# Patient Record
Sex: Male | Born: 1935 | Race: White | Hispanic: No | Marital: Married | Smoking: Never smoker
Health system: Southern US, Community
[De-identification: ages and names within clinical notes are randomized; demographics above are authoritative.]

## PROBLEM LIST (undated history)

## (undated) DIAGNOSIS — I4891 Unspecified atrial fibrillation: Secondary | ICD-10-CM

## (undated) DIAGNOSIS — I499 Cardiac arrhythmia, unspecified: Secondary | ICD-10-CM

## (undated) DIAGNOSIS — Z973 Presence of spectacles and contact lenses: Secondary | ICD-10-CM

## (undated) DIAGNOSIS — Z950 Presence of cardiac pacemaker: Secondary | ICD-10-CM

## (undated) DIAGNOSIS — M1990 Unspecified osteoarthritis, unspecified site: Secondary | ICD-10-CM

## (undated) DIAGNOSIS — R001 Bradycardia, unspecified: Secondary | ICD-10-CM

## (undated) DIAGNOSIS — I10 Essential (primary) hypertension: Secondary | ICD-10-CM

## (undated) DIAGNOSIS — R413 Other amnesia: Secondary | ICD-10-CM

## (undated) DIAGNOSIS — Z974 Presence of external hearing-aid: Secondary | ICD-10-CM

## (undated) DIAGNOSIS — G4730 Sleep apnea, unspecified: Secondary | ICD-10-CM

## (undated) DIAGNOSIS — E785 Hyperlipidemia, unspecified: Secondary | ICD-10-CM

## (undated) HISTORY — PX: TONSILLECTOMY: SUR1361

## (undated) HISTORY — PX: CARDIOVERSION: SHX1299

## (undated) HISTORY — DX: Other amnesia: R41.3

## (undated) HISTORY — DX: Bradycardia, unspecified: R00.1

## (undated) HISTORY — PX: COLONOSCOPY: SHX174

---

## 1999-03-30 ENCOUNTER — Other Ambulatory Visit: Admission: RE | Admit: 1999-03-30 | Discharge: 1999-03-30 | Payer: Self-pay | Admitting: Orthopedic Surgery

## 1999-10-13 ENCOUNTER — Encounter: Payer: Self-pay | Admitting: *Deleted

## 1999-10-13 ENCOUNTER — Encounter: Admission: RE | Admit: 1999-10-13 | Discharge: 1999-10-13 | Payer: Self-pay | Admitting: *Deleted

## 2000-02-05 ENCOUNTER — Inpatient Hospital Stay (HOSPITAL_COMMUNITY): Admission: EM | Admit: 2000-02-05 | Discharge: 2000-02-07 | Payer: Self-pay | Admitting: Emergency Medicine

## 2000-02-05 ENCOUNTER — Encounter: Payer: Self-pay | Admitting: Emergency Medicine

## 2000-02-05 ENCOUNTER — Encounter: Payer: Self-pay | Admitting: Pediatrics

## 2000-02-06 ENCOUNTER — Encounter: Payer: Self-pay | Admitting: Pediatrics

## 2000-09-26 ENCOUNTER — Ambulatory Visit (HOSPITAL_BASED_OUTPATIENT_CLINIC_OR_DEPARTMENT_OTHER): Admission: RE | Admit: 2000-09-26 | Discharge: 2000-09-26 | Payer: Self-pay | Admitting: Plastic Surgery

## 2002-01-08 HISTORY — PX: SHOULDER ARTHROSCOPY: SHX128

## 2002-08-21 ENCOUNTER — Encounter: Admission: RE | Admit: 2002-08-21 | Discharge: 2002-08-21 | Payer: Self-pay | Admitting: Internal Medicine

## 2002-08-21 ENCOUNTER — Encounter: Payer: Self-pay | Admitting: Internal Medicine

## 2002-08-28 ENCOUNTER — Ambulatory Visit (HOSPITAL_COMMUNITY): Admission: RE | Admit: 2002-08-28 | Discharge: 2002-08-28 | Payer: Self-pay | Admitting: Cardiology

## 2002-11-27 ENCOUNTER — Ambulatory Visit (HOSPITAL_COMMUNITY): Admission: RE | Admit: 2002-11-27 | Discharge: 2002-11-27 | Payer: Self-pay | Admitting: Cardiology

## 2003-05-04 ENCOUNTER — Ambulatory Visit (HOSPITAL_BASED_OUTPATIENT_CLINIC_OR_DEPARTMENT_OTHER): Admission: RE | Admit: 2003-05-04 | Discharge: 2003-05-04 | Payer: Self-pay | Admitting: Internal Medicine

## 2003-05-04 ENCOUNTER — Encounter: Payer: Self-pay | Admitting: Internal Medicine

## 2004-04-17 ENCOUNTER — Ambulatory Visit: Payer: Self-pay | Admitting: Internal Medicine

## 2004-05-08 ENCOUNTER — Ambulatory Visit (HOSPITAL_COMMUNITY): Admission: RE | Admit: 2004-05-08 | Discharge: 2004-05-08 | Payer: Self-pay | Admitting: Gastroenterology

## 2005-01-08 HISTORY — PX: THUMB ARTHROSCOPY: SHX2509

## 2005-12-05 ENCOUNTER — Encounter: Admission: RE | Admit: 2005-12-05 | Discharge: 2005-12-05 | Payer: Self-pay | Admitting: Orthopedic Surgery

## 2005-12-06 ENCOUNTER — Ambulatory Visit (HOSPITAL_BASED_OUTPATIENT_CLINIC_OR_DEPARTMENT_OTHER): Admission: RE | Admit: 2005-12-06 | Discharge: 2005-12-06 | Payer: Self-pay | Admitting: Orthopedic Surgery

## 2005-12-07 ENCOUNTER — Encounter (INDEPENDENT_AMBULATORY_CARE_PROVIDER_SITE_OTHER): Payer: Self-pay | Admitting: Specialist

## 2007-01-09 HISTORY — PX: PACEMAKER INSERTION: SHX728

## 2007-04-04 ENCOUNTER — Encounter: Admission: RE | Admit: 2007-04-04 | Discharge: 2007-04-04 | Payer: Self-pay | Admitting: Cardiology

## 2007-04-10 ENCOUNTER — Inpatient Hospital Stay (HOSPITAL_COMMUNITY): Admission: AD | Admit: 2007-04-10 | Discharge: 2007-04-11 | Payer: Self-pay | Admitting: Cardiology

## 2008-01-15 ENCOUNTER — Encounter: Admission: RE | Admit: 2008-01-15 | Discharge: 2008-01-15 | Payer: Self-pay | Admitting: Orthopaedic Surgery

## 2008-01-20 ENCOUNTER — Encounter: Admission: RE | Admit: 2008-01-20 | Discharge: 2008-01-20 | Payer: Self-pay | Admitting: Orthopaedic Surgery

## 2008-02-09 HISTORY — PX: KNEE ARTHROSCOPY: SHX127

## 2008-02-10 ENCOUNTER — Ambulatory Visit (HOSPITAL_BASED_OUTPATIENT_CLINIC_OR_DEPARTMENT_OTHER): Admission: RE | Admit: 2008-02-10 | Discharge: 2008-02-10 | Payer: Self-pay | Admitting: Orthopaedic Surgery

## 2008-07-21 ENCOUNTER — Telehealth (INDEPENDENT_AMBULATORY_CARE_PROVIDER_SITE_OTHER): Payer: Self-pay | Admitting: *Deleted

## 2008-07-23 ENCOUNTER — Ambulatory Visit: Payer: Self-pay | Admitting: Internal Medicine

## 2008-07-23 DIAGNOSIS — G4733 Obstructive sleep apnea (adult) (pediatric): Secondary | ICD-10-CM

## 2008-07-23 DIAGNOSIS — I4891 Unspecified atrial fibrillation: Secondary | ICD-10-CM

## 2008-07-23 DIAGNOSIS — E785 Hyperlipidemia, unspecified: Secondary | ICD-10-CM

## 2008-07-26 ENCOUNTER — Encounter: Payer: Self-pay | Admitting: Internal Medicine

## 2008-09-15 ENCOUNTER — Encounter: Payer: Self-pay | Admitting: Internal Medicine

## 2009-01-11 ENCOUNTER — Encounter: Payer: Self-pay | Admitting: Internal Medicine

## 2010-02-07 NOTE — Letter (Signed)
Summary: CMN for CPAP supplies/[facility]  CMN for CPAP supplies/[facility]   Imported By: Sherian Rein 01/14/2009 11:19:17  _____________________________________________________________________  External Attachment:    Type:   Image     Comment:   External Document

## 2010-03-29 ENCOUNTER — Other Ambulatory Visit: Payer: Self-pay | Admitting: Dermatology

## 2010-04-25 LAB — BASIC METABOLIC PANEL
Chloride: 111 mEq/L (ref 96–112)
GFR calc non Af Amer: >60 mL/min (ref >60)
Potassium: 4.4 mEq/L (ref 3.5–5.1)
Sodium: 142 mEq/L (ref 135–145)

## 2010-04-25 LAB — PROTIME-INR
INR: 1.2 (ref 0.00–1.49)
Prothrombin Time: 15.1 seconds (ref 11.6–15.2)

## 2010-04-25 LAB — APTT: aPTT: 28 seconds (ref 24–37)

## 2010-04-25 LAB — POCT HEMOGLOBIN-HEMACUE: Hemoglobin: 15.1 g/dL (ref 13.0–17.0)

## 2010-05-23 NOTE — Op Note (Signed)
NAME:  Dean Morgan, Dean Morgan NO.:  [id number]   MEDICAL RECORD NO.:  [id number]          PATIENT TYPE:  AMB   LOCATION:  DSC                          FACILITY:  [facility]   PHYSICIAN:  Lubertha Basque. Dalldorf, M.D.DATE OF BIRTH:  1931-01-15   DATE OF PROCEDURE:  02/10/2008  DATE OF DISCHARGE:                               OPERATIVE REPORT   PREOPERATIVE DIAGNOSES:  1. Left knee torn medial meniscus.  2. Left knee chondromalacia.   POSTOPERATIVE DIAGNOSES:  1. Left knee torn medial and torn lateral menisci.  2. Left knee degenerative joint disease.   PROCEDURE:  1. Left knee arthroscopic partial medial and partial lateral      meniscectomy.  2. Left knee arthroscopic chondroplasty and abrasion.   ANESTHESIA:  Knee block MAC.   ATTENDING SURGEON:  Lubertha Basque. Jerl Santos, MD   ASSISTANT:  Lindwood Qua, PA   INDICATIONS FOR PROCEDURE:  The patient is a 77 year old man with a long  history of left knee pain.  This has persisted despite rest.  He cannot  really take oral anti-inflammatories due to concomitant Coumadin  administration.  By CT arthrogram, he has a posterior horn medial  meniscus tear and suspicion of some degenerative changes.  He has pain  which limits his ability to rest and walk and he is offered a knee  arthroscopy.  Informed operative consent was obtained after discussion  of possible complications including reaction to anesthesia and  infection.   SUMMARY FINDINGS AND PROCEDURE:  Under knee block and MAC, an  arthroscopy of the left knee was performed.  Suprapatellar pouch was  benign while the patellofemoral joint exhibited grade 3 and grade 4  change addressed with a thorough chondroplasty along with abrasion to  bleeding bone at some tiny areas of the intertrochlear groove.  Medial  compartment did exhibit a degenerative tear of the posterior horn of the  medial meniscus consistent with his scan.  About 10-15% partial medial  meniscectomy was done  with basket and shavers.  He also had some grade 3  change across broad areas and small areas of grade 4 change and a  thorough chondroplasty was done here.  Lateral compartment exhibited a  small free edge tear addressed with a tiny partial lateral meniscectomy.  The ACL was intact.   DESCRIPTION OF PROCEDURE:  The patient was brought to the operating  suite where knee block and MAC were applied.  He was positioned supine  and prepped and draped in normal sterile fashion.  After the  administration of IV vancomycin, an arthroscopy of the left knee was  performed through a total of 2 portals.  Findings were as noted above  and procedure consisted of chondroplasty of patellofemoral in medial  with an abrasion to bleeding bone in the intertrochlear groove.  We then  performed a partial medial meniscectomy with basket and shaver and in  the partial lateral with the same instruments.  The knee was thoroughly  irrigated, followed by placement of Marcaine with epinephrine and  morphine plus some Depo-Medrol.  Adaptic was applied followed by dry  gauze and  loose Ace wrap.  Estimated blood loss and intraoperative  fluids obtained from anesthesia records.   DISPOSITION:  The patient was extubated in the operating room and taken  to recovery in stable addition.  He is to the discharged home same-day  follow up in less than a week.  I will contact him by phone tonight.      Lubertha Basque Jerl Santos, M.D.  Electronically Signed     PGD/MEDQ  D:  02/10/2008  T:  02/11/2008  Job:  [id number]

## 2010-05-23 NOTE — Discharge Summary (Signed)
NAME:  Dean Morgan, Dean Morgan NO.:  [id number]   MEDICAL RECORD NO.:  [id number]          PATIENT TYPE:  INP   LOCATION:  4710                         FACILITY:  [facility]   PHYSICIAN:  Abelino Derrick, P.A.   DATE OF BIRTH:  1931/07/27   DATE OF ADMISSION:  04/10/2007  DATE OF DISCHARGE:                               DISCHARGE SUMMARY   DISCHARGE DIAGNOSES:  1. Paroxysmal atrial fibrillation and sick sinus syndrome, status post      elective pacemaker implant at this admission by Dr. Lynnea Ferrier.  2. Treated hypertension.  3. Treated dyslipidemia.  4. Angiotensin-converting enzyme intolerance secondary to cough.   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  He had had previous  cardioversion in the past, but was unable to maintain sinus rhythm.  He  has normal LV function.  He has had no ischemia on a previous nuclear  study in September 2004.  He was seen in the office on March 20, 2007,  and had complained of some intermittent slow rates, and just not  feeling right.  A CardioNet monitor was obtained, and he frequently had  rates in the 30s.  He was seen back by Dr. Clarene Duke on April 02, 2007, and  was set up for an elective pacemaker implant.  This was done by Dr.  Lynnea Ferrier on April 10, 2007.  A Medtronic device was implanted.  Dr.  Lynnea Ferrier was unable to place the device on the left and it was placed on  the right chest.  The patient tolerated the procedure well.  He V-paced.  We feel he can be discharged later on April 11, 2007, pending his chest x-  ray results.  He will need to resume his Coumadin, and the timing of  this will be discussed.   DISCHARGE MEDICATIONS:  1. Benicar 20 mg a day.  2. Crestor 20 mg a day.  3. Fish oil.  4. Potassium 20 mEq every other day.  5. HCTZ 25 mg a day.  6. Niaspan 1 g h.s.  7. Aspirin 81 mg a day.  8. Coumadin 4 mg a day or as directed.  9. Vitamin D.   LABORATORY DATA:  Chest x-ray is pending at discharge.  INR at admission  was 1.2.  Chest x-ray on April 04, 2007, showed no active disease.   DISPOSITION:  The patient will be discharged in stable condition.  The  timing of Coumadin will be discussed with the MD.  He will follow up  next week in the office for a site check.     Abelino Derrick, P.ALenard Lance  D:  04/11/2007  T:  04/12/2007  Job:  [id number]

## 2010-05-23 NOTE — Op Note (Signed)
NAME:  Dean Morgan, Dean Morgan NO.:  [id number]   MEDICAL RECORD NO.:  [id number]          PATIENT TYPE:  OIB   LOCATION:  2807                         FACILITY:  [facility]   PHYSICIAN:  Ritta Slot, MD     DATE OF BIRTH:  07/31/1931   DATE OF PROCEDURE:  DATE OF DISCHARGE:                               OPERATIVE REPORT   PROCEDURE:  Insertion of a Medtronic Adapta ADSR 01, serial number  [id number], with active ventricular lead, Medtronic lead 507, 52-cm,  serial number [id number].   INDICATIONS:  Dean Morgan is a 77 year old male patient of Dr. Gaspar Garbe B.  Little, with a history of chronic atrial fibrillation back to 2004, with  multiple cardioversions, unable to sustain sinus rhythm.  He comes in  now with generalized weakness with a heart rate of 35, and atrial  fibrillation with longest pause about 3 seconds.  He therefore was  brought to [facility] cath lab for an insertion of a single-chamber  permanent pacemaker.   DESCRIPTION OF PROCEDURE:  After informed consent, the patient's left  anterior wall was shaved and prepped and draped in sterile fashion.  EC  monitor was established.  1% lidocaine was then used to anesthetize the  left mid subclavicular area.  Next, the proximal number 3-cm mid sub  clavicular incision was then carried out, hemostasis obtained with  cautery.  Blunt dissection used to carry this down to the left  pectoralis fascia.  Again, hemostasis obtained with electrocautery.  Next, approximately 3 x 4-cm pocket was created left pectoral fascia.  The left subclavian vein was not unable to be entered with a retained  guidewire.  Venogram was performed, which demonstrated occluded left  subclavian vein with small tortuous collateral.  It was therefore  decided to close the pocket with 2-0 Vicryl sutures to the bottom and 4-  0 Vicryl sutures superficially and attempt to enter the subclavian  venous system through the right side.   The right anterior  chest was then prepped and draped in sterile fashion.  EC monitor remained established.  1% lidocaine was then used to  anesthetize the right mid subclavicular area.  Next, the right  subclavian vein was accessed using a retained guidewire under  fluoroscopy.  Next, once this was established, approximately a 3-cm mid  subclavicular incision was then carried out and hemostasis obtained with  electrocautery.  Blunt dissection was used to carry this down to the  left pectoralis fascia, the right pectoral fascia, again hemostasis  obtained with electrocautery.  Next, approximately 3 x 4-cm pocket was  created, right pectoral fascia.  The right subclavian vein was then over  the guidewire a 7-French sheath was introduced into the right subclavian  vein right.  Through the dilated sheath the guidewire and dilator were  then removed, then through the sheath a 52-cm Medtronic 5076 model,  serial number [id number], active ventricular lead was then placed into  the right atrium.  The peel-away sheath was then removed.  The right  ventricular lead was then placed into the right ventricle with  fluoroscopic guidance.  We  were able to capture 2 volts.  The screw was  then extended, the thresholds determined.  R waves measured were 12.1  mv.  Impedance was 727 ohms.  Threshold in the ventricle was 0.5 v at  0.5 ms.  Current was 0.8 mA.  10 volts negative for diaphragmatic  stimulation.  The two silk sutures were then used to anchor these to the  pectoral fascia.  Again, hemostasis was obtained with electrocautery.  Continued oozing was noted.  Surgi-Strips were placed into the pocket  and electrocautery continued up until the hemostasis was obtained.  The  lead was then connected in serial fashion to a Medtronic Adapta ADSR 01,  serial number [id number].  Head screws were tightened and the patient  was confirmed taut.  Single suture placed at the apex pocket and the  generator leads were then delivered  to the pocket.  Head was secured  with silk suture, subcutaneously was then closed using 2-0 Vicryl.  The  skin was then closed with 4-0 Vicryl.  Steri-Strips were applied and a  pressure dressing applied.  The patient then returned to the recovery  room in stable condition.   CONCLUSION:  Successful implant of Medtronic Adapta ADSR [id number], serial  number [id number], with active ventricular lead, model number Medtronic  [id number], serial number [id number].   He has an occluded left subclavian vein and attempts to enter his venous  system through this should be avoided.  Many thanks.      Ritta Slot, MD  Electronically Signed     HS/MEDQ  D:  04/10/2007  T:  04/10/2007  Job:  [id number]

## 2010-05-26 NOTE — Op Note (Signed)
NAME:  DAARON, DIMARCO NO.:  [id number]   MEDICAL RECORD NO.:  [id number]          PATIENT TYPE:  AMB   LOCATION:  DSC                          FACILITY:  [facility]   PHYSICIAN:  Katy Fitch. Sypher, M.D. DATE OF BIRTH:  [birth year]   DATE OF PROCEDURE:  12/06/2005  DATE OF DISCHARGE:                               OPERATIVE REPORT   PREOPERATIVE DIAGNOSIS:  1. Painful mass right thumb ulnar pulp adjacent to IP flexion crease.  2. 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   POSTOP DIAGNOSIS:  1. Painful mass right thumb ulnar pulp adjacent to IP flexion crease.  2. 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   OPERATION:  1. Debridement of epidermal inclusion cyst right thumb with      identification of pacinian corpuscle, hypertrophy, and probable      neuroma in continuity of ulnar proper digital nerve deep to      epidermal cyst right thumb.  2. Mucoid cyst right long finger with marginal osteophytes dorsal      ulnar aspect right long finger DIP joint.   OPERATION:  1. Resection of epidermal cyst right thumb ulnar pulp.  2. Debridement of mucoid cyst and DIP joint debridement with      synovectomy osteophyte excision right long finger.   SURGEON:  Josephine Igo, M.D.  No assistant.   ANESTHESIA:  Is 2% lidocaine metacarpal head level block of right thumb  and right long finger supplemented by IV sedation, supervising  anesthesiologist Dr. Jairo Ben.   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  He has a history of  hypertrophic osteoarthritis.  He is on chronic Coumadin anticoagulation  for atrial fibrillation.  With the consent of his cardiologist he has  been withdrawn from his Coumadin and is brought to the operating room at  this time to remedy his thumb and long  finger predicaments   After informed consent he is brought to the operating room at this time.   Understands that both predicaments can recur despite our best efforts to  manage them.  He is aware of the potential risks, benefits surgery  including anesthetic risk, bleeding risk, possible infection and failure  to relieve all discomfort.   PROCEDURE:  [name] is brought to operating room and placed in  supine position on the table.   Following light sedation the right arm was prepped with Betadine soap  solution, sterilely draped.  After alcohol prep, 2% lidocaine was  infiltrated at the base of the right long finger to obtain digital block  and likewise at the metacarpal head region of the thumb to obtain a  distal thumb block.   After 10 minutes, anesthesia satisfactory in both digits.   The right long finger was exsanguinated with gauze wrap and a 1/2-inch  Penrose drain used at the base of the finger as a digital tourniquet.   The procedure commenced with a  curvilinear incision beginning at the  cyst and extending towards the ulnar joint line.  The subcutaneous  tissues were carefully divided revealing a multilobular cyst extending  from the dorsal ulnar aspect of the long finger DIP joint.  The cyst was  excised from beneath the skin and the cyst heading into the joint  capsule was likewise excised with a fine rongeur.  The joint was entered  after capsulotomy between the extensor tendon and the ulnar collateral  ligament.  Marginal osteophytes on the head of the middle phalanx and  the base of the distal phalanx were resected with a micro curette and a  micro rongeur.  The wound was then thoroughly lavaged with sterile  saline.   The wound was closed with trauma sutures of 5-0 nylon.  The wound was  dressed with Xeroflo, sterile gauze and a Coban dressing.   Attention directed to the thumb.  After exsanguination of the thumb with  a gauze wrap the half inch Penrose  drain was placed on the thumb as  digital tourniquet.  The mass was exposed through a ulnar mid lateral  incision centered above the IP flexion crease.  The subcutaneous tissues  were carefully divided taking care to identify the ulnar proper digital  nerve and it's terminal branches.  Deep to the mass there was noted be  multiple pacinian corpuscles that were very hypertrophic.  These were  gently dissected away from the mass with a Therapist, nutritional.  It is  possible that the mass was causing irritation of the nerve elements.   There was thickening of the ulnar proper digital nerve central branch  which may also been due to local pressure due to the epidermal cyst.  The mass of the cyst was about 5-6 mm diameter.  This was resected with  a rongeur and the center of the mass was a firm skin pearl typical of  epidermal inclusion cyst.   After all of the pathologic material was debrided, the wound was  irrigated.  Hemostasis achieved with bipolar cautery followed by repair  of the skin with trauma suture of 5-0 nylon.   There were no apparent complications.   Dr. Lawerance Bach' thumb was dressed with Lyda Perone, sterile gauze and Coban.  Tourniquet released.  There were no apparent complications.   He will be discharged with prescription for Vicodin 5 mg one to two  tablets p.o. q.4-6 hours p.r.n. pain 20 tablets without refill.   He will return to see me in the office for follow-up in 1 week or sooner  if problems.Marland Kitchen  He will resume his Coumadin today and resume all his  routine medications.      Katy Fitch Sypher, M.D.  Electronically Signed     RVS/MEDQ  D:  12/06/2005  T:  12/06/2005  Job:  [id number]

## 2010-05-26 NOTE — Cardiovascular Report (Signed)
NAME:  KEO, SCHIRMER [name]                     ACCOUNT NO.:  [id number]   MEDICAL RECORD NO.:  [id number]                   PATIENT TYPE:  OIB   LOCATION:  2859                                 FACILITY:  [facility]   PHYSICIAN:  Thereasa Solo. Little, M.D.              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  He underwent elective cardioversion in August that  was successful for only two weeks.  He converted back into atrial  fibrillation.  He has had a slow ventricular response with no medications,  but even when he first went into atrial fibrillation.  Since his  unsuccessful long term cardioversion in August, he was placed on Rhythmol SR  225 mg b.i.d.  With this, he has had continued bradycardia with rates 48-56,  but no symptoms.  He works out regularly and is not lightheaded or having  any syncope or presyncopal events.   He is brought in today after six weeks of Rhythmol therapy in an attempt to  try to convert him electrically into a sinus rhythm with the intention being  that the Cardizem will hopefully prevent recurrence of the atrial  fibrillation.   After obtaining an appropriate level of sedation with 100 mg of IV Diprivan  by anesthesiology, the patient underwent elective cardioversion.   300 watt seconds using anterior posterior pads resulted in conversion into  sinus bradycardia rate 48-52.  His blood pressure following the  cardioversion is 161/94.  He will be discharged to home later today.  Medications remain the same.  Will re-evaluate in the office next week.   CONCLUSION:  Successful short term cardioversion.                                               Thereasa Solo. Little, M.D.    ABL/MEDQ  D:  11/27/2002  T:  11/27/2002  Job:  [id number]   cc:   Wilson Singer, M.D.  [address]  [place]  [place] [address]  Fax: [phone]

## 2010-10-03 LAB — PROTIME-INR: Prothrombin Time: 15.9 — ABNORMAL HIGH

## 2011-01-10 ENCOUNTER — Other Ambulatory Visit: Payer: Self-pay | Admitting: Dermatology

## 2011-01-10 DIAGNOSIS — D2370 Other benign neoplasm of skin of unspecified lower limb, including hip: Secondary | ICD-10-CM | POA: Diagnosis not present

## 2011-01-10 DIAGNOSIS — L719 Rosacea, unspecified: Secondary | ICD-10-CM | POA: Diagnosis not present

## 2011-01-10 DIAGNOSIS — D485 Neoplasm of uncertain behavior of skin: Secondary | ICD-10-CM | POA: Diagnosis not present

## 2011-01-10 DIAGNOSIS — Z85828 Personal history of other malignant neoplasm of skin: Secondary | ICD-10-CM | POA: Diagnosis not present

## 2011-01-10 DIAGNOSIS — L570 Actinic keratosis: Secondary | ICD-10-CM | POA: Diagnosis not present

## 2011-01-18 DIAGNOSIS — M79609 Pain in unspecified limb: Secondary | ICD-10-CM | POA: Diagnosis not present

## 2011-01-18 DIAGNOSIS — I8310 Varicose veins of unspecified lower extremity with inflammation: Secondary | ICD-10-CM | POA: Diagnosis not present

## 2011-01-25 DIAGNOSIS — E785 Hyperlipidemia, unspecified: Secondary | ICD-10-CM | POA: Diagnosis not present

## 2011-01-25 DIAGNOSIS — E291 Testicular hypofunction: Secondary | ICD-10-CM | POA: Diagnosis not present

## 2011-01-25 DIAGNOSIS — I4891 Unspecified atrial fibrillation: Secondary | ICD-10-CM | POA: Diagnosis not present

## 2011-01-25 DIAGNOSIS — Z125 Encounter for screening for malignant neoplasm of prostate: Secondary | ICD-10-CM | POA: Diagnosis not present

## 2011-01-25 DIAGNOSIS — I10 Essential (primary) hypertension: Secondary | ICD-10-CM | POA: Diagnosis not present

## 2011-02-01 DIAGNOSIS — E785 Hyperlipidemia, unspecified: Secondary | ICD-10-CM | POA: Diagnosis not present

## 2011-02-01 DIAGNOSIS — Z7901 Long term (current) use of anticoagulants: Secondary | ICD-10-CM | POA: Diagnosis not present

## 2011-02-01 DIAGNOSIS — I4891 Unspecified atrial fibrillation: Secondary | ICD-10-CM | POA: Diagnosis not present

## 2011-02-01 DIAGNOSIS — Z0000 Encounter for general adult medical examination without abnormal findings: Secondary | ICD-10-CM | POA: Diagnosis not present

## 2011-02-01 DIAGNOSIS — Z125 Encounter for screening for malignant neoplasm of prostate: Secondary | ICD-10-CM | POA: Diagnosis not present

## 2011-02-01 DIAGNOSIS — I10 Essential (primary) hypertension: Secondary | ICD-10-CM | POA: Diagnosis not present

## 2011-02-02 DIAGNOSIS — I8310 Varicose veins of unspecified lower extremity with inflammation: Secondary | ICD-10-CM | POA: Diagnosis not present

## 2011-02-02 DIAGNOSIS — M79609 Pain in unspecified limb: Secondary | ICD-10-CM | POA: Diagnosis not present

## 2011-02-02 DIAGNOSIS — Z1212 Encounter for screening for malignant neoplasm of rectum: Secondary | ICD-10-CM | POA: Diagnosis not present

## 2011-02-20 ENCOUNTER — Other Ambulatory Visit: Payer: Self-pay | Admitting: Gastroenterology

## 2011-02-20 DIAGNOSIS — K648 Other hemorrhoids: Secondary | ICD-10-CM | POA: Diagnosis not present

## 2011-02-20 DIAGNOSIS — Z1211 Encounter for screening for malignant neoplasm of colon: Secondary | ICD-10-CM | POA: Diagnosis not present

## 2011-02-20 DIAGNOSIS — Z800 Family history of malignant neoplasm of digestive organs: Secondary | ICD-10-CM | POA: Diagnosis not present

## 2011-02-20 DIAGNOSIS — K5730 Diverticulosis of large intestine without perforation or abscess without bleeding: Secondary | ICD-10-CM | POA: Diagnosis not present

## 2011-02-20 DIAGNOSIS — D126 Benign neoplasm of colon, unspecified: Secondary | ICD-10-CM | POA: Diagnosis not present

## 2011-03-08 DIAGNOSIS — Z7901 Long term (current) use of anticoagulants: Secondary | ICD-10-CM | POA: Diagnosis not present

## 2011-03-08 DIAGNOSIS — I4891 Unspecified atrial fibrillation: Secondary | ICD-10-CM | POA: Diagnosis not present

## 2011-03-27 DIAGNOSIS — M65839 Other synovitis and tenosynovitis, unspecified forearm: Secondary | ICD-10-CM | POA: Diagnosis not present

## 2011-03-27 DIAGNOSIS — M25549 Pain in joints of unspecified hand: Secondary | ICD-10-CM | POA: Diagnosis not present

## 2011-03-27 DIAGNOSIS — M65849 Other synovitis and tenosynovitis, unspecified hand: Secondary | ICD-10-CM | POA: Diagnosis not present

## 2011-04-03 DIAGNOSIS — Z7901 Long term (current) use of anticoagulants: Secondary | ICD-10-CM | POA: Diagnosis not present

## 2011-04-03 DIAGNOSIS — I4891 Unspecified atrial fibrillation: Secondary | ICD-10-CM | POA: Diagnosis not present

## 2011-04-09 DIAGNOSIS — I495 Sick sinus syndrome: Secondary | ICD-10-CM | POA: Diagnosis not present

## 2011-04-09 DIAGNOSIS — I4891 Unspecified atrial fibrillation: Secondary | ICD-10-CM | POA: Diagnosis not present

## 2011-04-30 DIAGNOSIS — H903 Sensorineural hearing loss, bilateral: Secondary | ICD-10-CM | POA: Diagnosis not present

## 2011-05-04 DIAGNOSIS — R05 Cough: Secondary | ICD-10-CM | POA: Diagnosis not present

## 2011-05-04 DIAGNOSIS — Z7901 Long term (current) use of anticoagulants: Secondary | ICD-10-CM | POA: Diagnosis not present

## 2011-05-04 DIAGNOSIS — J209 Acute bronchitis, unspecified: Secondary | ICD-10-CM | POA: Diagnosis not present

## 2011-05-04 DIAGNOSIS — R0982 Postnasal drip: Secondary | ICD-10-CM | POA: Diagnosis not present

## 2011-05-08 DIAGNOSIS — Z7901 Long term (current) use of anticoagulants: Secondary | ICD-10-CM | POA: Diagnosis not present

## 2011-05-08 DIAGNOSIS — I4891 Unspecified atrial fibrillation: Secondary | ICD-10-CM | POA: Diagnosis not present

## 2011-05-10 ENCOUNTER — Other Ambulatory Visit: Payer: Self-pay | Admitting: Dermatology

## 2011-05-10 DIAGNOSIS — L821 Other seborrheic keratosis: Secondary | ICD-10-CM | POA: Diagnosis not present

## 2011-05-10 DIAGNOSIS — D485 Neoplasm of uncertain behavior of skin: Secondary | ICD-10-CM | POA: Diagnosis not present

## 2011-05-10 DIAGNOSIS — L570 Actinic keratosis: Secondary | ICD-10-CM | POA: Diagnosis not present

## 2011-06-12 DIAGNOSIS — I4891 Unspecified atrial fibrillation: Secondary | ICD-10-CM | POA: Diagnosis not present

## 2011-06-12 DIAGNOSIS — N529 Male erectile dysfunction, unspecified: Secondary | ICD-10-CM | POA: Diagnosis not present

## 2011-06-12 DIAGNOSIS — R5383 Other fatigue: Secondary | ICD-10-CM | POA: Diagnosis not present

## 2011-06-12 DIAGNOSIS — Z79899 Other long term (current) drug therapy: Secondary | ICD-10-CM | POA: Diagnosis not present

## 2011-06-12 DIAGNOSIS — E782 Mixed hyperlipidemia: Secondary | ICD-10-CM | POA: Diagnosis not present

## 2011-06-12 DIAGNOSIS — Z7901 Long term (current) use of anticoagulants: Secondary | ICD-10-CM | POA: Diagnosis not present

## 2011-06-29 DIAGNOSIS — I4891 Unspecified atrial fibrillation: Secondary | ICD-10-CM | POA: Diagnosis not present

## 2011-06-29 DIAGNOSIS — I10 Essential (primary) hypertension: Secondary | ICD-10-CM | POA: Diagnosis not present

## 2011-07-04 DIAGNOSIS — K219 Gastro-esophageal reflux disease without esophagitis: Secondary | ICD-10-CM | POA: Diagnosis not present

## 2011-07-04 DIAGNOSIS — J310 Chronic rhinitis: Secondary | ICD-10-CM | POA: Diagnosis not present

## 2011-07-04 DIAGNOSIS — J330 Polyp of nasal cavity: Secondary | ICD-10-CM | POA: Diagnosis not present

## 2011-07-04 DIAGNOSIS — R439 Unspecified disturbances of smell and taste: Secondary | ICD-10-CM | POA: Diagnosis not present

## 2011-07-05 DIAGNOSIS — Z7901 Long term (current) use of anticoagulants: Secondary | ICD-10-CM | POA: Diagnosis not present

## 2011-07-05 DIAGNOSIS — I4891 Unspecified atrial fibrillation: Secondary | ICD-10-CM | POA: Diagnosis not present

## 2011-08-06 DIAGNOSIS — J209 Acute bronchitis, unspecified: Secondary | ICD-10-CM | POA: Diagnosis not present

## 2011-08-06 DIAGNOSIS — I4891 Unspecified atrial fibrillation: Secondary | ICD-10-CM | POA: Diagnosis not present

## 2011-08-06 DIAGNOSIS — R0982 Postnasal drip: Secondary | ICD-10-CM | POA: Diagnosis not present

## 2011-08-06 DIAGNOSIS — Z7901 Long term (current) use of anticoagulants: Secondary | ICD-10-CM | POA: Diagnosis not present

## 2011-08-06 DIAGNOSIS — M25549 Pain in joints of unspecified hand: Secondary | ICD-10-CM | POA: Diagnosis not present

## 2011-09-11 DIAGNOSIS — Z7901 Long term (current) use of anticoagulants: Secondary | ICD-10-CM | POA: Diagnosis not present

## 2011-09-11 DIAGNOSIS — I4891 Unspecified atrial fibrillation: Secondary | ICD-10-CM | POA: Diagnosis not present

## 2011-09-19 DIAGNOSIS — I4891 Unspecified atrial fibrillation: Secondary | ICD-10-CM | POA: Diagnosis not present

## 2011-09-19 DIAGNOSIS — I495 Sick sinus syndrome: Secondary | ICD-10-CM | POA: Diagnosis not present

## 2011-09-27 DIAGNOSIS — Z7901 Long term (current) use of anticoagulants: Secondary | ICD-10-CM | POA: Diagnosis not present

## 2011-09-27 DIAGNOSIS — I4891 Unspecified atrial fibrillation: Secondary | ICD-10-CM | POA: Diagnosis not present

## 2011-09-27 DIAGNOSIS — Z23 Encounter for immunization: Secondary | ICD-10-CM | POA: Diagnosis not present

## 2011-10-22 DIAGNOSIS — I10 Essential (primary) hypertension: Secondary | ICD-10-CM | POA: Diagnosis not present

## 2011-11-01 DIAGNOSIS — Z7901 Long term (current) use of anticoagulants: Secondary | ICD-10-CM | POA: Diagnosis not present

## 2011-11-01 DIAGNOSIS — I4891 Unspecified atrial fibrillation: Secondary | ICD-10-CM | POA: Diagnosis not present

## 2011-11-22 DIAGNOSIS — Z7901 Long term (current) use of anticoagulants: Secondary | ICD-10-CM | POA: Diagnosis not present

## 2011-11-22 DIAGNOSIS — I4891 Unspecified atrial fibrillation: Secondary | ICD-10-CM | POA: Diagnosis not present

## 2011-11-30 DIAGNOSIS — M25549 Pain in joints of unspecified hand: Secondary | ICD-10-CM | POA: Diagnosis not present

## 2011-12-12 DIAGNOSIS — Z7901 Long term (current) use of anticoagulants: Secondary | ICD-10-CM | POA: Diagnosis not present

## 2011-12-12 DIAGNOSIS — I4891 Unspecified atrial fibrillation: Secondary | ICD-10-CM | POA: Diagnosis not present

## 2011-12-31 DIAGNOSIS — H2510 Age-related nuclear cataract, unspecified eye: Secondary | ICD-10-CM | POA: Diagnosis not present

## 2011-12-31 DIAGNOSIS — M25549 Pain in joints of unspecified hand: Secondary | ICD-10-CM | POA: Diagnosis not present

## 2012-01-10 ENCOUNTER — Other Ambulatory Visit: Payer: Self-pay | Admitting: Dermatology

## 2012-01-10 DIAGNOSIS — D239 Other benign neoplasm of skin, unspecified: Secondary | ICD-10-CM | POA: Diagnosis not present

## 2012-01-10 DIAGNOSIS — D485 Neoplasm of uncertain behavior of skin: Secondary | ICD-10-CM | POA: Diagnosis not present

## 2012-01-10 DIAGNOSIS — L719 Rosacea, unspecified: Secondary | ICD-10-CM | POA: Diagnosis not present

## 2012-01-10 DIAGNOSIS — L821 Other seborrheic keratosis: Secondary | ICD-10-CM | POA: Diagnosis not present

## 2012-01-10 DIAGNOSIS — C44319 Basal cell carcinoma of skin of other parts of face: Secondary | ICD-10-CM | POA: Diagnosis not present

## 2012-01-10 DIAGNOSIS — Z85828 Personal history of other malignant neoplasm of skin: Secondary | ICD-10-CM | POA: Diagnosis not present

## 2012-01-10 DIAGNOSIS — L570 Actinic keratosis: Secondary | ICD-10-CM | POA: Diagnosis not present

## 2012-01-17 DIAGNOSIS — I872 Venous insufficiency (chronic) (peripheral): Secondary | ICD-10-CM | POA: Diagnosis not present

## 2012-01-17 DIAGNOSIS — I8310 Varicose veins of unspecified lower extremity with inflammation: Secondary | ICD-10-CM | POA: Diagnosis not present

## 2012-01-18 ENCOUNTER — Other Ambulatory Visit: Payer: Self-pay | Admitting: Orthopaedic Surgery

## 2012-01-24 ENCOUNTER — Encounter (HOSPITAL_BASED_OUTPATIENT_CLINIC_OR_DEPARTMENT_OTHER): Payer: Self-pay | Admitting: *Deleted

## 2012-01-24 NOTE — Progress Notes (Signed)
Pt has been her many times-last for knee 2010-has pacer-form faxed to dr croitoru To come in for pt,ptt bmet day before surgery Will use his cpap post op

## 2012-01-25 NOTE — H&P (Signed)
Dean Morgan is an 79 y.o. male.   Chief Complaint: index finger left hand pain HPI: Dean Morgan has a cyst on his left index finger MCP joint.  It is very painful whenever he hits it.  He is at the point that it causes him problems on a daily basis and he would like to have it removed.  He has had this joint injected earlier this year with minimal benefit  .x-ray: There is a bony prominence at the metacarpal just proximal to this joint.  Past Medical History  Diagnosis Date  . Hypertension   . Dysrhythmia     AF  . Pacemaker   . Hyperlipemia   . Arthritis   . Wears glasses   . Wears hearing aid   . Sleep apnea     does use his cpap    Past Surgical History  Procedure Date  . Tonsillectomy   . Thumb arthroscopy 2007    rt thunb mass  . Shoulder arthroscopy 2004    left  . Knee arthroscopy 2/10    left  . Pacemaker insertion 2009  . Cardioversion     multiple times-pacer put in 2009  . Colonoscopy     History reviewed. No pertinent family history. Social History:  reports that he has never smoked. He does not have any smokeless tobacco history on file. He reports that he drinks alcohol. He reports that he does not use illicit drugs.  Allergies:  Allergies  Allergen Reactions  . Lipitor (Atorvastatin)     Muscle pain  . Penicillins     REACTION: rash  . Vytorin (Ezetimibe-Simvastatin)     Muscle pain    No prescriptions prior to admission    No results found for this or any previous visit (from the past 48 hour(s)). No results found.  Review of Systems  Constitutional: Negative.   HENT: Negative.   Eyes: Negative.   Respiratory: Negative.   Cardiovascular: Positive for palpitations.  Gastrointestinal: Negative.   Genitourinary: Negative.   Musculoskeletal: Negative.   Skin: Negative.   Neurological: Negative.   Endo/Heme/Allergies: Negative.   Psychiatric/Behavioral: Negative.     Height 5\' 9"  (1.753 m), weight 83.915 kg (185 lb). Physical Exam    Constitutional: He is oriented to person, place, and time. He appears well-nourished.  HENT:  Head: Atraumatic.  Eyes: Pupils are equal, round, and reactive to light.  Neck: Normal range of motion.  Cardiovascular: Normal heart sounds.   Respiratory: Effort normal and breath sounds normal.  GI: Bowel sounds are normal.  Musculoskeletal:       Left second MCP joint has a prominent spur on the radial aspect that is very tender to palpation.  Range of motion of the joint is good.  Sensation and motor function are intact in his hands with good capillary refill to his fingertips.  Neurological: He is oriented to person, place, and time.  Skin: Skin is dry.  Psychiatric: He has a normal mood and affect.     Assessment/Plan Assessment: Left second finger MCP spur Plan: Dean Morgan would like to have this prominence from his left second MCP joint removed.  We have discussed the risks of anesthesia and infection related to removal of this mass.  He will be off his Coumadin for 5 days prior to surgery and then restart it that evening.  Dariusz Brase R 01/25/2012, 11:59 AM

## 2012-01-28 ENCOUNTER — Encounter (HOSPITAL_BASED_OUTPATIENT_CLINIC_OR_DEPARTMENT_OTHER)
Admission: RE | Admit: 2012-01-28 | Discharge: 2012-01-28 | Disposition: A | Discharge status: Home / Self Care | Payer: Medicare Other | Source: Ambulatory Visit | Attending: Orthopaedic Surgery | Admitting: Orthopaedic Surgery

## 2012-01-28 DIAGNOSIS — E785 Hyperlipidemia, unspecified: Secondary | ICD-10-CM | POA: Diagnosis not present

## 2012-01-28 DIAGNOSIS — Z950 Presence of cardiac pacemaker: Secondary | ICD-10-CM | POA: Diagnosis not present

## 2012-01-28 DIAGNOSIS — G4730 Sleep apnea, unspecified: Secondary | ICD-10-CM | POA: Diagnosis not present

## 2012-01-28 DIAGNOSIS — M898X9 Other specified disorders of bone, unspecified site: Secondary | ICD-10-CM | POA: Diagnosis not present

## 2012-01-28 DIAGNOSIS — I4891 Unspecified atrial fibrillation: Secondary | ICD-10-CM | POA: Diagnosis not present

## 2012-01-28 DIAGNOSIS — Z01812 Encounter for preprocedural laboratory examination: Secondary | ICD-10-CM | POA: Diagnosis not present

## 2012-01-28 DIAGNOSIS — I10 Essential (primary) hypertension: Secondary | ICD-10-CM | POA: Diagnosis not present

## 2012-01-28 LAB — BASIC METABOLIC PANEL
CO2: 26 mEq/L (ref 19–32)
Calcium: 9.6 mg/dL (ref 8.4–10.5)
Chloride: 107 mEq/L (ref 96–112)
Glucose, Bld: 110 mg/dL — ABNORMAL HIGH (ref 70–99)
Sodium: 143 mEq/L (ref 135–145)

## 2012-01-28 LAB — PROTIME-INR
INR: 1.38 (ref 0.00–1.49)
Prothrombin Time: 16.6 seconds — ABNORMAL HIGH (ref 11.6–15.2)

## 2012-01-28 LAB — APTT: aPTT: 33 seconds (ref 24–37)

## 2012-01-29 ENCOUNTER — Encounter (HOSPITAL_BASED_OUTPATIENT_CLINIC_OR_DEPARTMENT_OTHER): Payer: Self-pay | Admitting: *Deleted

## 2012-01-29 ENCOUNTER — Encounter (HOSPITAL_BASED_OUTPATIENT_CLINIC_OR_DEPARTMENT_OTHER)
Admission: RE | Disposition: A | Discharge status: Home / Self Care | Payer: Self-pay | Source: Ambulatory Visit | Attending: Orthopaedic Surgery

## 2012-01-29 ENCOUNTER — Ambulatory Visit (HOSPITAL_BASED_OUTPATIENT_CLINIC_OR_DEPARTMENT_OTHER): Payer: Medicare Other | Admitting: *Deleted

## 2012-01-29 ENCOUNTER — Ambulatory Visit (HOSPITAL_BASED_OUTPATIENT_CLINIC_OR_DEPARTMENT_OTHER)
Admission: RE | Admit: 2012-01-29 | Discharge: 2012-01-29 | Disposition: A | Discharge status: Home / Self Care | Payer: Medicare Other | Source: Ambulatory Visit | Attending: Orthopaedic Surgery | Admitting: Orthopaedic Surgery

## 2012-01-29 ENCOUNTER — Encounter (HOSPITAL_BASED_OUTPATIENT_CLINIC_OR_DEPARTMENT_OTHER): Payer: Self-pay

## 2012-01-29 DIAGNOSIS — G4730 Sleep apnea, unspecified: Secondary | ICD-10-CM | POA: Insufficient documentation

## 2012-01-29 DIAGNOSIS — I4891 Unspecified atrial fibrillation: Secondary | ICD-10-CM | POA: Diagnosis not present

## 2012-01-29 DIAGNOSIS — M898X9 Other specified disorders of bone, unspecified site: Secondary | ICD-10-CM | POA: Diagnosis not present

## 2012-01-29 DIAGNOSIS — I10 Essential (primary) hypertension: Secondary | ICD-10-CM | POA: Insufficient documentation

## 2012-01-29 DIAGNOSIS — E785 Hyperlipidemia, unspecified: Secondary | ICD-10-CM | POA: Diagnosis not present

## 2012-01-29 DIAGNOSIS — Z950 Presence of cardiac pacemaker: Secondary | ICD-10-CM | POA: Insufficient documentation

## 2012-01-29 DIAGNOSIS — Z01812 Encounter for preprocedural laboratory examination: Secondary | ICD-10-CM | POA: Insufficient documentation

## 2012-01-29 DIAGNOSIS — M19049 Primary osteoarthritis, unspecified hand: Secondary | ICD-10-CM

## 2012-01-29 HISTORY — PX: MASS EXCISION: SHX2000

## 2012-01-29 HISTORY — DX: Presence of spectacles and contact lenses: Z97.3

## 2012-01-29 HISTORY — DX: Presence of cardiac pacemaker: Z95.0

## 2012-01-29 HISTORY — DX: Cardiac arrhythmia, unspecified: I49.9

## 2012-01-29 HISTORY — DX: Presence of external hearing-aid: Z97.4

## 2012-01-29 HISTORY — DX: Unspecified osteoarthritis, unspecified site: M19.90

## 2012-01-29 HISTORY — DX: Sleep apnea, unspecified: G47.30

## 2012-01-29 HISTORY — DX: Hyperlipidemia, unspecified: E78.5

## 2012-01-29 HISTORY — DX: Essential (primary) hypertension: I10

## 2012-01-29 LAB — POCT HEMOGLOBIN-HEMACUE: Hemoglobin: 15.9 g/dL (ref 13.0–17.0)

## 2012-01-29 SURGERY — EXCISION MASS
Anesthesia: Regional | Site: Hand | Laterality: Left | Wound class: Clean

## 2012-01-29 MED ORDER — ONDANSETRON HCL 4 MG/2ML IJ SOLN
INTRAMUSCULAR | Status: DC | PRN
  Administered 2012-01-29: 4 mg via INTRAVENOUS

## 2012-01-29 MED ORDER — VANCOMYCIN HCL IN DEXTROSE 1-5 GM/200ML-% IV SOLN
1000.0000 mg | INTRAVENOUS | Status: AC
  Administered 2012-01-29: 1000 mg via INTRAVENOUS

## 2012-01-29 MED ORDER — OXYCODONE-ACETAMINOPHEN 5-325 MG PO TABS: 1.0000 | ORAL_TABLET | ORAL | Status: DC | PRN

## 2012-01-29 MED ORDER — PROPOFOL 10 MG/ML IV BOLUS
INTRAVENOUS | Status: DC | PRN
  Administered 2012-01-29 (×3): 20 mg via INTRAVENOUS

## 2012-01-29 MED ORDER — BUPIVACAINE HCL (PF) 0.5 % IJ SOLN
INTRAMUSCULAR | Status: DC | PRN
  Administered 2012-01-29: 4 mL

## 2012-01-29 MED ORDER — FENTANYL CITRATE 0.05 MG/ML IJ SOLN
INTRAMUSCULAR | Status: DC | PRN
  Administered 2012-01-29: 50 ug via INTRAVENOUS

## 2012-01-29 MED ORDER — LACTATED RINGERS IV SOLN
INTRAVENOUS | Status: DC
  Administered 2012-01-29: 09:00:00 via INTRAVENOUS

## 2012-01-29 MED ORDER — LIDOCAINE HCL (PF) 0.5 % IJ SOLN
INTRAMUSCULAR | Status: DC | PRN
  Administered 2012-01-29: 30 mL via INTRAVENOUS

## 2012-01-29 SURGICAL SUPPLY — 54 items
APL SKNCLS STERI-STRIP NONHPOA (GAUZE/BANDAGES/DRESSINGS)
BENZOIN TINCTURE PRP APPL 2/3 (GAUZE/BANDAGES/DRESSINGS) IMPLANT
BLADE MINI RND TIP GREEN BEAV (BLADE) IMPLANT
BLADE SURG 15 STRL LF DISP TIS (BLADE) ×1 IMPLANT
BLADE SURG 15 STRL SS (BLADE) ×2
BNDG CMPR 9X4 STRL LF SNTH (GAUZE/BANDAGES/DRESSINGS)
BNDG COHESIVE 1X5 TAN STRL LF (GAUZE/BANDAGES/DRESSINGS) ×2 IMPLANT
BNDG ESMARK 4X9 LF (GAUZE/BANDAGES/DRESSINGS) IMPLANT
COVER MAYO STAND STRL (DRAPES) ×2 IMPLANT
COVER TABLE BACK 60X90 (DRAPES) ×2 IMPLANT
CUFF TOURNIQUET SINGLE 18IN (TOURNIQUET CUFF) ×1 IMPLANT
CUFF TOURNIQUET SINGLE 24IN (TOURNIQUET CUFF) IMPLANT
DECANTER SPIKE VIAL GLASS SM (MISCELLANEOUS) IMPLANT
DRAPE EXTREMITY T 121X128X90 (DRAPE) ×2 IMPLANT
DRSG EMULSION OIL 3X3 NADH (GAUZE/BANDAGES/DRESSINGS) ×2 IMPLANT
DURAPREP 26ML APPLICATOR (WOUND CARE) ×2 IMPLANT
ELECT NDL TIP 2.8 STRL (NEEDLE) IMPLANT
ELECT NEEDLE TIP 2.8 STRL (NEEDLE) IMPLANT
ELECT REM PT RETURN 9FT ADLT (ELECTROSURGICAL)
ELECTRODE REM PT RTRN 9FT ADLT (ELECTROSURGICAL) IMPLANT
GLOVE BIO SURGEON STRL SZ 6.5 (GLOVE) ×1 IMPLANT
GLOVE BIO SURGEON STRL SZ8.5 (GLOVE) ×2 IMPLANT
GLOVE BIOGEL PI IND STRL 8 (GLOVE) ×1 IMPLANT
GLOVE BIOGEL PI IND STRL 8.5 (GLOVE) ×1 IMPLANT
GLOVE BIOGEL PI INDICATOR 8 (GLOVE) ×1
GLOVE BIOGEL PI INDICATOR 8.5 (GLOVE) ×1
GLOVE SS BIOGEL STRL SZ 8 (GLOVE) ×1 IMPLANT
GLOVE SUPERSENSE BIOGEL SZ 8 (GLOVE) ×1
GOWN PREVENTION PLUS XLARGE (GOWN DISPOSABLE) ×3 IMPLANT
NDL HYPO 25X1 1.5 SAFETY (NEEDLE) IMPLANT
NEEDLE HYPO 25X1 1.5 SAFETY (NEEDLE) IMPLANT
NS IRRIG 1000ML POUR BTL (IV SOLUTION) ×2 IMPLANT
PACK BASIN DAY SURGERY FS (CUSTOM PROCEDURE TRAY) ×2 IMPLANT
PADDING CAST ABS 4INX4YD NS (CAST SUPPLIES) ×1
PADDING CAST ABS COTTON 4X4 ST (CAST SUPPLIES) ×1 IMPLANT
PENCIL BUTTON HOLSTER BLD 10FT (ELECTRODE) IMPLANT
SHEET MEDIUM DRAPE 40X70 STRL (DRAPES) IMPLANT
SPLINT PLASTER CAST XFAST 3X15 (CAST SUPPLIES) IMPLANT
SPLINT PLASTER XTRA FASTSET 3X (CAST SUPPLIES)
SPONGE GAUZE 4X4 12PLY (GAUZE/BANDAGES/DRESSINGS) ×2 IMPLANT
STOCKINETTE 4X48 STRL (DRAPES) ×2 IMPLANT
STRIP CLOSURE SKIN 1/4X4 (GAUZE/BANDAGES/DRESSINGS) IMPLANT
SUT ETHILON 4 0 PS 2 18 (SUTURE) IMPLANT
SUT ETHILON 5 0 P 3 18 (SUTURE)
SUT ETHILON 5 0 PS 2 18 (SUTURE) IMPLANT
SUT NYLON ETHILON 5-0 P-3 1X18 (SUTURE) IMPLANT
SUT VIC AB 4-0 P-3 18XBRD (SUTURE) IMPLANT
SUT VIC AB 4-0 P3 18 (SUTURE)
SYR BULB 3OZ (MISCELLANEOUS) IMPLANT
SYR CONTROL 10ML LL (SYRINGE) IMPLANT
TOWEL OR 17X24 6PK STRL BLUE (TOWEL DISPOSABLE) ×4 IMPLANT
TOWEL OR NON WOVEN STRL DISP B (DISPOSABLE) ×2 IMPLANT
UNDERPAD 30X30 INCONTINENT (UNDERPADS AND DIAPERS) ×2 IMPLANT
WATER STERILE IRR 1000ML POUR (IV SOLUTION) ×2 IMPLANT

## 2012-01-29 NOTE — Op Note (Signed)
#  [id number] 

## 2012-01-29 NOTE — Anesthesia Postprocedure Evaluation (Signed)
Anesthesia Post Note  Patient: Dean Morgan  Procedure(s) Performed: Procedure(s) (LRB): EXCISION MASS (Left)  Anesthesia type: general  Patient location: PACU  Post pain: Pain level controlled  Post assessment: Patient's Cardiovascular Status Stable  Last Vitals:  Filed Vitals:   01/29/12 1100  BP: 140/84  Pulse: 60  Temp:   Resp: 18    Post vital signs: Reviewed and stable  Level of consciousness: sedated  Complications: No apparent anesthesia complications

## 2012-01-29 NOTE — Transfer of Care (Signed)
Immediate Anesthesia Transfer of Care Note  Patient: Dean Morgan  Procedure(s) Performed: Procedure(s) (LRB) with comments: EXCISION MASS (Left) - excision of bone spur left hand  Patient Location: PACU  Anesthesia Type:Bier block  Level of Consciousness: awake, alert  and oriented  Airway & Oxygen Therapy: Patient Spontanous Breathing and Patient connected to face mask oxygen  Post-op Assessment: Report given to PACU RN, Post -op Vital signs reviewed and stable and Patient moving all extremities  Post vital signs: Reviewed and stable  Complications: No apparent anesthesia complications

## 2012-01-29 NOTE — Anesthesia Preprocedure Evaluation (Addendum)
Anesthesia Evaluation  Patient identified by MRN, date of birth, ID band Patient awake    Reviewed: Allergy & Precautions, H&P , NPO status , Patient's Chart, lab work & pertinent test results  Airway Mallampati: I TM Distance: >3 FB Neck ROM: Full    Dental   Pulmonary sleep apnea ,          Cardiovascular hypertension, Pt. on medications + dysrhythmias Atrial Fibrillation + pacemaker     Neuro/Psych    GI/Hepatic   Endo/Other    Renal/GU      Musculoskeletal   Abdominal   Peds  Hematology   Anesthesia Other Findings   Reproductive/Obstetrics                           Anesthesia Physical Anesthesia Plan  ASA: III  Anesthesia Plan: Bier Block   Post-op Pain Management:    Induction:   Airway Management Planned: Natural Airway  Additional Equipment:   Intra-op Plan:   Post-operative Plan:   Informed Consent: I have reviewed the patients History and Physical, chart, labs and discussed the procedure including the risks, benefits and alternatives for the proposed anesthesia with the patient or authorized representative who has indicated his/her understanding and acceptance.     Plan Discussed with: CRNA and Surgeon  Anesthesia Plan Comments:         Anesthesia Quick Evaluation

## 2012-01-29 NOTE — Interval H&P Note (Signed)
History and Physical Interval Note:  01/29/2012 8:37 AM  Dean Morgan  has presented today for surgery, with the diagnosis of LEFT HAND MASS  The various methods of treatment have been discussed with the patient and family. After consideration of risks, benefits and other options for treatment, the patient has consented to  Procedure(s) (LRB) with comments: EXCISION MASS (Left) as a surgical intervention .  The patient's history has been reviewed, patient examined, no change in status, stable for surgery.  I have reviewed the patient's chart and labs.  Questions were answered to the patient's satisfaction.     Kross Swallows G

## 2012-01-30 ENCOUNTER — Encounter (HOSPITAL_BASED_OUTPATIENT_CLINIC_OR_DEPARTMENT_OTHER): Payer: Self-pay | Admitting: Orthopaedic Surgery

## 2012-01-30 NOTE — Op Note (Signed)
NAME:  RAFFI, MILSTEIN NO.:  [id number]  MEDICAL RECORD NO.:  [id number]  LOCATION:                                 FACILITY:  PHYSICIAN:  Lubertha Basque. Victorio Creeden, M.D.DATE OF BIRTH:  [birth year]  DATE OF PROCEDURE:  01/29/2012 DATE OF DISCHARGE:                              OPERATIVE REPORT   POSTOPERATIVE DIAGNOSIS:  Left index MCP spur.  POSTOPERATIVE DIAGNOSIS:  Left index MCP spur.  PROCEDURE:  Excision spur left index MCP joint.  ANESTHESIA: Bier block and MAC.  ATTENDING SURGEON:  Lubertha Basque. Jerl Santos, M.D.  ASSISTANT:  Lindwood Qua, P.A.  INDICATION FOR PROCEDURE:  The patient is a 79 year old man with a long history of a very painful area at the base of his left index finger. When he strikes this, it is horribly painful.  He has tried padding it and bracing but persists with this horrible pain.  On x-ray, he has a very large spur emanating from the metacarpal head proximal to the joint which itself does not look terribly degenerative.  He is offered a simple excision of the spur.  Informed operative consent was obtained after discussion of possible complications including reaction to anesthesia, infection, neurovascular injury.  SUMMARY OF FINDINGS AND PROCEDURE:  Under Bier block and some sedation through a small incision, he underwent excision of this bony prominence. We used fluoroscopy to confirm adequate resection.  He was closed primarily and discharged home.  DESCRIPTION OF PROCEDURE:  The patient was taken to the operating suite where a Bier block was applied along with some sedation.  He was positioned supine and prepped and draped in normal sterile fashion.  He stayed awake throughout the case.  After administration of preop IV vancomycin and appropriate time out, a small incision was made over the prominent area.  Dissection was carried down through the capsule to expose a very prominent spur bone.  Fluoroscopy was used to localize this  appropriately and then it was simply removed with a rongeur. Fluoroscopy was again used to confirm adequate resection.  The area was thoroughly irrigated followed by reapproximation of the capsule with interrupted sutures of Vicryl.  The tourniquet was deflated and skin edges bled well.  I then reapproximated the skin with vertical mattresses of nylon suture.  We injected some Marcaine about the incision site followed by Xeroform and a dry gauze dressing with loose Ace wrap.  Estimated blood loss and fluids obtained from anesthesia records as can accurate tourniquet time.  DISPOSITION:  The patient was taken to recovery in stable addition.  He was to go home same-day and follow up in the office closely.  I will contact him by phone tonight.  He will resume his Coumadin tonight.     Lubertha Basque Jerl Santos, M.D.     PGD/MEDQ  D:  01/29/2012  T:  01/29/2012  Job:  [id number]

## 2012-01-31 DIAGNOSIS — E785 Hyperlipidemia, unspecified: Secondary | ICD-10-CM | POA: Diagnosis not present

## 2012-01-31 DIAGNOSIS — Z125 Encounter for screening for malignant neoplasm of prostate: Secondary | ICD-10-CM | POA: Diagnosis not present

## 2012-01-31 DIAGNOSIS — Z0000 Encounter for general adult medical examination without abnormal findings: Secondary | ICD-10-CM | POA: Diagnosis not present

## 2012-01-31 DIAGNOSIS — E291 Testicular hypofunction: Secondary | ICD-10-CM | POA: Diagnosis not present

## 2012-01-31 DIAGNOSIS — I10 Essential (primary) hypertension: Secondary | ICD-10-CM | POA: Diagnosis not present

## 2012-02-01 DIAGNOSIS — C44319 Basal cell carcinoma of skin of other parts of face: Secondary | ICD-10-CM | POA: Diagnosis not present

## 2012-02-07 DIAGNOSIS — Z7901 Long term (current) use of anticoagulants: Secondary | ICD-10-CM | POA: Diagnosis not present

## 2012-02-07 DIAGNOSIS — I4891 Unspecified atrial fibrillation: Secondary | ICD-10-CM | POA: Diagnosis not present

## 2012-02-08 DIAGNOSIS — Z125 Encounter for screening for malignant neoplasm of prostate: Secondary | ICD-10-CM | POA: Diagnosis not present

## 2012-02-08 DIAGNOSIS — Z0000 Encounter for general adult medical examination without abnormal findings: Secondary | ICD-10-CM | POA: Diagnosis not present

## 2012-02-08 DIAGNOSIS — E291 Testicular hypofunction: Secondary | ICD-10-CM | POA: Diagnosis not present

## 2012-02-08 DIAGNOSIS — I10 Essential (primary) hypertension: Secondary | ICD-10-CM | POA: Diagnosis not present

## 2012-02-08 DIAGNOSIS — R5383 Other fatigue: Secondary | ICD-10-CM | POA: Diagnosis not present

## 2012-02-08 DIAGNOSIS — I4891 Unspecified atrial fibrillation: Secondary | ICD-10-CM | POA: Diagnosis not present

## 2012-02-11 DIAGNOSIS — Z1212 Encounter for screening for malignant neoplasm of rectum: Secondary | ICD-10-CM | POA: Diagnosis not present

## 2012-02-20 DIAGNOSIS — I4891 Unspecified atrial fibrillation: Secondary | ICD-10-CM | POA: Diagnosis not present

## 2012-02-20 DIAGNOSIS — Z7901 Long term (current) use of anticoagulants: Secondary | ICD-10-CM | POA: Diagnosis not present

## 2012-02-27 DIAGNOSIS — M898X9 Other specified disorders of bone, unspecified site: Secondary | ICD-10-CM | POA: Diagnosis not present

## 2012-02-29 DIAGNOSIS — M898X9 Other specified disorders of bone, unspecified site: Secondary | ICD-10-CM | POA: Diagnosis not present

## 2012-03-17 DIAGNOSIS — M898X9 Other specified disorders of bone, unspecified site: Secondary | ICD-10-CM | POA: Diagnosis not present

## 2012-03-19 DIAGNOSIS — I4891 Unspecified atrial fibrillation: Secondary | ICD-10-CM | POA: Diagnosis not present

## 2012-03-20 DIAGNOSIS — M25549 Pain in joints of unspecified hand: Secondary | ICD-10-CM | POA: Diagnosis not present

## 2012-03-24 DIAGNOSIS — M898X9 Other specified disorders of bone, unspecified site: Secondary | ICD-10-CM | POA: Diagnosis not present

## 2012-03-28 DIAGNOSIS — M25549 Pain in joints of unspecified hand: Secondary | ICD-10-CM | POA: Diagnosis not present

## 2012-03-28 DIAGNOSIS — M898X9 Other specified disorders of bone, unspecified site: Secondary | ICD-10-CM | POA: Diagnosis not present

## 2012-03-31 DIAGNOSIS — M898X9 Other specified disorders of bone, unspecified site: Secondary | ICD-10-CM | POA: Diagnosis not present

## 2012-03-31 DIAGNOSIS — M25549 Pain in joints of unspecified hand: Secondary | ICD-10-CM | POA: Diagnosis not present

## 2012-04-04 DIAGNOSIS — M898X9 Other specified disorders of bone, unspecified site: Secondary | ICD-10-CM | POA: Diagnosis not present

## 2012-04-04 DIAGNOSIS — M25549 Pain in joints of unspecified hand: Secondary | ICD-10-CM | POA: Diagnosis not present

## 2012-04-07 DIAGNOSIS — R040 Epistaxis: Secondary | ICD-10-CM | POA: Diagnosis not present

## 2012-04-07 DIAGNOSIS — I4891 Unspecified atrial fibrillation: Secondary | ICD-10-CM | POA: Diagnosis not present

## 2012-04-07 DIAGNOSIS — Z7901 Long term (current) use of anticoagulants: Secondary | ICD-10-CM | POA: Diagnosis not present

## 2012-04-07 DIAGNOSIS — Z5181 Encounter for therapeutic drug level monitoring: Secondary | ICD-10-CM | POA: Diagnosis not present

## 2012-04-07 DIAGNOSIS — J3489 Other specified disorders of nose and nasal sinuses: Secondary | ICD-10-CM | POA: Diagnosis not present

## 2012-04-08 DIAGNOSIS — J3489 Other specified disorders of nose and nasal sinuses: Secondary | ICD-10-CM | POA: Diagnosis not present

## 2012-04-08 DIAGNOSIS — H905 Unspecified sensorineural hearing loss: Secondary | ICD-10-CM | POA: Diagnosis not present

## 2012-04-09 DIAGNOSIS — Z7901 Long term (current) use of anticoagulants: Secondary | ICD-10-CM | POA: Diagnosis not present

## 2012-04-09 DIAGNOSIS — I4891 Unspecified atrial fibrillation: Secondary | ICD-10-CM | POA: Diagnosis not present

## 2012-04-10 ENCOUNTER — Other Ambulatory Visit: Payer: Self-pay | Admitting: Otolaryngology

## 2012-04-10 DIAGNOSIS — H905 Unspecified sensorineural hearing loss: Secondary | ICD-10-CM

## 2012-04-10 LAB — BUN: BUN: 23 mg/dL (ref 6–23)

## 2012-04-11 DIAGNOSIS — M25549 Pain in joints of unspecified hand: Secondary | ICD-10-CM | POA: Diagnosis not present

## 2012-04-11 DIAGNOSIS — M898X9 Other specified disorders of bone, unspecified site: Secondary | ICD-10-CM | POA: Diagnosis not present

## 2012-04-12 DIAGNOSIS — I4891 Unspecified atrial fibrillation: Secondary | ICD-10-CM | POA: Diagnosis not present

## 2012-04-16 ENCOUNTER — Ambulatory Visit
Admission: RE | Admit: 2012-04-16 | Discharge: 2012-04-16 | Disposition: A | Discharge status: Home / Self Care | Payer: Medicare Other | Source: Ambulatory Visit | Attending: Otolaryngology | Admitting: Otolaryngology

## 2012-04-16 DIAGNOSIS — H905 Unspecified sensorineural hearing loss: Secondary | ICD-10-CM | POA: Diagnosis not present

## 2012-04-16 DIAGNOSIS — M25549 Pain in joints of unspecified hand: Secondary | ICD-10-CM | POA: Diagnosis not present

## 2012-04-16 MED ORDER — IOHEXOL 300 MG/ML  SOLN
75.0000 mL | Freq: Once | INTRAMUSCULAR | Status: AC | PRN
  Administered 2012-04-16: 75 mL via INTRAVENOUS

## 2012-04-18 DIAGNOSIS — M898X9 Other specified disorders of bone, unspecified site: Secondary | ICD-10-CM | POA: Diagnosis not present

## 2012-04-18 DIAGNOSIS — M25549 Pain in joints of unspecified hand: Secondary | ICD-10-CM | POA: Diagnosis not present

## 2012-04-21 DIAGNOSIS — M25549 Pain in joints of unspecified hand: Secondary | ICD-10-CM | POA: Diagnosis not present

## 2012-04-21 DIAGNOSIS — M898X9 Other specified disorders of bone, unspecified site: Secondary | ICD-10-CM | POA: Diagnosis not present

## 2012-04-22 DIAGNOSIS — Z7901 Long term (current) use of anticoagulants: Secondary | ICD-10-CM | POA: Diagnosis not present

## 2012-04-22 DIAGNOSIS — I4891 Unspecified atrial fibrillation: Secondary | ICD-10-CM | POA: Diagnosis not present

## 2012-04-24 DIAGNOSIS — M25549 Pain in joints of unspecified hand: Secondary | ICD-10-CM | POA: Diagnosis not present

## 2012-04-24 DIAGNOSIS — M898X9 Other specified disorders of bone, unspecified site: Secondary | ICD-10-CM | POA: Diagnosis not present

## 2012-05-02 DIAGNOSIS — M25549 Pain in joints of unspecified hand: Secondary | ICD-10-CM | POA: Diagnosis not present

## 2012-05-05 DIAGNOSIS — M898X9 Other specified disorders of bone, unspecified site: Secondary | ICD-10-CM | POA: Diagnosis not present

## 2012-05-05 DIAGNOSIS — M25549 Pain in joints of unspecified hand: Secondary | ICD-10-CM | POA: Diagnosis not present

## 2012-05-09 DIAGNOSIS — M25549 Pain in joints of unspecified hand: Secondary | ICD-10-CM | POA: Diagnosis not present

## 2012-05-09 DIAGNOSIS — M898X9 Other specified disorders of bone, unspecified site: Secondary | ICD-10-CM | POA: Diagnosis not present

## 2012-05-12 DIAGNOSIS — M898X9 Other specified disorders of bone, unspecified site: Secondary | ICD-10-CM | POA: Diagnosis not present

## 2012-05-12 DIAGNOSIS — M25549 Pain in joints of unspecified hand: Secondary | ICD-10-CM | POA: Diagnosis not present

## 2012-05-16 DIAGNOSIS — M898X9 Other specified disorders of bone, unspecified site: Secondary | ICD-10-CM | POA: Diagnosis not present

## 2012-05-16 DIAGNOSIS — M25549 Pain in joints of unspecified hand: Secondary | ICD-10-CM | POA: Diagnosis not present

## 2012-05-27 DIAGNOSIS — I4891 Unspecified atrial fibrillation: Secondary | ICD-10-CM | POA: Diagnosis not present

## 2012-05-27 DIAGNOSIS — Z7901 Long term (current) use of anticoagulants: Secondary | ICD-10-CM | POA: Diagnosis not present

## 2012-05-28 DIAGNOSIS — M25549 Pain in joints of unspecified hand: Secondary | ICD-10-CM | POA: Diagnosis not present

## 2012-05-28 DIAGNOSIS — M898X9 Other specified disorders of bone, unspecified site: Secondary | ICD-10-CM | POA: Diagnosis not present

## 2012-06-06 ENCOUNTER — Telehealth: Payer: Self-pay | Admitting: Cardiovascular Disease

## 2012-06-06 DIAGNOSIS — E782 Mixed hyperlipidemia: Secondary | ICD-10-CM

## 2012-06-06 DIAGNOSIS — Z79899 Other long term (current) drug therapy: Secondary | ICD-10-CM

## 2012-06-06 DIAGNOSIS — R5383 Other fatigue: Secondary | ICD-10-CM

## 2012-06-06 DIAGNOSIS — E349 Endocrine disorder, unspecified: Secondary | ICD-10-CM

## 2012-06-06 NOTE — Telephone Encounter (Signed)
Returned call.  Left message to call back.    Labs ordered per Henrene Pastor, CMA (Dr. Tresa Endo). CBC, CMP, LP, TSH at [facility].  Will inform pt when call returned.

## 2012-06-06 NOTE — Telephone Encounter (Signed)
Dr Lawerance Bach wants to know if he needs to have lab work before his appt next Thursday? If so please call and let him know!

## 2012-06-06 NOTE — Telephone Encounter (Signed)
Pt called back r/t labs.  Pt informed routine lab orders have been placed.  Pt asked if he could also have his testosterone checked and informed that would need to be approved by provider as that is not a routine lab.  Pt verbalized understanding and will plan to go to the lab on Tuesday of next week.  Pt can be reached at [phone] on Monday.    Dr. Tresa Endo is showing as provider and pt 's appt is with Dr. Royann Shivers.  Will forward message to Dr. Royann Shivers for further instructions regarding adding testosterone to lab order.

## 2012-06-07 NOTE — Telephone Encounter (Signed)
Please show me the paper chart on Monday. No useful info in epic about this

## 2012-06-09 DIAGNOSIS — R5381 Other malaise: Secondary | ICD-10-CM | POA: Diagnosis not present

## 2012-06-09 DIAGNOSIS — E782 Mixed hyperlipidemia: Secondary | ICD-10-CM | POA: Diagnosis not present

## 2012-06-09 DIAGNOSIS — E291 Testicular hypofunction: Secondary | ICD-10-CM | POA: Diagnosis not present

## 2012-06-09 DIAGNOSIS — Z79899 Other long term (current) drug therapy: Secondary | ICD-10-CM | POA: Diagnosis not present

## 2012-06-09 NOTE — Telephone Encounter (Signed)
Chart reviewed.  Per Dr. Royann Shivers, order lab for testosterone.  Call to pt at number requested and left message w/ pt's wife that labs were ordered.  Agreed to inform pt.

## 2012-06-10 DIAGNOSIS — E782 Mixed hyperlipidemia: Secondary | ICD-10-CM | POA: Diagnosis not present

## 2012-06-10 DIAGNOSIS — R5383 Other fatigue: Secondary | ICD-10-CM | POA: Diagnosis not present

## 2012-06-10 DIAGNOSIS — E291 Testicular hypofunction: Secondary | ICD-10-CM | POA: Diagnosis not present

## 2012-06-10 DIAGNOSIS — Z79899 Other long term (current) drug therapy: Secondary | ICD-10-CM | POA: Diagnosis not present

## 2012-06-10 LAB — COMPREHENSIVE METABOLIC PANEL
ALT: 17 U/L (ref 0–53)
AST: 30 U/L (ref 0–37)
Alkaline Phosphatase: 72 U/L (ref 39–117)
Sodium: 140 mEq/L (ref 135–145)
Total Bilirubin: 0.7 mg/dL (ref 0.3–1.2)
Total Protein: 6.8 g/dL (ref 6.0–8.3)

## 2012-06-10 LAB — CBC
MCH: 31.1 pg (ref 26.0–34.0)
MCHC: 34.7 g/dL (ref 30.0–36.0)
Platelets: 166 10*3/uL (ref 150–400)
RBC: 4.85 MIL/uL (ref 4.22–5.81)

## 2012-06-10 LAB — LIPID PANEL
LDL Cholesterol: 81 mg/dL (ref 0–99)
VLDL: 19 mg/dL (ref 0–40)

## 2012-06-10 LAB — TESTOSTERONE: Testosterone: 595 ng/dL (ref 300–890)

## 2012-06-12 ENCOUNTER — Encounter: Payer: Self-pay | Admitting: Cardiovascular Disease

## 2012-06-12 ENCOUNTER — Ambulatory Visit (INDEPENDENT_AMBULATORY_CARE_PROVIDER_SITE_OTHER): Payer: Medicare Other | Admitting: Cardiovascular Disease

## 2012-06-12 VITALS — BP 108/62 | HR 66 | Resp 16 | Ht 69.5 in | Wt 195.2 lb

## 2012-06-12 DIAGNOSIS — Z950 Presence of cardiac pacemaker: Secondary | ICD-10-CM | POA: Diagnosis not present

## 2012-06-12 DIAGNOSIS — E782 Mixed hyperlipidemia: Secondary | ICD-10-CM | POA: Diagnosis not present

## 2012-06-12 DIAGNOSIS — I4821 Permanent atrial fibrillation: Secondary | ICD-10-CM

## 2012-06-12 DIAGNOSIS — Z8673 Personal history of transient ischemic attack (TIA), and cerebral infarction without residual deficits: Secondary | ICD-10-CM

## 2012-06-12 DIAGNOSIS — E785 Hyperlipidemia, unspecified: Secondary | ICD-10-CM

## 2012-06-12 DIAGNOSIS — C44319 Basal cell carcinoma of skin of other parts of face: Secondary | ICD-10-CM | POA: Diagnosis not present

## 2012-06-12 DIAGNOSIS — I442 Atrioventricular block, complete: Secondary | ICD-10-CM

## 2012-06-12 DIAGNOSIS — I10 Essential (primary) hypertension: Secondary | ICD-10-CM

## 2012-06-12 DIAGNOSIS — L570 Actinic keratosis: Secondary | ICD-10-CM | POA: Diagnosis not present

## 2012-06-12 DIAGNOSIS — G4733 Obstructive sleep apnea (adult) (pediatric): Secondary | ICD-10-CM

## 2012-06-12 DIAGNOSIS — I4891 Unspecified atrial fibrillation: Secondary | ICD-10-CM

## 2012-06-12 MED ORDER — AMLODIPINE-OLMESARTAN 5-40 MG PO TABS: 1.0000 | ORAL_TABLET | Freq: Every day | ORAL | Status: DC

## 2012-06-12 NOTE — Assessment & Plan Note (Signed)
On appropriate warfarin anticoagulation

## 2012-06-12 NOTE — Assessment & Plan Note (Addendum)
Intolerant to statins, no convincing evidence of coronary or other vascular disorders; normal nuclear stress test in August of 2000 the left. On zetia and and niacin therapy: excellent lipid profile.

## 2012-06-12 NOTE — Assessment & Plan Note (Signed)
Pacemaker dependent. Single-chamber Medtronic Adapta ADD SR 1 implanted April 2009

## 2012-06-12 NOTE — Progress Notes (Signed)
Patient ID: Dean Morgan, male   DOB: 1931/11/15, 79 y.o.   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 He has only done well without any new serious health problems but complains of fatigue and feeling "cold all the time". Pacemaker interrogation has shown normal function with adequate heart rate adjustment for activity. The TSH that was just performed is within normal limits suggesting that he does not have hypothyroidism. His other complaint is of persistent nasal congestion which he states is constant dental with an increase in his dose of amlodipine about 8 months ago. He denies syncope dizziness palpitations, focal cortical deficits, bleeding problems major weight changes etc.    Allergies  Allergen Reactions  . Lipitor (Atorvastatin)     Muscle pain  . Penicillins     REACTION: rash  . Vytorin (Ezetimibe-Simvastatin)     Muscle pain    Current Outpatient Prescriptions  Medication Sig Dispense Refill  . aspirin 81 MG tablet Take 81 mg by mouth daily.      Marland Kitchen ezetimibe (ZETIA) 10 MG tablet Take 10 mg by mouth daily.      . fish oil-omega-3 fatty acids 1000 MG capsule Take 2 g by mouth daily.      Marland Kitchen glucosamine-chondroitin 500-400 MG tablet Take 1 tablet by mouth 3 (three) times daily.      . nebivolol (BYSTOLIC) 10 MG tablet Take 10 mg by mouth daily.      . niacin (SLO-NIACIN) 500 MG tablet Take 500 mg by mouth 2 (two) times daily with a meal.      . potassium chloride SA (K-DUR,KLOR-CON) 20 MEQ tablet Take 20 mEq by mouth daily. Takes mon,wed,fri      . testosterone (ANDROGEL) 50 MG/5GM GEL Place 5 g onto the skin daily.      Marland Kitchen warfarin (COUMADIN) 2 MG tablet Take 2 mg by mouth daily. Mon,wed,fri      . warfarin (COUMADIN) 4 MG tablet Take 4 mg by mouth daily. Other  days than mon,wed,fri      . amLODipine-olmesartan (AZOR) 5-40 MG per tablet Take 1 tablet by mouth daily.  90 tablet  3  . furosemide (LASIX) 20 MG tablet       . oxyCODONE-acetaminophen (ROXICET) 5-325 MG per tablet Take 1 tablet by mouth every 4 (four) hours as needed for pain.  35 tablet  0  . triamcinolone cream (KENALOG) 0.1 %        No current facility-administered medications for this visit.    Past Medical History  Diagnosis Date  . Hypertension   . Dysrhythmia     AF  . Pacemaker   . Hyperlipemia   . Arthritis   . Wears glasses   . Wears hearing aid   . Sleep apnea     does use his cpap    Past Surgical History  Procedure Laterality Date  . Tonsillectomy    . Thumb arthroscopy  2007    rt thunb mass  . Shoulder arthroscopy  2004    left  . Knee arthroscopy  2/10    left  . Pacemaker insertion  2009  . Cardioversion      multiple times-pacer put in 2009  . Colonoscopy    . Mass excision  01/29/2012    Procedure: EXCISION MASS;  Surgeon: Velna Ochs, MD;  Location: [facility];  Service: Orthopedics;  Laterality: Left;  excision of bone spur left hand    No family history on file.  History   Social History  . Marital Status: Married    Spouse Name: N/A    Number of Children: N/A  . Years of Education: N/A   Occupational History  . Not on file.   Social History Main Topics  . Smoking status: Never Smoker   . Smokeless tobacco: Not on file  . Alcohol Use: Yes     Comment: occ  . Drug Use: No  . Sexually Active:    Other Topics Concern  . Not on file   Social History Narrative  . No narrative on file    Review of systems: The patient specifically denies any chest pain at rest or with exertion, dyspnea at rest or with exertion, orthopnea, paroxysmal nocturnal dyspnea, syncope, palpitations, focal neurological deficits, intermittent claudication, lower extremity edema, unexplained weight gain, cough, hemoptysis or wheezing.    Note fatigue and cold intolerance.  The patient also denies abdominal pain, nausea, vomiting, dysphagia, diarrhea, constipation, polyuria, polydipsia, dysuria, hematuria, frequency, urgency, abnormal bleeding or bruising, fever, chills, unexpected weight changes, mood swings, change in skin or hair texture, change in voice quality, auditory or visual problems, allergic reactions or rashes, new musculoskeletal complaints other than usual "aches and pains".   PHYSICAL EXAM BP 108/62  Pulse 66  Resp 16  Ht 5' 9.5" (1.765 m)  Wt 195 lb 3.2 oz (88.542 kg)  BMI 28.42 kg/m2  General: Alert, oriented x3, no distress Head: no evidence of trauma, PERRL, EOMI, no exophtalmos or lid lag, no myxedema, no xanthelasma; normal ears, nose and oropharynx Neck: normal jugular venous pulsations and no hepatojugular reflux; brisk carotid pulses without delay and no carotid bruits Chest: clear to auscultation, no signs of consolidation by percussion or palpation, normal fremitus, symmetrical and full respiratory excursions Cardiovascular: normal position and quality of the apical impulse, regular rhythm, normal first and paradoxically split second heart sounds, no murmurs, rubs or gallops; L3 right subclavian pacemaker site Abdomen: no tenderness or distention, no masses by palpation, no abnormal pulsatility or arterial bruits, normal bowel sounds, no hepatosplenomegaly Extremities: no clubbing, cyanosis or edema; 2+ radial, ulnar and brachial pulses bilaterally; 2+ right femoral, posterior tibial and dorsalis pedis pulses; 2+ left femoral, posterior tibial and dorsalis pedis pulses; no subclavian or femoral bruits Neurological: grossly nonfocal   EKG: Background atrial fibrillation, ventricular pacing  Lipid Panel     Component Value Date/Time   CHOL 164 06/10/2012 0815   TRIG 94 06/10/2012 0815   HDL 64 06/10/2012 0815   CHOLHDL 2.6 06/10/2012 0815   VLDL 19 06/10/2012 0815   LDLCALC 81 06/10/2012 0815     BMET    Component Value Date/Time   NA 140 06/10/2012 0815   K 4.2 06/10/2012 0815   CL 107 06/10/2012 0815   CO2 26 06/10/2012 0815   GLUCOSE 78 06/10/2012 0815   BUN 18 06/10/2012 0815   CREATININE 0.98 06/10/2012 0815   CREATININE 0.84 01/28/2012 1200   CALCIUM 8.9 06/10/2012 0815   GFRNONAA 82* 01/28/2012 1200   GFRAA >90 01/28/2012 1200     ASSESSMENT AND PLAN HYPERLIPIDEMIA Intolerant to statins, no convincing evidence of coronary or other vascular disorders; normal nuclear stress test in August of 2000 the left. On zetia and and niacin therapy: excellent lipid profile.  Atrial fibrillation, permanent On  appropriate warfarin anticoagulation  Complete heart block Pacemaker dependent. Single-chamber Medtronic Adapta ADD SR 1 implanted April 2009, with normal function by recent [employer]. There are no episodes of rapid ventricular rate. There is no escape rhythm in the device is turned down to 30 beats per minute. The parameters are good and expected longevity is about another 4 years. Device heart rate histograms show adequate distribution suggesting that chronotropic incompetence is not a cause of his symptoms of fatigue.  Pacemaker Pacemaker dependent. Single-chamber Medtronic Adapta ADD SR 1 implanted April 2009  OBSTRUCTIVE SLEEP APNEA Reports compliance with CPAP  HTN (hypertension) The vasodilator effects of amlodipine may indeed be responsible for his persistent nasal congestion. Will switch back to the 5/40 mg dose of Azor.  Orders Placed This Encounter  Procedures  . EKG 12-Lead   Meds ordered this encounter  Medications  . DISCONTD: amLODipine-olmesartan (AZOR) 10-40 MG per tablet    Sig: Take 1 tablet by mouth daily.  Marland Kitchen amLODipine-olmesartan (AZOR) 5-40 MG per tablet    Sig: Take 1 tablet by mouth daily.    Dispense:  90 tablet    Refill:  3  . furosemide (LASIX) 20 MG tablet    Sig:   . triamcinolone cream (KENALOG) 0.1 %    Sig:      Robbi Scurlock  Thurmon Fair, MD, [facility] [phone] office [phone] pager

## 2012-06-12 NOTE — Assessment & Plan Note (Signed)
Pacemaker dependent. Single-chamber Medtronic Adapta ADD SR 1 implanted April 2009, with normal function by recent [employer]. There are no episodes of rapid ventricular rate. There is no escape rhythm in the device is turned down to 30 beats per minute. The parameters are good and expected longevity is about another 4 years. Device heart rate histograms show adequate distribution suggesting that chronotropic incompetence is not a cause of his symptoms of fatigue.

## 2012-06-12 NOTE — Patient Instructions (Signed)
Home device check on 07-13-2012. Your physician recommends that you schedule a follow-up appointment in: in December 2014

## 2012-06-12 NOTE — Assessment & Plan Note (Signed)
The vasodilator effects of amlodipine may indeed be responsible for his persistent nasal congestion. Will switch back to the 5/40 mg dose of Azor.

## 2012-06-12 NOTE — Assessment & Plan Note (Signed)
Reports compliance with CPAP.

## 2012-07-01 DIAGNOSIS — I4891 Unspecified atrial fibrillation: Secondary | ICD-10-CM | POA: Diagnosis not present

## 2012-07-01 DIAGNOSIS — Z7901 Long term (current) use of anticoagulants: Secondary | ICD-10-CM | POA: Diagnosis not present

## 2012-08-14 DIAGNOSIS — Z7901 Long term (current) use of anticoagulants: Secondary | ICD-10-CM | POA: Diagnosis not present

## 2012-08-14 DIAGNOSIS — I4891 Unspecified atrial fibrillation: Secondary | ICD-10-CM | POA: Diagnosis not present

## 2012-09-11 DIAGNOSIS — Z7901 Long term (current) use of anticoagulants: Secondary | ICD-10-CM | POA: Diagnosis not present

## 2012-09-11 DIAGNOSIS — Z23 Encounter for immunization: Secondary | ICD-10-CM | POA: Diagnosis not present

## 2012-09-11 DIAGNOSIS — I4891 Unspecified atrial fibrillation: Secondary | ICD-10-CM | POA: Diagnosis not present

## 2012-09-17 ENCOUNTER — Encounter: Payer: Self-pay | Admitting: *Deleted

## 2012-10-16 DIAGNOSIS — I4891 Unspecified atrial fibrillation: Secondary | ICD-10-CM | POA: Diagnosis not present

## 2012-10-16 DIAGNOSIS — Z7901 Long term (current) use of anticoagulants: Secondary | ICD-10-CM | POA: Diagnosis not present

## 2012-10-25 LAB — PACEMAKER DEVICE OBSERVATION

## 2012-10-27 ENCOUNTER — Ambulatory Visit (INDEPENDENT_AMBULATORY_CARE_PROVIDER_SITE_OTHER): Payer: Medicare Other

## 2012-10-27 DIAGNOSIS — I442 Atrioventricular block, complete: Secondary | ICD-10-CM | POA: Diagnosis not present

## 2012-10-27 DIAGNOSIS — I4891 Unspecified atrial fibrillation: Secondary | ICD-10-CM | POA: Diagnosis not present

## 2012-11-05 DIAGNOSIS — M25549 Pain in joints of unspecified hand: Secondary | ICD-10-CM | POA: Diagnosis not present

## 2012-11-07 ENCOUNTER — Encounter: Payer: Self-pay | Admitting: *Deleted

## 2012-11-07 LAB — REMOTE PACEMAKER DEVICE
BMOD-0005RV: 95 {beats}/min
BRDY-0004RV: 130 {beats}/min
RV LEAD THRESHOLD: 0.625 V

## 2012-11-10 ENCOUNTER — Encounter: Payer: Self-pay | Admitting: Cardiovascular Disease

## 2012-11-26 DIAGNOSIS — I4891 Unspecified atrial fibrillation: Secondary | ICD-10-CM | POA: Diagnosis not present

## 2012-11-26 DIAGNOSIS — Z7901 Long term (current) use of anticoagulants: Secondary | ICD-10-CM | POA: Diagnosis not present

## 2012-12-08 DIAGNOSIS — M25549 Pain in joints of unspecified hand: Secondary | ICD-10-CM | POA: Diagnosis not present

## 2012-12-24 ENCOUNTER — Telehealth: Payer: Self-pay | Admitting: *Deleted

## 2012-12-24 DIAGNOSIS — I872 Venous insufficiency (chronic) (peripheral): Secondary | ICD-10-CM | POA: Diagnosis not present

## 2012-12-24 DIAGNOSIS — M79609 Pain in unspecified limb: Secondary | ICD-10-CM | POA: Diagnosis not present

## 2012-12-24 NOTE — Telephone Encounter (Signed)
Patient is requesting a surgical clearance be sent to Dr. Mack Hook, [facility] for surgery on his left hand (index finger) 01/13/2013 OP surgical center- local anesthesia w/block.  Needs to go off Coumadin x 5 days.  Phone # [phone]  Fax# [phone]

## 2012-12-25 ENCOUNTER — Encounter: Payer: Self-pay | Admitting: Cardiovascular Disease

## 2012-12-25 NOTE — Telephone Encounter (Signed)
Surgical clearance faxed to [facility].

## 2012-12-30 DIAGNOSIS — Z7901 Long term (current) use of anticoagulants: Secondary | ICD-10-CM | POA: Diagnosis not present

## 2012-12-30 DIAGNOSIS — I4891 Unspecified atrial fibrillation: Secondary | ICD-10-CM | POA: Diagnosis not present

## 2013-01-06 ENCOUNTER — Other Ambulatory Visit: Payer: Self-pay | Admitting: Orthopedic Surgery

## 2013-01-06 ENCOUNTER — Encounter (HOSPITAL_BASED_OUTPATIENT_CLINIC_OR_DEPARTMENT_OTHER): Payer: Self-pay | Admitting: *Deleted

## 2013-01-06 NOTE — Progress Notes (Signed)
To come in 01/12/13 for pt ptt bmet-had ekg 6/14 with cardiology Has been here several times and has cardiac clearance and pacer for faxed

## 2013-01-09 DIAGNOSIS — H26019 Infantile and juvenile cortical, lamellar, or zonular cataract, unspecified eye: Secondary | ICD-10-CM | POA: Diagnosis not present

## 2013-01-09 DIAGNOSIS — H2510 Age-related nuclear cataract, unspecified eye: Secondary | ICD-10-CM | POA: Diagnosis not present

## 2013-01-12 ENCOUNTER — Encounter (HOSPITAL_BASED_OUTPATIENT_CLINIC_OR_DEPARTMENT_OTHER)
Admission: RE | Admit: 2013-01-12 | Discharge: 2013-01-12 | Disposition: A | Discharge status: Home / Self Care | Payer: Medicare Other | Source: Ambulatory Visit | Attending: Orthopedic Surgery | Admitting: Orthopedic Surgery

## 2013-01-12 ENCOUNTER — Other Ambulatory Visit: Payer: Self-pay | Admitting: Dermatology

## 2013-01-12 DIAGNOSIS — D485 Neoplasm of uncertain behavior of skin: Secondary | ICD-10-CM | POA: Diagnosis not present

## 2013-01-12 DIAGNOSIS — L570 Actinic keratosis: Secondary | ICD-10-CM | POA: Diagnosis not present

## 2013-01-12 DIAGNOSIS — I10 Essential (primary) hypertension: Secondary | ICD-10-CM | POA: Diagnosis not present

## 2013-01-12 DIAGNOSIS — D239 Other benign neoplasm of skin, unspecified: Secondary | ICD-10-CM | POA: Diagnosis not present

## 2013-01-12 DIAGNOSIS — Z85828 Personal history of other malignant neoplasm of skin: Secondary | ICD-10-CM | POA: Diagnosis not present

## 2013-01-12 DIAGNOSIS — L821 Other seborrheic keratosis: Secondary | ICD-10-CM | POA: Diagnosis not present

## 2013-01-12 DIAGNOSIS — M2489 Other specific joint derangement of other specified joint, not elsewhere classified: Secondary | ICD-10-CM | POA: Diagnosis not present

## 2013-01-12 DIAGNOSIS — I4891 Unspecified atrial fibrillation: Secondary | ICD-10-CM | POA: Diagnosis not present

## 2013-01-12 DIAGNOSIS — Z950 Presence of cardiac pacemaker: Secondary | ICD-10-CM | POA: Diagnosis not present

## 2013-01-12 DIAGNOSIS — E785 Hyperlipidemia, unspecified: Secondary | ICD-10-CM | POA: Diagnosis not present

## 2013-01-12 DIAGNOSIS — G4730 Sleep apnea, unspecified: Secondary | ICD-10-CM | POA: Diagnosis not present

## 2013-01-12 DIAGNOSIS — L719 Rosacea, unspecified: Secondary | ICD-10-CM | POA: Diagnosis not present

## 2013-01-12 LAB — BASIC METABOLIC PANEL
BUN: 22 mg/dL (ref 6–23)
CO2: 27 mEq/L (ref 19–32)
CREATININE: 1.21 mg/dL (ref 0.50–1.35)
Calcium: 9 mg/dL (ref 8.4–10.5)
Chloride: 107 mEq/L (ref 96–112)
GFR calc non Af Amer: 55 mL/min — ABNORMAL LOW (ref >90)
GFR, EST AFRICAN AMERICAN: 64 mL/min — AB (ref >90)
Glucose, Bld: 103 mg/dL — ABNORMAL HIGH (ref 70–99)
POTASSIUM: 4.6 meq/L (ref 3.7–5.3)
Sodium: 144 mEq/L (ref 137–147)

## 2013-01-12 LAB — PROTIME-INR
INR: 1.22 (ref 0.00–1.49)
PROTHROMBIN TIME: 15.1 s (ref 11.6–15.2)

## 2013-01-12 LAB — APTT: APTT: 29 s (ref 24–37)

## 2013-01-12 NOTE — H&P (Signed)
Dean Morgan is an 80 y.o. male.   CC / Reason for Visit: Left index finger dysfunction HPI: Today, this patient returns for more detailed discussion regarding surgical recommendations and planning. History of present illness 12-08-12: This patient returns for reevaluation.  He thinks that the MCP injection given on 11-05-12 made no difference either way with the finger.  He notes that he continues to have some problems with the left index finger.  He was able to successfully play golf through the summer.  With careful questioning, he thinks that the problem is not at all related to pain but to the radial collateral instability of the index finger.  It is really bothered his dexterity quite a bit.  He notices the most issues sometimes trying to pinch with it such that he bypasses to the long finger, but he also reports that at times he has dropped objects like a glass of water.  He denies numbness or tingling.  Presenting history follows: This patient is a 80 year old male who underwent left index finger MCP dorsal radial spur excision on 01-29-12 with Dr. Rhona Raider.  He has been participating in hand therapy.  He has noticed that he struggles to be a little pinch the thumb against the index finger, partially due to pain at the index finger MCP joint.  In addition when he pulled his fingers into a fist the index finger rotates in a way that overlaps the long finger.  The surgical site remains enlarged.  Past Medical History  Diagnosis Date  . Hypertension   . Dysrhythmia     AF  . Pacemaker   . Hyperlipemia   . Arthritis   . Wears glasses   . Wears hearing aid   . Sleep apnea     does use his cpap  . Pacemaker     Past Surgical History  Procedure Laterality Date  . Tonsillectomy    . Thumb arthroscopy  2007    rt thunb mass  . Shoulder arthroscopy  2004    left  . Knee arthroscopy  2/10    left  . Pacemaker insertion  2009  . Cardioversion      multiple times-pacer put in 2009  .  Colonoscopy    . Mass excision  01/29/2012    Procedure: EXCISION MASS;  Surgeon: Hessie Dibble, MD;  Location: [place];  Service: Orthopedics;  Laterality: Left;  excision of bone spur left hand    History reviewed. No pertinent family history. Social History:  reports that he has never smoked. He does not have any smokeless tobacco history on file. He reports that he drinks alcohol. He reports that he does not use illicit drugs.  Allergies:  Allergies  Allergen Reactions  . Lipitor [Atorvastatin]     Muscle pain  . Penicillins     REACTION: rash  . Vytorin [Ezetimibe-Simvastatin]     Muscle pain    No prescriptions prior to admission    No results found for this or any previous visit (from the past 48 hour(s)). No results found.  Review of Systems  All other systems reviewed and are negative.    Height 5' 9.5" (1.765 m), weight 88.451 kg (195 lb). Physical Exam   Constitutional:  WD, WN, NAD HEENT:  NCAT, EOMI Neuro/Psych:  Alert & oriented to person, place, and time; appropriate mood & affect Lymphatic: No generalized UE edema or lymphadenopathy Extremities / MSK:  Both UE are normal with respect to appearance,  ranges of motion, joint stability, muscle strength/tone, sensation, & perfusion except as otherwise noted:  The left index finger remains slightly enlarged but without significant swelling.  The soft tissues had become much more soft and supple.  He has some grating or crepitus with stress and grind testing.  He has some pain with forced flexion beyond the comfort zone.  The MP joint flexes to 55, long finger to 65 versus the right side which is 72, 80.  The left index finger is 15 ulnar deviated with pinch.  With firm pinch, he reports that he feels a sensation of achiness or pressure that radiates along the index finger from the proximal phalanx into the metacarpal region.  Index finger tip pinch: Right 15/left 8 and I cannot clearly demonstrate  the existence of a palmaris longus on either arm today.  Labs / Xrays:  None today.  Previously: 3 views of the left index finger ordered and obtained today reveal significant joint space narrowing at the MCP joint.  Assessment:  Left index finger dysfunction following MCP spur excision--source of dysfunction appears to be the degree to which the index finger rotates in overlaps the long finger with flexion, preventing good index finger-thumb apposition and diminished pinch strength  Plan: We had a long discussion about this problem.  He appears significantly bothered by the change in his dexterity.  He remains quite active.  I discussed with him the possibility of soft tissue procedure to tighten and/or reconstruct the radial collateral ligament, possibly slightly releasing the ulnar collateral ligament as it seems to have also become a little tight.  Such a soft tissue balancing procedure presents the possibility of somewhat uncertain outcome.  Another option that we have previously discussed would be a rotational osteotomy through the metacarpal.  This would prevent the index finger/long finger cross over with grasp, but might be best reserved for failure of the soft tissue reconstruction.  Today, we decided to further evaluate the feasibility of soft tissue reconstruction, and making the intraoperative decision to either proceed with it versus a rotational osteotomy of the metacarpal.  We will plan to proceed after first of the year.  He will plan to stop his Coumadin 5 days preoperatively.  Liel Rudden A. 01/12/2013, 12:55 PM

## 2013-01-13 ENCOUNTER — Ambulatory Visit (HOSPITAL_COMMUNITY): Payer: Medicare Other

## 2013-01-13 ENCOUNTER — Encounter (HOSPITAL_BASED_OUTPATIENT_CLINIC_OR_DEPARTMENT_OTHER)
Admission: RE | Disposition: A | Discharge status: Home / Self Care | Payer: Self-pay | Source: Ambulatory Visit | Attending: Orthopedic Surgery

## 2013-01-13 ENCOUNTER — Encounter (HOSPITAL_BASED_OUTPATIENT_CLINIC_OR_DEPARTMENT_OTHER): Payer: Medicare Other | Admitting: Anesthesiology

## 2013-01-13 ENCOUNTER — Encounter (HOSPITAL_BASED_OUTPATIENT_CLINIC_OR_DEPARTMENT_OTHER): Payer: Self-pay | Admitting: Anesthesiology

## 2013-01-13 ENCOUNTER — Ambulatory Visit (HOSPITAL_BASED_OUTPATIENT_CLINIC_OR_DEPARTMENT_OTHER)
Admission: RE | Admit: 2013-01-13 | Discharge: 2013-01-13 | Disposition: A | Discharge status: Home / Self Care | Payer: Medicare Other | Source: Ambulatory Visit | Attending: Orthopedic Surgery | Admitting: Orthopedic Surgery

## 2013-01-13 ENCOUNTER — Ambulatory Visit (HOSPITAL_BASED_OUTPATIENT_CLINIC_OR_DEPARTMENT_OTHER): Payer: Medicare Other | Admitting: Anesthesiology

## 2013-01-13 DIAGNOSIS — I10 Essential (primary) hypertension: Secondary | ICD-10-CM | POA: Insufficient documentation

## 2013-01-13 DIAGNOSIS — M2489 Other specific joint derangement of other specified joint, not elsewhere classified: Secondary | ICD-10-CM | POA: Diagnosis not present

## 2013-01-13 DIAGNOSIS — M20099 Other deformity of finger(s), unspecified finger(s): Secondary | ICD-10-CM | POA: Diagnosis not present

## 2013-01-13 DIAGNOSIS — M2480 Other specific joint derangements of unspecified joint, not elsewhere classified: Principal | ICD-10-CM

## 2013-01-13 DIAGNOSIS — G4730 Sleep apnea, unspecified: Secondary | ICD-10-CM | POA: Insufficient documentation

## 2013-01-13 DIAGNOSIS — E785 Hyperlipidemia, unspecified: Secondary | ICD-10-CM | POA: Insufficient documentation

## 2013-01-13 DIAGNOSIS — S63639A Sprain of interphalangeal joint of unspecified finger, initial encounter: Secondary | ICD-10-CM | POA: Diagnosis not present

## 2013-01-13 DIAGNOSIS — I4891 Unspecified atrial fibrillation: Secondary | ICD-10-CM | POA: Insufficient documentation

## 2013-01-13 DIAGNOSIS — M24849 Other specific joint derangements of unspecified hand, not elsewhere classified: Secondary | ICD-10-CM | POA: Diagnosis not present

## 2013-01-13 DIAGNOSIS — Z950 Presence of cardiac pacemaker: Secondary | ICD-10-CM | POA: Diagnosis not present

## 2013-01-13 DIAGNOSIS — M79609 Pain in unspecified limb: Secondary | ICD-10-CM | POA: Diagnosis not present

## 2013-01-13 HISTORY — PX: METACARPAL OSTEOTOMY: SHX5036

## 2013-01-13 LAB — POCT HEMOGLOBIN-HEMACUE: Hemoglobin: 14.8 g/dL (ref 13.0–17.0)

## 2013-01-13 SURGERY — OSTEOTOMY, METACARPAL BONE
Anesthesia: General | Site: Finger | Laterality: Left

## 2013-01-13 MED ORDER — EPHEDRINE SULFATE 50 MG/ML IJ SOLN
INTRAMUSCULAR | Status: DC | PRN
  Administered 2013-01-13: 10 mg via INTRAVENOUS

## 2013-01-13 MED ORDER — FENTANYL CITRATE 0.05 MG/ML IJ SOLN
50.0000 ug | INTRAMUSCULAR | Status: DC | PRN
  Administered 2013-01-13: 100 ug via INTRAVENOUS

## 2013-01-13 MED ORDER — FENTANYL CITRATE 0.05 MG/ML IJ SOLN
INTRAMUSCULAR | Status: AC
  Filled 2013-01-13: qty 4

## 2013-01-13 MED ORDER — DEXAMETHASONE SODIUM PHOSPHATE 10 MG/ML IJ SOLN
INTRAMUSCULAR | Status: DC | PRN
  Administered 2013-01-13: 5 mg via INTRAVENOUS

## 2013-01-13 MED ORDER — FENTANYL CITRATE 0.05 MG/ML IJ SOLN
INTRAMUSCULAR | Status: AC
  Filled 2013-01-13: qty 2

## 2013-01-13 MED ORDER — HYDROMORPHONE HCL PF 1 MG/ML IJ SOLN: 0.5000 mg | INTRAMUSCULAR | Status: DC | PRN

## 2013-01-13 MED ORDER — VANCOMYCIN HCL IN DEXTROSE 1-5 GM/200ML-% IV SOLN
INTRAVENOUS | Status: AC
  Filled 2013-01-13: qty 200

## 2013-01-13 MED ORDER — MIDAZOLAM HCL 2 MG/2ML IJ SOLN
INTRAMUSCULAR | Status: AC
  Filled 2013-01-13: qty 2

## 2013-01-13 MED ORDER — BUPIVACAINE-EPINEPHRINE PF 0.5-1:200000 % IJ SOLN
INTRAMUSCULAR | Status: AC
  Filled 2013-01-13: qty 30

## 2013-01-13 MED ORDER — HYDROCODONE-ACETAMINOPHEN 5-325 MG PO TABS: 1.0000 | ORAL_TABLET | ORAL | Status: DC | PRN

## 2013-01-13 MED ORDER — LIDOCAINE HCL (CARDIAC) 20 MG/ML IV SOLN
INTRAVENOUS | Status: DC | PRN
  Administered 2013-01-13: 60 mg via INTRAVENOUS

## 2013-01-13 MED ORDER — OXYCODONE-ACETAMINOPHEN 5-325 MG PO TABS: 1.0000 | ORAL_TABLET | ORAL | Status: DC | PRN

## 2013-01-13 MED ORDER — LACTATED RINGERS IV SOLN: INTRAVENOUS | Status: DC

## 2013-01-13 MED ORDER — VANCOMYCIN HCL IN DEXTROSE 1-5 GM/200ML-% IV SOLN
1000.0000 mg | INTRAVENOUS | Status: AC
  Administered 2013-01-13: 1000 mg via INTRAVENOUS

## 2013-01-13 MED ORDER — LIDOCAINE HCL 2 % IJ SOLN
INTRAMUSCULAR | Status: AC
  Filled 2013-01-13: qty 20

## 2013-01-13 MED ORDER — ROPIVACAINE HCL 5 MG/ML IJ SOLN
INTRAMUSCULAR | Status: DC | PRN
  Administered 2013-01-13: 30 mL via PERINEURAL

## 2013-01-13 MED ORDER — MIDAZOLAM HCL 2 MG/2ML IJ SOLN
1.0000 mg | INTRAMUSCULAR | Status: DC | PRN
  Administered 2013-01-13: 1.5 mg via INTRAVENOUS

## 2013-01-13 MED ORDER — BUPIVACAINE-EPINEPHRINE PF 0.25-1:200000 % IJ SOLN
INTRAMUSCULAR | Status: AC
  Filled 2013-01-13: qty 30

## 2013-01-13 MED ORDER — ONDANSETRON HCL 4 MG/2ML IJ SOLN
INTRAMUSCULAR | Status: DC | PRN
  Administered 2013-01-13: 4 mg via INTRAVENOUS

## 2013-01-13 MED ORDER — PROPOFOL 10 MG/ML IV BOLUS
INTRAVENOUS | Status: DC | PRN
  Administered 2013-01-13: 150 mg via INTRAVENOUS

## 2013-01-13 MED ORDER — LACTATED RINGERS IV SOLN
INTRAVENOUS | Status: DC
  Administered 2013-01-13 (×2): via INTRAVENOUS

## 2013-01-13 SURGICAL SUPPLY — 53 items
ANCH SUT 2-0 MN NDL DRL PLSTR (Anchor) ×2 IMPLANT
ANCHOR JUGGERKNOT 1.0 1DR 2-0 (Anchor) ×4 IMPLANT
BLADE MINI RND TIP GREEN BEAV (BLADE) IMPLANT
BLADE SURG 15 STRL LF DISP TIS (BLADE) ×1 IMPLANT
BLADE SURG 15 STRL SS (BLADE) ×3
BNDG CMPR 9X4 STRL LF SNTH (GAUZE/BANDAGES/DRESSINGS) ×1
BNDG COHESIVE 4X5 TAN STRL (GAUZE/BANDAGES/DRESSINGS) ×3 IMPLANT
BNDG ESMARK 4X9 LF (GAUZE/BANDAGES/DRESSINGS) ×3 IMPLANT
BNDG GAUZE ELAST 4 BULKY (GAUZE/BANDAGES/DRESSINGS) ×6 IMPLANT
BRUSH SCRUB EZ PLAIN DRY (MISCELLANEOUS) IMPLANT
CAP PIN ORTHO PINK (CAP) ×2 IMPLANT
CHLORAPREP W/TINT 26ML (MISCELLANEOUS) ×3 IMPLANT
CORDS BIPOLAR (ELECTRODE) ×3 IMPLANT
COVER MAYO STAND STRL (DRAPES) ×3 IMPLANT
COVER TABLE BACK 60X90 (DRAPES) ×3 IMPLANT
CUFF TOURNIQUET SINGLE 18IN (TOURNIQUET CUFF) ×2 IMPLANT
CUFF TOURNIQUET SINGLE 24IN (TOURNIQUET CUFF) IMPLANT
DRAPE C-ARM 42X72 X-RAY (DRAPES) ×3 IMPLANT
DRAPE EXTREMITY T 121X128X90 (DRAPE) ×3 IMPLANT
DRAPE SURG 17X23 STRL (DRAPES) ×3 IMPLANT
DRSG ADAPTIC 3X8 NADH LF (GAUZE/BANDAGES/DRESSINGS) IMPLANT
DRSG EMULSION OIL 3X3 NADH (GAUZE/BANDAGES/DRESSINGS) ×2 IMPLANT
GLOVE BIO SURGEON STRL SZ7.5 (GLOVE) ×3 IMPLANT
GLOVE BIOGEL PI IND STRL 7.0 (GLOVE) IMPLANT
GLOVE BIOGEL PI IND STRL 8 (GLOVE) ×1 IMPLANT
GLOVE BIOGEL PI INDICATOR 7.0 (GLOVE) ×2
GLOVE BIOGEL PI INDICATOR 8 (GLOVE) ×2
GLOVE ECLIPSE 6.5 STRL STRAW (GLOVE) ×2 IMPLANT
GOWN STRL REUS W/ TWL LRG LVL3 (GOWN DISPOSABLE) ×1 IMPLANT
GOWN STRL REUS W/TWL LRG LVL3 (GOWN DISPOSABLE) ×3
K-WIRE .062X4 (WIRE) ×2 IMPLANT
LOOP VESSEL MAXI BLUE (MISCELLANEOUS) IMPLANT
NDL HYPO 25X1 1.5 SAFETY (NEEDLE) IMPLANT
NEEDLE HYPO 25X1 1.5 SAFETY (NEEDLE) IMPLANT
NS IRRIG 1000ML POUR BTL (IV SOLUTION) ×3 IMPLANT
PACK BASIN DAY SURGERY FS (CUSTOM PROCEDURE TRAY) ×3 IMPLANT
PAD CAST 4YDX4 CTTN HI CHSV (CAST SUPPLIES) ×1 IMPLANT
PADDING CAST ABS 4INX4YD NS (CAST SUPPLIES)
PADDING CAST ABS COTTON 4X4 ST (CAST SUPPLIES) IMPLANT
PADDING CAST COTTON 4X4 STRL (CAST SUPPLIES) ×3
SLEEVE SCD COMPRESS KNEE MED (MISCELLANEOUS) ×3 IMPLANT
SPONGE GAUZE 4X4 12PLY (GAUZE/BANDAGES/DRESSINGS) ×3 IMPLANT
STOCKINETTE 4X48 STRL (DRAPES) ×3 IMPLANT
SUT ETHIBOND 3-0 V-5 (SUTURE) IMPLANT
SUT MERSILENE 4 0 P 3 (SUTURE) IMPLANT
SUT PDS AB 2-0 CT2 27 (SUTURE) IMPLANT
SUT STEEL 4 (SUTURE) IMPLANT
SUT VIC AB 2-0 CT3 27 (SUTURE) IMPLANT
SUT VICRYL 4-0 PS2 18IN ABS (SUTURE) IMPLANT
SUT VICRYL RAPIDE 4/0 PS 2 (SUTURE) ×3 IMPLANT
SYRINGE 10CC LL (SYRINGE) ×2 IMPLANT
TOWEL OR 17X24 6PK STRL BLUE (TOWEL DISPOSABLE) ×3 IMPLANT
UNDERPAD 30X30 INCONTINENT (UNDERPADS AND DIAPERS) ×3 IMPLANT

## 2013-01-13 NOTE — Interval H&P Note (Signed)
History and Physical Interval Note:  01/13/2013 7:27 AM  Dean Morgan  has presented today for surgery, with the diagnosis of left index MCP Instability  The various methods of treatment have been discussed with the patient and family. After consideration of risks, benefits and other options for treatment, the patient has consented to  Procedure(s): LEFT INDEX FINGER MCP RADIAL COLLATERAL REPAIR/RECONSTRUCTION VS METACARPAL ROTATIONAL OSTEOTOMY (Left) as a surgical intervention .  The patient's history has been reviewed, patient examined, no change in status, stable for surgery.  I have reviewed the patient's chart and labs.  Questions were answered to the patient's satisfaction.     Susannah Carbin A.

## 2013-01-13 NOTE — Anesthesia Postprocedure Evaluation (Signed)
  Anesthesia Post-op Note  Patient: Dean Morgan  Procedure(s) Performed: Procedure(s): LEFT INDEX FINGER MCP RADIAL COLLATERAL REPAIR/RECONSTRUCTION VS METACARPAL ROTATIONAL OSTEOTOMY (Left)  Patient Location: PACU  Anesthesia Type:GA combined with regional for post-op pain  Level of Consciousness: awake and alert   Airway and Oxygen Therapy: Patient Spontanous Breathing  Post-op Pain: none  Post-op Assessment: Post-op Vital signs reviewed  Post-op Vital Signs: stable  Complications: No apparent anesthesia complications

## 2013-01-13 NOTE — Anesthesia Preprocedure Evaluation (Addendum)
Anesthesia Evaluation  Patient identified by MRN, date of birth, ID band Patient awake    Reviewed: Allergy & Precautions, H&P , NPO status , Patient's Chart, lab work & pertinent test results  History of Anesthesia Complications Negative for: history of anesthetic complications  Airway       Dental   Pulmonary sleep apnea ,          Cardiovascular hypertension, Pt. on medications and Pt. on home beta blockers + dysrhythmias Atrial Fibrillation + pacemaker     Neuro/Psych negative neurological ROS  negative psych ROS   GI/Hepatic   Endo/Other    Renal/GU      Musculoskeletal   Abdominal   Peds  Hematology   Anesthesia Other Findings   Reproductive/Obstetrics                          Anesthesia Physical Anesthesia Plan Anesthesia Quick Evaluation

## 2013-01-13 NOTE — Op Note (Signed)
01/13/2013  7:27 AM  PATIENT:  Dean Morgan  80 y.o. male  PRE-OPERATIVE DIAGNOSIS:  Left index finger MCP instability  POST-OPERATIVE DIAGNOSIS:  Same  PROCEDURE:  Left index finger MCP capsulotomy and radial collateral ligament repair  SURGEON: Rayvon Char. Grandville Silos, MD  PHYSICIAN ASSISTANT: None  ANESTHESIA:  regional and general  SPECIMENS:  None  DRAINS:   None  PREOPERATIVE INDICATIONS:  Dean Morgan is a  80 y.o. male who had previously undergone excision of a spur on the radial aspect of the distal index metacarpal. Postoperatively he developed progressive radial collateral insufficiency/instability leading to deviation of the index finger towards the long and making firm pinch against the index finger difficult. We discussed a surgical plan that included exploration, likely release and repair of ligament or possibly even reconstruction if necessary, to include even rotational osteotomy of the metacarpal if necessary.  The risks benefits and alternatives were discussed with the patient preoperatively including but not limited to the risks of infection, bleeding, nerve injury, cardiopulmonary complications, the need for revision surgery, among others, and the patient verbalized understanding and consented to proceed.  OPERATIVE IMPLANTS: Mini Juggerknot suture anchors x2  OPERATIVE PROCEDURE:  After receiving prophylactic antibiotics and a regional block, the patient was escorted to the operative theatre and placed in a supine position. General anesthesia was administered. A surgical "time-out" was performed during which the planned procedure, proposed operative site, and the correct patient identity were compared to the operative consent and agreement confirmed by the circulating nurse according to current facility policy.  Following application of a tourniquet to the operative extremity, the exposed skin was prepped with Chloraprep and draped in the usual sterile fashion.  The  limb was exsanguinated with an Esmarch bandage and the tourniquet inflated to approximately 14mmHg higher than systolic BP.  A slightly curved incision was made over the dorsal radial aspect of the index metacarpal. The skin flaps were lifted full-thickness and retracted. The extensor apparatus was split just radial to the extensor tendon and a plane was developed between it and the underlying capsule. This was reflected. The capsule was split longitudinally on the dorsum and then the radial collateral remnants were stripped off of the neck of the metacarpal. There was arthritic change noted in the joint, with an eburnated portion of head on the volar ulnar aspect. It was similar eburnation on the volar aspect of the proximal phalanx. Ultimately in order to achieve a good position and alignment, a large portion of the ulnar collateral ligament was stripped off the metacarpal, and a portion of the volar plate excised. Now the joint had good mobility he could be placed in the desired addition, flexed about 40 and slightly radially deviated. It was pinned in this position with a 0.062 inch K wire and then radial collateral ligament repair performed with the 2 suture anchors, grasping suture configuration through the ligament. The wound was copiously irrigated.  The remainder the capsule was closed on the dorsum with 20 Max braid suture remnants from the anchors. Extensor apparatus was also repaired in this fashion. Tourniquet was released, additional hemostasis obtained and the wound was again irrigated before closing the skin with 4-0 Vicryl Rapide running horizontal mattress suture. The K wire was then driven out the dorsum of the hand so that it was no longer protruding from the finger, and was bent over and clipped with pain Applied. A soft dressing was applied and the patient was taken to the recovery room.  DISPOSITION: The patient will be discharged home today with typical instructions returning in 10-15  days, no x-rays required.

## 2013-01-13 NOTE — Progress Notes (Signed)
Assisted Dr. Massagee with left, ultrasound guided, supraclavicular block. Side rails up, monitors on throughout procedure. See vital signs in flow sheet. Tolerated Procedure well. 

## 2013-01-13 NOTE — Discharge Instructions (Addendum)
Discharge Instructions    You have a dressing on your hand. Move your fingers as much as possible, making a full fist and fully opening the fist. Elevate your hand to reduce pain & swelling of the digits.  Ice over the operative site may be helpful to reduce pain & swelling.  DO NOT USE HEAT. Pain medicine has been prescribed for you.  Use your medicine as needed over the first 48 hours, and then you can begin to taper your use.  You may use Tylenol in place of your prescribed pain medication, but not IN ADDITION to it. Leave the dressing in place until you return to our office.  You may shower, but keep the bandage clean & dry.  You may drive a car when you are off of prescription pain medications and can safely control your vehicle with both hands. You may have already made your follow-up appointment when we completed your preop visit.  If not, please call our office today or the next business day to make your return appointment for 10-15 days after surgery.     Please call [phone] during normal business hours or [phone] after hours for any problems. Including the following:  - excessive redness of the incisions - drainage for more than 4 days - fever of more than 101.5 F  *Please note that pain medications will not be refilled after hours or on weekends.           HAND SURGERY    HOME CARE INSTRUCTIONS    The following instructions have been prepared to help you care for yourself upon your return home today.  Wound Care:  Keep your hand elevated above the level of your heart. Do not allow it to dangle by your side. Keep the dressing dry and do not remove it unless your doctor advises you to do so. He will usually change it at the time of you post-op visit. Moving your fingers is advised to stimulate circulation but will depend on the site of your surgery. Of course, if you have a splint applied your doctor will advise you about movement.  Activity:  Do not drive or  operate machinery today. Rest today and then you may return to your normal activity and work as indicated by your physician.  Diet: Drink liquids today or eat a light diet. You may resume a regular diet tomorrow.  General expectations: Pain for two or three days. Fingers may become slightly swollen.   Unexpected Observations- Call your doctor if any of these occur: Severe pain not relieved by pain medication. Elevated temperature. Dressing soaked with blood. Inability to move fingers. White or bluish color to fingers.  Return to office: (as instructed by Dr. Grandville Silos)   Post Anesthesia Home Care Instructions  Activity: Get plenty of rest for the remainder of the day. A responsible adult should stay with you for 24 hours following the procedure.  For the next 24 hours, DO NOT: -Drive a car -Paediatric nurse -Drink alcoholic beverages -Take any medication unless instructed by your physician -Make any legal decisions or sign important papers.  Meals: Start with liquid foods such as gelatin or soup. Progress to regular foods as tolerated. Avoid greasy, spicy, heavy foods. If nausea and/or vomiting occur, drink only clear liquids until the nausea and/or vomiting subsides. Call your physician if vomiting continues.  Special Instructions/Symptoms: Your throat may feel dry or sore from the anesthesia or the breathing tube placed in your throat during surgery. If this  causes discomfort, gargle with warm salt water. The discomfort should disappear within 24 hours.

## 2013-01-13 NOTE — Anesthesia Procedure Notes (Addendum)
Procedure Name: LMA Insertion Date/Time: 01/13/2013 7:40 AM Performed by: Maryella Shivers Pre-anesthesia Checklist: Patient identified, Emergency Drugs available, Suction available and Patient being monitored Patient Re-evaluated:Patient Re-evaluated prior to inductionOxygen Delivery Method: Circle System Utilized Preoxygenation: Pre-oxygenation with 100% oxygen Intubation Type: IV induction Ventilation: Mask ventilation without difficulty LMA: LMA inserted LMA Size: 5.0 Number of attempts: 1 Airway Equipment and Method: bite block Placement Confirmation: positive ETCO2 Tube secured with: Tape Dental Injury: Teeth and Oropharynx as per pre-operative assessment    Anesthesia Regional Block:  Supraclavicular block  Pre-Anesthetic Checklist: ,, timeout performed, Correct Patient, Correct Site, Correct Laterality, Correct Procedure, Correct Position, site marked, Risks and benefits discussed,  Surgical consent,  Pre-op evaluation,  At surgeon's request and post-op pain management  Laterality: Left and Upper  Prep: chloraprep       Needles:   Needle Type: Echogenic Needle     Needle Length:cm 9 cm Needle Gauge: 21 and 21 G  Needle insertion depth: 4 cm   Additional Needles:  Procedures: ultrasound guided (picture in chart) Supraclavicular block Narrative:  Start time: 01/13/2013 7:05 AM End time: 01/13/2013 7:20 AM Injection made incrementally with aspirations every 5 mL.  Performed by: Personally  Anesthesiologist: T Massaagee  Additional Notes: Tolerated well

## 2013-01-13 NOTE — Transfer of Care (Signed)
Immediate Anesthesia Transfer of Care Note  Patient: Dean Morgan  Procedure(s) Performed: Procedure(s): LEFT INDEX FINGER MCP RADIAL COLLATERAL REPAIR/RECONSTRUCTION VS METACARPAL ROTATIONAL OSTEOTOMY (Left)  Patient Location: PACU  Anesthesia Type:GA combined with regional for post-op pain  Level of Consciousness: sedated  Airway & Oxygen Therapy: Patient Spontanous Breathing and Patient connected to face mask oxygen  Post-op Assessment: Report given to PACU RN and Post -op Vital signs reviewed and stable  Post vital signs: Reviewed and stable  Complications: No apparent anesthesia complications

## 2013-01-14 ENCOUNTER — Encounter (HOSPITAL_BASED_OUTPATIENT_CLINIC_OR_DEPARTMENT_OTHER): Payer: Self-pay | Admitting: Orthopedic Surgery

## 2013-01-16 NOTE — Addendum Note (Signed)
Addendum created 01/16/13 1828 by Rica Koyanagi, MD   Modules edited: Anesthesia Responsible Staff

## 2013-01-20 ENCOUNTER — Ambulatory Visit: Payer: Medicare Other | Admitting: Cardiovascular Disease

## 2013-01-20 ENCOUNTER — Ambulatory Visit (INDEPENDENT_AMBULATORY_CARE_PROVIDER_SITE_OTHER): Payer: Medicare Other | Admitting: *Deleted

## 2013-01-20 ENCOUNTER — Encounter: Payer: Self-pay | Admitting: Cardiology

## 2013-01-20 ENCOUNTER — Ambulatory Visit (INDEPENDENT_AMBULATORY_CARE_PROVIDER_SITE_OTHER): Payer: Medicare Other | Admitting: Cardiology

## 2013-01-20 VITALS — BP 130/84 | HR 65 | Ht 69.0 in | Wt 203.0 lb

## 2013-01-20 DIAGNOSIS — Z950 Presence of cardiac pacemaker: Secondary | ICD-10-CM

## 2013-01-20 DIAGNOSIS — G4733 Obstructive sleep apnea (adult) (pediatric): Secondary | ICD-10-CM

## 2013-01-20 DIAGNOSIS — E785 Hyperlipidemia, unspecified: Secondary | ICD-10-CM | POA: Diagnosis not present

## 2013-01-20 DIAGNOSIS — I442 Atrioventricular block, complete: Secondary | ICD-10-CM | POA: Diagnosis not present

## 2013-01-20 DIAGNOSIS — I10 Essential (primary) hypertension: Secondary | ICD-10-CM | POA: Diagnosis not present

## 2013-01-20 DIAGNOSIS — I4891 Unspecified atrial fibrillation: Secondary | ICD-10-CM | POA: Diagnosis not present

## 2013-01-20 DIAGNOSIS — I4821 Permanent atrial fibrillation: Secondary | ICD-10-CM

## 2013-01-20 LAB — MDC_IDC_ENUM_SESS_TYPE_INCLINIC
Battery Impedance: 2060 Ohm
Battery Remaining Longevity: 3
Lead Channel Pacing Threshold Amplitude: 0.75 V
Lead Channel Pacing Threshold Pulse Width: 0.4 ms
Lead Channel Setting Pacing Amplitude: 2 V
Lead Channel Setting Sensing Sensitivity: 5.6 mV
MDC IDC MSMT BATTERY VOLTAGE: 2.74 V
MDC IDC MSMT LEADCHNL RV IMPEDANCE VALUE: 457 Ohm
MDC IDC MSMT LEADCHNL RV SENSING INTR AMPL: 15.68 mV
MDC IDC SET LEADCHNL RV PACING PULSEWIDTH: 0.4 ms

## 2013-01-20 LAB — PACEMAKER DEVICE OBSERVATION

## 2013-01-20 NOTE — Patient Instructions (Signed)
Continue taking medications as directed.  Have pacemaker checked in 3 months at home. Follow-up with Dr. Sallyanne Kuster in 6 months

## 2013-01-20 NOTE — Progress Notes (Signed)
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   MRN: [id number]  01/21/2013 Dean Morgan, [name]   13-Dec-1931  [id number]  Primary Physicia Janalyn Rouse, MD Primary Cardiologist: Dr. 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 Sallyanne Kuster. He has chronic atrial fibrillation with a slow ventricular response. In 2009, he had a pacemaker placed because of the bradycardia. He takes Coumadin regularly. It is followed through Dr. Raul Del office. He had a NST in 2013 that was low risk for ischemia. He has normal systolic function.   He presents to clinic today for routein 6 month evaluation. He denies any issues since he was last seen. He denies chest pain and SOB. No palpitations, dizziness, syncope or near syncope. No orthopnea, PND or LEE. He reports daily compliance with his medications. He continues to get his INR checked monthly at his PCP. He denies any abnormal bleeding.     Current Outpatient Prescriptions  Medication Sig Dispense Refill  . amLODipine-olmesartan (AZOR) 10-40 MG per tablet Take 1 tablet by mouth daily.      . Arginine 1000 MG TABS Take by mouth daily.      Marland Kitchen aspirin 81 MG tablet Take 81 mg by mouth daily.      . celecoxib (CELEBREX) 200 MG capsule       . Cholecalciferol (VITAMIN D3) 5000 UNITS TABS Take by mouth daily.      . Cyanocobalamin (VITAMIN B-12 PO) Take 2,500 mcg by mouth daily.      Marland Kitchen ezetimibe (ZETIA) 10 MG tablet Take 10 mg by mouth daily.      . fish oil-omega-3 fatty acids 1000 MG capsule Take 2 g by mouth daily.      . furosemide (LASIX) 20 MG tablet 20 mg as needed.       Marland Kitchen glucosamine-chondroitin 500-400 MG tablet Take 1 tablet by mouth 3 (three) times daily.      . nebivolol (BYSTOLIC) 10 MG tablet Take 10 mg by mouth daily.      . niacin (SLO-NIACIN) 500 MG tablet Take 500 mg by mouth 2 (two) times daily with a meal.      . potassium chloride SA (K-DUR,KLOR-CON) 20 MEQ tablet Take 20 mEq by mouth  daily. Takes mon,wed,fri      . testosterone (ANDROGEL) 50 MG/5GM GEL Place 5 g onto the skin daily.      Marland Kitchen warfarin (COUMADIN) 2 MG tablet Take 2 mg by mouth daily. tues,thurs      . warfarin (COUMADIN) 4 MG tablet Take 4 mg by mouth daily. All other days except tues thurs       No current facility-administered medications for this visit.    Allergies  Allergen Reactions  . Lipitor [Atorvastatin]     Muscle pain  . Penicillins     REACTION: rash  . Vytorin [Ezetimibe-Simvastatin]     Muscle pain    History   Social History  . Marital Status: Married    Spouse Name: N/A    Number of Children: N/A  . Years of Education: N/A   Occupational History  . Not on file.   Social History Main Topics  . Smoking status: Never Smoker   . Smokeless tobacco: Not on file  . Alcohol Use: Yes     Comment: occ  . Drug Use: No  . Sexual Activity: Not on file   Other Topics Concern  . Not on file  Social History Narrative  . No narrative on file     Review of Systems: General: negative for chills, fever, night sweats or weight changes.  Cardiovascular: negative for chest pain, dyspnea on exertion, edema, orthopnea, palpitations, paroxysmal nocturnal dyspnea or shortness of breath Dermatological: negative for rash Respiratory: negative for cough or wheezing Urologic: negative for hematuria Abdominal: negative for nausea, vomiting, diarrhea, bright red blood per rectum, melena, or hematemesis Neurologic: negative for visual changes, syncope, or dizziness All other systems reviewed and are otherwise negative except as noted above.    Blood pressure 130/84, pulse 65, height 5\' 9"  (1.753 m), weight 203 lb (92.08 kg).  General appearance: alert, cooperative and no distress Neck: no carotid bruit and no JVD Lungs: clear to auscultation bilaterally Heart: regular rate and rhythm and no murmurs, rubs or gallops Extremities: no LEE Pulses: 2+ and symmetric Skin: warm and  dry Neurologic: Grossly normal  EKG Paced rhythm. HR 65 bpm  ASSESSMENT AND PLAN:   Atrial fibrillation, permanent Rate controlled. Continue Bystolic for rate control. On warfarin for anticoagulation. INR followed by PCP  HTN (hypertension) BP is well controlled today. Continue current plan of care of Azor and Bystolic  HYPERLIPIDEMIA Last lipid panel was in June. Lipids well controlled. HDL at 81. Continue Zetia and omega-3  OBSTRUCTIVE SLEEP APNEA Compliant with CPAP  Pacemaker Device interrogation revealed normal device functioning. He had a brief 7 beat run of NSVT in June 2014, but no other events. He has a battery life of 3 years remaining. Repeat interrogation via phone in 3 months     PLAN: 6 month routine follow-up visit. No complaints. Exam benign. Vitals stable. Device interrogation reveals normal functioning. Afib is rate controlled. On the appropriate meds. He is in no need of any refills at this time. He requested samples of Bystolic and Azor and these were provided. He will repeat pacer check in 3 months from home and will follow up with Dr. Sallyanne Kuster in 6 months.   Arvilla Market 01/21/2013 6:44 PM

## 2013-01-21 DIAGNOSIS — H2510 Age-related nuclear cataract, unspecified eye: Secondary | ICD-10-CM | POA: Diagnosis not present

## 2013-01-21 NOTE — Assessment & Plan Note (Signed)
Last lipid panel was in June. Lipids well controlled. HDL at 81. Continue Zetia and omega-3

## 2013-01-21 NOTE — Assessment & Plan Note (Signed)
Device interrogation revealed normal device functioning. He had a brief 7 beat run of NSVT in June 2014, but no other events. He has a battery life of 3 years remaining. Repeat interrogation via phone in 3 months

## 2013-01-21 NOTE — Assessment & Plan Note (Signed)
BP is well controlled today. Continue current plan of care of Azor and Bystolic

## 2013-01-21 NOTE — Assessment & Plan Note (Addendum)
Rate controlled. Continue Bystolic for rate control. On warfarin for anticoagulation. INR followed by PCP

## 2013-01-21 NOTE — Assessment & Plan Note (Signed)
Compliant with CPAP 

## 2013-01-22 NOTE — Progress Notes (Signed)
Pacemaker check in clinic with Dean Morgan, [place]. Normal device function. Threshold, sensing, and impedance consistent with previous measurements. Device programmed to maximize longevity. Permanent AF + Warfarin. 1 possible NSVT episode in June 2014 x 7 bts with Max V rate 208bpm. Device programmed at appropriate safety margins. Histogram distribution appropriate for patient activity level. Device programmed to optimize intrinsic conduction. Estimated longevity 3 years. Patient will follow up remotely in 3 months and with [facility] in 6 months.

## 2013-01-28 DIAGNOSIS — H26019 Infantile and juvenile cortical, lamellar, or zonular cataract, unspecified eye: Secondary | ICD-10-CM | POA: Diagnosis not present

## 2013-01-28 DIAGNOSIS — H2510 Age-related nuclear cataract, unspecified eye: Secondary | ICD-10-CM | POA: Diagnosis not present

## 2013-02-04 DIAGNOSIS — H2510 Age-related nuclear cataract, unspecified eye: Secondary | ICD-10-CM | POA: Diagnosis not present

## 2013-02-04 DIAGNOSIS — H26019 Infantile and juvenile cortical, lamellar, or zonular cataract, unspecified eye: Secondary | ICD-10-CM | POA: Diagnosis not present

## 2013-02-06 DIAGNOSIS — H35369 Drusen (degenerative) of macula, unspecified eye: Secondary | ICD-10-CM | POA: Diagnosis not present

## 2013-02-06 DIAGNOSIS — Z125 Encounter for screening for malignant neoplasm of prostate: Secondary | ICD-10-CM | POA: Diagnosis not present

## 2013-02-06 DIAGNOSIS — H33329 Round hole, unspecified eye: Secondary | ICD-10-CM | POA: Diagnosis not present

## 2013-02-06 DIAGNOSIS — H43819 Vitreous degeneration, unspecified eye: Secondary | ICD-10-CM | POA: Diagnosis not present

## 2013-02-06 DIAGNOSIS — H33319 Horseshoe tear of retina without detachment, unspecified eye: Secondary | ICD-10-CM | POA: Diagnosis not present

## 2013-02-06 DIAGNOSIS — I10 Essential (primary) hypertension: Secondary | ICD-10-CM | POA: Diagnosis not present

## 2013-02-09 DIAGNOSIS — H33319 Horseshoe tear of retina without detachment, unspecified eye: Secondary | ICD-10-CM | POA: Diagnosis not present

## 2013-02-12 DIAGNOSIS — I4891 Unspecified atrial fibrillation: Secondary | ICD-10-CM | POA: Diagnosis not present

## 2013-02-12 DIAGNOSIS — Z7901 Long term (current) use of anticoagulants: Secondary | ICD-10-CM | POA: Diagnosis not present

## 2013-02-13 DIAGNOSIS — E669 Obesity, unspecified: Secondary | ICD-10-CM | POA: Diagnosis not present

## 2013-02-13 DIAGNOSIS — Z7901 Long term (current) use of anticoagulants: Secondary | ICD-10-CM | POA: Diagnosis not present

## 2013-02-13 DIAGNOSIS — Z0000 Encounter for general adult medical examination without abnormal findings: Secondary | ICD-10-CM | POA: Diagnosis not present

## 2013-02-13 DIAGNOSIS — I10 Essential (primary) hypertension: Secondary | ICD-10-CM | POA: Diagnosis not present

## 2013-02-13 DIAGNOSIS — Z125 Encounter for screening for malignant neoplasm of prostate: Secondary | ICD-10-CM | POA: Diagnosis not present

## 2013-02-13 DIAGNOSIS — Z1331 Encounter for screening for depression: Secondary | ICD-10-CM | POA: Diagnosis not present

## 2013-02-13 DIAGNOSIS — Z23 Encounter for immunization: Secondary | ICD-10-CM | POA: Diagnosis not present

## 2013-02-13 DIAGNOSIS — E291 Testicular hypofunction: Secondary | ICD-10-CM | POA: Diagnosis not present

## 2013-02-13 DIAGNOSIS — D126 Benign neoplasm of colon, unspecified: Secondary | ICD-10-CM | POA: Diagnosis not present

## 2013-02-13 DIAGNOSIS — E785 Hyperlipidemia, unspecified: Secondary | ICD-10-CM | POA: Diagnosis not present

## 2013-02-13 DIAGNOSIS — I4891 Unspecified atrial fibrillation: Secondary | ICD-10-CM | POA: Diagnosis not present

## 2013-02-16 DIAGNOSIS — M20099 Other deformity of finger(s), unspecified finger(s): Secondary | ICD-10-CM | POA: Diagnosis not present

## 2013-02-16 DIAGNOSIS — S63639A Sprain of interphalangeal joint of unspecified finger, initial encounter: Secondary | ICD-10-CM | POA: Diagnosis not present

## 2013-02-19 DIAGNOSIS — Z1212 Encounter for screening for malignant neoplasm of rectum: Secondary | ICD-10-CM | POA: Diagnosis not present

## 2013-02-27 DIAGNOSIS — H33329 Round hole, unspecified eye: Secondary | ICD-10-CM | POA: Diagnosis not present

## 2013-02-27 DIAGNOSIS — H33319 Horseshoe tear of retina without detachment, unspecified eye: Secondary | ICD-10-CM | POA: Diagnosis not present

## 2013-03-06 DIAGNOSIS — S63639A Sprain of interphalangeal joint of unspecified finger, initial encounter: Secondary | ICD-10-CM | POA: Diagnosis not present

## 2013-03-06 DIAGNOSIS — M20099 Other deformity of finger(s), unspecified finger(s): Secondary | ICD-10-CM | POA: Diagnosis not present

## 2013-03-09 ENCOUNTER — Other Ambulatory Visit: Payer: Self-pay | Admitting: Cardiovascular Disease

## 2013-03-09 NOTE — Telephone Encounter (Signed)
E-SENT RX

## 2013-03-11 DIAGNOSIS — S63639A Sprain of interphalangeal joint of unspecified finger, initial encounter: Secondary | ICD-10-CM | POA: Diagnosis not present

## 2013-03-11 DIAGNOSIS — M20099 Other deformity of finger(s), unspecified finger(s): Secondary | ICD-10-CM | POA: Diagnosis not present

## 2013-03-17 DIAGNOSIS — S63639A Sprain of interphalangeal joint of unspecified finger, initial encounter: Secondary | ICD-10-CM | POA: Diagnosis not present

## 2013-03-17 DIAGNOSIS — M20099 Other deformity of finger(s), unspecified finger(s): Secondary | ICD-10-CM | POA: Diagnosis not present

## 2013-03-18 DIAGNOSIS — L02519 Cutaneous abscess of unspecified hand: Secondary | ICD-10-CM | POA: Diagnosis not present

## 2013-03-20 DIAGNOSIS — I4891 Unspecified atrial fibrillation: Secondary | ICD-10-CM | POA: Diagnosis not present

## 2013-03-20 DIAGNOSIS — Z7901 Long term (current) use of anticoagulants: Secondary | ICD-10-CM | POA: Diagnosis not present

## 2013-03-24 DIAGNOSIS — S63639A Sprain of interphalangeal joint of unspecified finger, initial encounter: Secondary | ICD-10-CM | POA: Diagnosis not present

## 2013-03-25 DIAGNOSIS — Z7901 Long term (current) use of anticoagulants: Secondary | ICD-10-CM | POA: Diagnosis not present

## 2013-03-25 DIAGNOSIS — I4891 Unspecified atrial fibrillation: Secondary | ICD-10-CM | POA: Diagnosis not present

## 2013-03-31 DIAGNOSIS — S63659A Sprain of metacarpophalangeal joint of unspecified finger, initial encounter: Secondary | ICD-10-CM | POA: Diagnosis not present

## 2013-04-01 DIAGNOSIS — Z7901 Long term (current) use of anticoagulants: Secondary | ICD-10-CM | POA: Diagnosis not present

## 2013-04-01 DIAGNOSIS — I4891 Unspecified atrial fibrillation: Secondary | ICD-10-CM | POA: Diagnosis not present

## 2013-04-02 DIAGNOSIS — S63659A Sprain of metacarpophalangeal joint of unspecified finger, initial encounter: Secondary | ICD-10-CM | POA: Diagnosis not present

## 2013-04-06 DIAGNOSIS — S63659A Sprain of metacarpophalangeal joint of unspecified finger, initial encounter: Secondary | ICD-10-CM | POA: Diagnosis not present

## 2013-04-08 DIAGNOSIS — S63659A Sprain of metacarpophalangeal joint of unspecified finger, initial encounter: Secondary | ICD-10-CM | POA: Diagnosis not present

## 2013-04-14 DIAGNOSIS — S63659A Sprain of metacarpophalangeal joint of unspecified finger, initial encounter: Secondary | ICD-10-CM | POA: Diagnosis not present

## 2013-04-16 ENCOUNTER — Other Ambulatory Visit: Payer: Self-pay | Admitting: Orthopedic Surgery

## 2013-04-16 ENCOUNTER — Encounter (HOSPITAL_BASED_OUTPATIENT_CLINIC_OR_DEPARTMENT_OTHER): Payer: Self-pay | Admitting: *Deleted

## 2013-04-16 DIAGNOSIS — S61209A Unspecified open wound of unspecified finger without damage to nail, initial encounter: Secondary | ICD-10-CM | POA: Diagnosis not present

## 2013-04-16 NOTE — H&P (Signed)
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   Chief Complaint: left hand drainage HPI: Dr. Quay Burow returns indicating he continues to have intermittent skin opening and drainage from the area that we have treated previously.  Past Medical History  Diagnosis Date  . Hypertension   . Dysrhythmia     AF  . Pacemaker   . Hyperlipemia   . Arthritis   . Wears glasses   . Wears hearing aid   . Sleep apnea     does use his cpap  . Pacemaker     Past Surgical History  Procedure Laterality Date  . Tonsillectomy    . Thumb arthroscopy  2007    rt thunb mass  . Shoulder arthroscopy  2004    left  . Knee arthroscopy  2/10    left  . Pacemaker insertion  2009  . Cardioversion      multiple times-pacer put in 2009  . Colonoscopy    . Mass excision  01/29/2012    Procedure: EXCISION MASS;  Surgeon: Hessie Dibble, MD;  Location: [facility];  Service: Orthopedics;  Laterality: Left;  excision of bone spur left hand  . Metacarpal osteotomy Left 01/13/2013    Procedure: LEFT INDEX FINGER MCP RADIAL COLLATERAL REPAIR/RECONSTRUCTION VS METACARPAL ROTATIONAL OSTEOTOMY;  Surgeon: Jolyn Nap, MD;  Location: [facility];  Service: Orthopedics;  Laterality: Left;    History reviewed. No pertinent family history. Social History:  reports that he has never smoked. He does not have any smokeless tobacco history on file. He reports that he drinks alcohol. He reports that he does not use illicit drugs.  Allergies:  Allergies  Allergen Reactions  . Lipitor [Atorvastatin]     Muscle pain  . Penicillins     REACTION: rash  . Vytorin [Ezetimibe-Simvastatin]     Muscle pain    No prescriptions prior to admission    No results found for this or any previous visit (from the past 48 hour(s)). No results found.  ROS  Height 5\' 9"  (1.753 m), weight 92.08 kg (203 lb). Physical Exam  Constitutional:  WD, WN, NAD HEENT:  NCAT, EOMI Neuro/Psych:  Alert & oriented to  person, place, and time; appropriate mood & affect Lymphatic: No generalized UE edema or lymphadenopathy Extremities / MSK:  Both UE are normal with respect to appearance, ranges of motion, joint stability, muscle strength/tone, sensation, & perfusion except as otherwise noted:  Overall the redness has resolved, there is a 2 x 5 mm scab , with a scant drop of expressible fluid.  There appears to be no edema of significance.  MP joint range 15-50.  Tip pinch: Right 15/left 2  Assessment/Plan Persistent/recurrent left hand drainage postop  Exploration and debridement with primary closure.  Jolyn Nap 04/16/2013, 4:04 PM

## 2013-04-16 NOTE — Progress Notes (Signed)
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

## 2013-04-20 ENCOUNTER — Ambulatory Visit (HOSPITAL_BASED_OUTPATIENT_CLINIC_OR_DEPARTMENT_OTHER): Payer: Medicare Other | Admitting: Anesthesiology

## 2013-04-20 ENCOUNTER — Encounter (HOSPITAL_BASED_OUTPATIENT_CLINIC_OR_DEPARTMENT_OTHER): Payer: Self-pay | Admitting: *Deleted

## 2013-04-20 ENCOUNTER — Ambulatory Visit (HOSPITAL_BASED_OUTPATIENT_CLINIC_OR_DEPARTMENT_OTHER)
Admission: RE | Admit: 2013-04-20 | Discharge: 2013-04-20 | Disposition: A | Discharge status: Home / Self Care | Payer: Medicare Other | Source: Ambulatory Visit | Attending: Orthopedic Surgery | Admitting: Orthopedic Surgery

## 2013-04-20 ENCOUNTER — Encounter (HOSPITAL_BASED_OUTPATIENT_CLINIC_OR_DEPARTMENT_OTHER)
Admission: RE | Disposition: A | Discharge status: Home / Self Care | Payer: Self-pay | Source: Ambulatory Visit | Attending: Orthopedic Surgery

## 2013-04-20 ENCOUNTER — Encounter (HOSPITAL_BASED_OUTPATIENT_CLINIC_OR_DEPARTMENT_OTHER): Payer: Medicare Other | Admitting: Anesthesiology

## 2013-04-20 DIAGNOSIS — Z888 Allergy status to other drugs, medicaments and biological substances status: Secondary | ICD-10-CM | POA: Diagnosis not present

## 2013-04-20 DIAGNOSIS — T8130XA Disruption of wound, unspecified, initial encounter: Secondary | ICD-10-CM | POA: Diagnosis not present

## 2013-04-20 DIAGNOSIS — Z880 Allergy status to penicillin: Secondary | ICD-10-CM | POA: Insufficient documentation

## 2013-04-20 DIAGNOSIS — I4891 Unspecified atrial fibrillation: Secondary | ICD-10-CM | POA: Diagnosis not present

## 2013-04-20 DIAGNOSIS — I10 Essential (primary) hypertension: Secondary | ICD-10-CM | POA: Insufficient documentation

## 2013-04-20 DIAGNOSIS — M129 Arthropathy, unspecified: Secondary | ICD-10-CM | POA: Insufficient documentation

## 2013-04-20 DIAGNOSIS — Z9889 Other specified postprocedural states: Secondary | ICD-10-CM | POA: Diagnosis not present

## 2013-04-20 DIAGNOSIS — T819XXA Unspecified complication of procedure, initial encounter: Secondary | ICD-10-CM | POA: Diagnosis not present

## 2013-04-20 DIAGNOSIS — G4733 Obstructive sleep apnea (adult) (pediatric): Secondary | ICD-10-CM | POA: Diagnosis not present

## 2013-04-20 DIAGNOSIS — Z950 Presence of cardiac pacemaker: Secondary | ICD-10-CM | POA: Insufficient documentation

## 2013-04-20 DIAGNOSIS — T8189XA Other complications of procedures, not elsewhere classified, initial encounter: Secondary | ICD-10-CM | POA: Insufficient documentation

## 2013-04-20 DIAGNOSIS — Y849 Medical procedure, unspecified as the cause of abnormal reaction of the patient, or of later complication, without mention of misadventure at the time of the procedure: Secondary | ICD-10-CM | POA: Insufficient documentation

## 2013-04-20 DIAGNOSIS — E785 Hyperlipidemia, unspecified: Secondary | ICD-10-CM | POA: Insufficient documentation

## 2013-04-20 HISTORY — PX: I&D EXTREMITY: SHX5045

## 2013-04-20 LAB — POCT I-STAT, CHEM 8
BUN: 24 mg/dL — AB (ref 6–23)
CHLORIDE: 108 meq/L (ref 96–112)
Calcium, Ion: 1.11 mmol/L — ABNORMAL LOW (ref 1.13–1.30)
Creatinine, Ser: 1.3 mg/dL (ref 0.50–1.35)
Glucose, Bld: 87 mg/dL (ref 70–99)
HCT: 47 % (ref 39.0–52.0)
Hemoglobin: 16 g/dL (ref 13.0–17.0)
POTASSIUM: 3.6 meq/L — AB (ref 3.7–5.3)
SODIUM: 144 meq/L (ref 137–147)
TCO2: 23 mmol/L (ref 0–100)

## 2013-04-20 LAB — PROTIME-INR
INR: 1.39 (ref 0.00–1.49)
Prothrombin Time: 16.7 seconds — ABNORMAL HIGH (ref 11.6–15.2)

## 2013-04-20 LAB — APTT: aPTT: 29 seconds (ref 24–37)

## 2013-04-20 SURGERY — IRRIGATION AND DEBRIDEMENT EXTREMITY
Anesthesia: General | Site: Hand | Laterality: Left

## 2013-04-20 MED ORDER — FENTANYL CITRATE 0.05 MG/ML IJ SOLN
INTRAMUSCULAR | Status: AC
  Filled 2013-04-20: qty 6

## 2013-04-20 MED ORDER — CHLORHEXIDINE GLUCONATE 4 % EX LIQD: 60.0000 mL | Freq: Once | CUTANEOUS | Status: DC

## 2013-04-20 MED ORDER — BUPIVACAINE-EPINEPHRINE PF 0.5-1:200000 % IJ SOLN
INTRAMUSCULAR | Status: AC
  Filled 2013-04-20: qty 30

## 2013-04-20 MED ORDER — FENTANYL CITRATE 0.05 MG/ML IJ SOLN
INTRAMUSCULAR | Status: DC | PRN
  Administered 2013-04-20: 25 ug via INTRAVENOUS
  Administered 2013-04-20: 50 ug via INTRAVENOUS

## 2013-04-20 MED ORDER — LIDOCAINE HCL (CARDIAC) 20 MG/ML IV SOLN
INTRAVENOUS | Status: DC | PRN
  Administered 2013-04-20: 30 mg via INTRAVENOUS

## 2013-04-20 MED ORDER — PROPOFOL 10 MG/ML IV BOLUS
INTRAVENOUS | Status: DC | PRN
  Administered 2013-04-20: 100 mg via INTRAVENOUS

## 2013-04-20 MED ORDER — LACTATED RINGERS IV SOLN: INTRAVENOUS | Status: DC

## 2013-04-20 MED ORDER — LACTATED RINGERS IV SOLN
INTRAVENOUS | Status: DC
  Administered 2013-04-20: 09:00:00 via INTRAVENOUS

## 2013-04-20 MED ORDER — BUPIVACAINE-EPINEPHRINE (PF) 0.5% -1:200000 IJ SOLN
INTRAMUSCULAR | Status: DC | PRN
  Administered 2013-04-20: 4 mL

## 2013-04-20 MED ORDER — HYDROMORPHONE HCL PF 1 MG/ML IJ SOLN: 0.2500 mg | INTRAMUSCULAR | Status: DC | PRN

## 2013-04-20 MED ORDER — DEXAMETHASONE SODIUM PHOSPHATE 10 MG/ML IJ SOLN
INTRAMUSCULAR | Status: DC | PRN
  Administered 2013-04-20: 5 mg via INTRAVENOUS

## 2013-04-20 MED ORDER — MIDAZOLAM HCL 2 MG/2ML IJ SOLN: 1.0000 mg | INTRAMUSCULAR | Status: DC | PRN

## 2013-04-20 MED ORDER — ONDANSETRON HCL 4 MG/2ML IJ SOLN
INTRAMUSCULAR | Status: DC | PRN
  Administered 2013-04-20: 4 mg via INTRAVENOUS

## 2013-04-20 MED ORDER — SULFAMETHOXAZOLE-TMP DS 800-160 MG PO TABS: 1.0000 | ORAL_TABLET | Freq: Two times a day (BID) | ORAL | Status: DC

## 2013-04-20 MED ORDER — VANCOMYCIN HCL IN DEXTROSE 1-5 GM/200ML-% IV SOLN
1000.0000 mg | INTRAVENOUS | Status: AC
  Administered 2013-04-20: 1000 mg via INTRAVENOUS

## 2013-04-20 MED ORDER — VANCOMYCIN HCL IN DEXTROSE 1-5 GM/200ML-% IV SOLN
INTRAVENOUS | Status: AC
  Filled 2013-04-20: qty 200

## 2013-04-20 MED ORDER — FENTANYL CITRATE 0.05 MG/ML IJ SOLN: 50.0000 ug | INTRAMUSCULAR | Status: DC | PRN

## 2013-04-20 SURGICAL SUPPLY — 41 items
BANDAGE COBAN STERILE 2 (GAUZE/BANDAGES/DRESSINGS) IMPLANT
BLADE MINI RND TIP GREEN BEAV (BLADE) IMPLANT
BLADE SURG 15 STRL LF DISP TIS (BLADE) ×1 IMPLANT
BLADE SURG 15 STRL SS (BLADE) ×3
BNDG COHESIVE 4X5 TAN STRL (GAUZE/BANDAGES/DRESSINGS) ×3 IMPLANT
BNDG GAUZE ELAST 4 BULKY (GAUZE/BANDAGES/DRESSINGS) ×6 IMPLANT
CHLORAPREP W/TINT 26ML (MISCELLANEOUS) ×3 IMPLANT
CORDS BIPOLAR (ELECTRODE) ×2 IMPLANT
COVER MAYO STAND STRL (DRAPES) ×3 IMPLANT
COVER TABLE BACK 60X90 (DRAPES) ×3 IMPLANT
CUFF TOURNIQUET SINGLE 18IN (TOURNIQUET CUFF) ×2 IMPLANT
DRAPE EXTREMITY T 121X128X90 (DRAPE) ×3 IMPLANT
DRAPE SURG 17X23 STRL (DRAPES) ×3 IMPLANT
DRSG EMULSION OIL 3X3 NADH (GAUZE/BANDAGES/DRESSINGS) ×3 IMPLANT
GLOVE BIO SURGEON STRL SZ7.5 (GLOVE) ×3 IMPLANT
GLOVE BIOGEL PI IND STRL 7.0 (GLOVE) IMPLANT
GLOVE BIOGEL PI IND STRL 8 (GLOVE) ×1 IMPLANT
GLOVE BIOGEL PI INDICATOR 7.0 (GLOVE) ×2
GLOVE BIOGEL PI INDICATOR 8 (GLOVE) ×2
GLOVE ECLIPSE 6.5 STRL STRAW (GLOVE) ×2 IMPLANT
GOWN STRL REUS W/ TWL LRG LVL3 (GOWN DISPOSABLE) ×1 IMPLANT
GOWN STRL REUS W/TWL LRG LVL3 (GOWN DISPOSABLE) ×6
NDL HYPO 25X1 1.5 SAFETY (NEEDLE) IMPLANT
NEEDLE HYPO 25X1 1.5 SAFETY (NEEDLE) ×3 IMPLANT
NS IRRIG 1000ML POUR BTL (IV SOLUTION) ×3 IMPLANT
PACK BASIN DAY SURGERY FS (CUSTOM PROCEDURE TRAY) ×3 IMPLANT
PAD CAST 4YDX4 CTTN HI CHSV (CAST SUPPLIES) ×1 IMPLANT
PADDING CAST ABS 4INX4YD NS (CAST SUPPLIES)
PADDING CAST ABS COTTON 4X4 ST (CAST SUPPLIES) IMPLANT
PADDING CAST COTTON 4X4 STRL (CAST SUPPLIES) ×3
RUBBERBAND STERILE (MISCELLANEOUS) IMPLANT
SPONGE GAUZE 4X4 12PLY (GAUZE/BANDAGES/DRESSINGS) ×3 IMPLANT
STOCKINETTE 4X48 STRL (DRAPES) ×3 IMPLANT
SUT ETHILON 4 0 PS 2 18 (SUTURE) ×2 IMPLANT
SUT VICRYL RAPIDE 4-0 (SUTURE) IMPLANT
SUT VICRYL RAPIDE 4/0 PS 2 (SUTURE) IMPLANT
SYR BULB 3OZ (MISCELLANEOUS) ×3 IMPLANT
SYRINGE 10CC LL (SYRINGE) ×2 IMPLANT
TOWEL OR 17X24 6PK STRL BLUE (TOWEL DISPOSABLE) ×3 IMPLANT
TOWEL OR NON WOVEN STRL DISP B (DISPOSABLE) ×1 IMPLANT
UNDERPAD 30X30 INCONTINENT (UNDERPADS AND DIAPERS) ×3 IMPLANT

## 2013-04-20 NOTE — Discharge Instructions (Addendum)
Discharge Instructions   You have a light dressing on your hand.  You may begin gentle motion of your fingers and hand immediately, but you should not do any heavy lifting or gripping.  Elevate your hand to reduce pain & swelling of the digits.  Ice over the operative site may be helpful to reduce pain & swelling.  DO NOT USE HEAT. Pain medicine has been prescribed for you.  Use your medicine as needed over the first 48 hours, and then you can begin to taper your use. You may use Tylenol in place of your prescribed pain medication, but not IN ADDITION to it. Leave the dressing in place until the third day after your surgery and then remove it, leaving it open to air.  After the bandage has been removed you may shower, but do not soak the incision.  You may drive a car when you are off of prescription pain medications and can safely control your vehicle with both hands. We will address whether therapy will be required or not when you return to the office. You may have already made your follow-up appointment when we completed your preop visit.  If not, please call our office today or the next business day to make your return appointment for 5-8 days after surgery. You may re-start your Coumadin tonight.   Please call [phone] during normal business hours or [phone] after hours for any problems. Including the following:  - excessive redness of the incisions - drainage for more than 4 days - fever of more than 101.5 F  *Please note that pain medications will not be refilled after hours or on weekends.  Post Anesthesia Home Care Instructions  Activity: Get plenty of rest for the remainder of the day. A responsible adult should stay with you for 24 hours following the procedure.  For the next 24 hours, DO NOT: -Drive a car -Paediatric nurse -Drink alcoholic beverages -Take any medication unless instructed by your physician -Make any legal decisions or sign important  papers.  Meals: Start with liquid foods such as gelatin or soup. Progress to regular foods as tolerated. Avoid greasy, spicy, heavy foods. If nausea and/or vomiting occur, drink only clear liquids until the nausea and/or vomiting subsides. Call your physician if vomiting continues.  Special Instructions/Symptoms: Your throat may feel dry or sore from the anesthesia or the breathing tube placed in your throat during surgery. If this causes discomfort, gargle with warm salt water. The discomfort should disappear within 24 hours.

## 2013-04-20 NOTE — Op Note (Signed)
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  The risks benefits and alternatives were discussed with the patient preoperatively including but not limited to the risks of infection, bleeding, nerve injury, cardiopulmonary complications, the need for revision surgery, among others, and the patient verbalized understanding and consented to proceed.  OPERATIVE IMPLANTS: None  OPERATIVE PROCEDURE:  After receiving prophylactic antibiotics, the patient was escorted to the operative theatre and placed in a supine position. General anesthesia was administered. A surgical "time-out" was performed during which the planned procedure, proposed operative site, and the correct patient identity were compared to the operative consent and agreement confirmed by the circulating nurse according to current facility policy.  Following application of a tourniquet to the operative extremity, the exposed skin was prepped with Chloraprep and draped in the usual sterile fashion.  The limb was exsanguinated with an Esmarch bandage and the tourniquet inflated to approximately 161mmHg higher than systolic BP. After several marked and with epinephrine was instilled around the wound circumferentially, somewhat remote from the actual operative field help with postoperative pain control and hemostasis  The small circular open area was elliptically excised in full-thickness fashion and extended slightly proximally and distally along the course of the  previous incision. The flaps were elevated off of the deep tissue full thickness. Previously placed permanent suture was encountered, and 2 kn were excised as well as some other strand of suture. Satisfied that all the potential a superficial suture was debrided, the soft tissues around the suture knots were also debrided with scraping debridement and then the wound is copiously irrigated. Tourniquet was released and additional hemostasis obtained with bipolar electrocautery and the wound was closed with 4-0 nylon interrupted sutures. A bulky dressing was applied and he was taken to recovery in stable condition, breathing spontaneously.  DISPOSITION: He'll be discharged home today with typical instructions returning for reevaluation.

## 2013-04-20 NOTE — Anesthesia Procedure Notes (Signed)
Procedure Name: LMA Insertion Date/Time: 04/20/2013 10:05 AM Performed by: Alisen Marsiglia Pre-anesthesia Checklist: Patient identified, Emergency Drugs available, Suction available and Patient being monitored Patient Re-evaluated:Patient Re-evaluated prior to inductionOxygen Delivery Method: Circle System Utilized Preoxygenation: Pre-oxygenation with 100% oxygen Intubation Type: IV induction Ventilation: Mask ventilation without difficulty LMA: LMA inserted LMA Size: 4.0 Number of attempts: 1 Airway Equipment and Method: bite block Placement Confirmation: positive ETCO2 Tube secured with: Tape Dental Injury: Teeth and Oropharynx as per pre-operative assessment

## 2013-04-20 NOTE — Anesthesia Postprocedure Evaluation (Signed)
  Anesthesia Post-op Note  Patient: Dean Morgan, [name]  Procedure(s) Performed: Procedure(s): LEFT HAND WOUND DEBRIDEMENT AND CLOSURE  (Left)  Patient Location: PACU  Anesthesia Type:General  Level of Consciousness: awake and alert   Airway and Oxygen Therapy: Patient Spontanous Breathing  Post-op Pain: none  Post-op Assessment: Post-op Vital signs reviewed, Patient's Cardiovascular Status Stable and Respiratory Function Stable  Post-op Vital Signs: Reviewed  Filed Vitals:   04/20/13 1130  BP: 123/68  Pulse: 60  Temp:   Resp: 13    Complications: No apparent anesthesia complications

## 2013-04-20 NOTE — Transfer of Care (Signed)
Immediate Anesthesia Transfer of Care Note  Patient: Dean Morgan, [name]  Procedure(s) Performed: Procedure(s): LEFT HAND WOUND DEBRIDEMENT AND CLOSURE  (Left)  Patient Location: PACU  Anesthesia Type:General  Level of Consciousness: awake, alert , oriented and patient cooperative  Airway & Oxygen Therapy: Patient Spontanous Breathing and Patient connected to face mask oxygen  Post-op Assessment: Report given to PACU RN and Post -op Vital signs reviewed and stable  Post vital signs: Reviewed and stable  Complications: No apparent anesthesia complications

## 2013-04-20 NOTE — Interval H&P Note (Signed)
History and Physical Interval Note:  04/20/2013 9:43 AM  [name], [name]  has presented today for surgery, with the diagnosis of LEFT HAND POST OP WOUND   The various methods of treatment have been discussed with the patient and family. After consideration of risks, benefits and other options for treatment, the patient has consented to  Procedure(s): LEFT HAND WOUND DEBRIDEMENT AND CLOSURE  (Left) as a surgical intervention .  The patient's history has been reviewed, patient examined, no change in status, stable for surgery.  I have reviewed the patient's chart and labs.  Questions were answered to the patient's satisfaction.     Jolyn Nap

## 2013-04-20 NOTE — Anesthesia Preprocedure Evaluation (Addendum)
Anesthesia Evaluation  Patient identified by MRN, date of birth, ID band Patient awake    Reviewed: Allergy & Precautions, H&P , NPO status , Patient's Chart, lab work & pertinent test results, reviewed documented beta blocker date and time   Airway Mallampati: III TM Distance: >3 FB Neck ROM: Full    Dental no notable dental hx. (+) Teeth Intact, Dental Advisory Given   Pulmonary sleep apnea and Continuous Positive Airway Pressure Ventilation ,  breath sounds clear to auscultation  Pulmonary exam normal       Cardiovascular hypertension, On Medications and On Home Beta Blockers + dysrhythmias Atrial Fibrillation + pacemaker Rhythm:Regular Rate:Normal     Neuro/Psych TIAnegative psych ROS   GI/Hepatic negative GI ROS, Neg liver ROS,   Endo/Other  negative endocrine ROS  Renal/GU negative Renal ROS  negative genitourinary   Musculoskeletal   Abdominal   Peds  Hematology negative hematology ROS (+)   Anesthesia Other Findings   Reproductive/Obstetrics negative OB ROS                          Anesthesia Physical Anesthesia Plan  ASA: III  Anesthesia Plan: General   Post-op Pain Management:    Induction: Intravenous  Airway Management Planned: LMA  Additional Equipment:   Intra-op Plan:   Post-operative Plan: Extubation in OR  Informed Consent: I have reviewed the patients History and Physical, chart, labs and discussed the procedure including the risks, benefits and alternatives for the proposed anesthesia with the patient or authorized representative who has indicated his/her understanding and acceptance.   Dental advisory given  Plan Discussed with: CRNA  Anesthesia Plan Comments:         Anesthesia Quick Evaluation

## 2013-04-21 LAB — MDC_IDC_ENUM_SESS_TYPE_REMOTE
Battery Voltage: 2.73 V
Brady Statistic RV Percent Paced: 100 %
Lead Channel Impedance Value: 0 Ohm
Lead Channel Impedance Value: 475 Ohm
Lead Channel Pacing Threshold Amplitude: 0.75 V
Lead Channel Setting Pacing Amplitude: 2 V
Lead Channel Setting Pacing Pulse Width: 0.4 ms
Lead Channel Setting Sensing Sensitivity: 5.6 mV
MDC IDC MSMT BATTERY IMPEDANCE: 2329 Ohm
MDC IDC MSMT BATTERY REMAINING LONGEVITY: 31 mo
MDC IDC MSMT LEADCHNL RV PACING THRESHOLD PULSEWIDTH: 0.4 ms
MDC IDC SESS DTM: 20150413102536

## 2013-04-23 ENCOUNTER — Ambulatory Visit (INDEPENDENT_AMBULATORY_CARE_PROVIDER_SITE_OTHER): Payer: Medicare Other | Admitting: *Deleted

## 2013-04-23 DIAGNOSIS — I442 Atrioventricular block, complete: Secondary | ICD-10-CM

## 2013-04-23 DIAGNOSIS — I4891 Unspecified atrial fibrillation: Secondary | ICD-10-CM

## 2013-04-23 DIAGNOSIS — Z7901 Long term (current) use of anticoagulants: Secondary | ICD-10-CM | POA: Diagnosis not present

## 2013-04-27 ENCOUNTER — Encounter (HOSPITAL_BASED_OUTPATIENT_CLINIC_OR_DEPARTMENT_OTHER): Payer: Self-pay | Admitting: Orthopedic Surgery

## 2013-04-28 DIAGNOSIS — S63659A Sprain of metacarpophalangeal joint of unspecified finger, initial encounter: Secondary | ICD-10-CM | POA: Diagnosis not present

## 2013-04-29 DIAGNOSIS — H905 Unspecified sensorineural hearing loss: Secondary | ICD-10-CM | POA: Diagnosis not present

## 2013-04-29 DIAGNOSIS — H903 Sensorineural hearing loss, bilateral: Secondary | ICD-10-CM | POA: Diagnosis not present

## 2013-04-29 DIAGNOSIS — J3489 Other specified disorders of nose and nasal sinuses: Secondary | ICD-10-CM | POA: Diagnosis not present

## 2013-04-30 DIAGNOSIS — S63659A Sprain of metacarpophalangeal joint of unspecified finger, initial encounter: Secondary | ICD-10-CM | POA: Diagnosis not present

## 2013-05-01 ENCOUNTER — Encounter: Payer: Self-pay | Admitting: *Deleted

## 2013-05-05 DIAGNOSIS — S63659A Sprain of metacarpophalangeal joint of unspecified finger, initial encounter: Secondary | ICD-10-CM | POA: Diagnosis not present

## 2013-05-06 ENCOUNTER — Encounter: Payer: Self-pay | Admitting: Cardiovascular Disease

## 2013-05-06 DIAGNOSIS — I4891 Unspecified atrial fibrillation: Secondary | ICD-10-CM | POA: Diagnosis not present

## 2013-05-06 DIAGNOSIS — Z7901 Long term (current) use of anticoagulants: Secondary | ICD-10-CM | POA: Diagnosis not present

## 2013-05-07 DIAGNOSIS — S63659A Sprain of metacarpophalangeal joint of unspecified finger, initial encounter: Secondary | ICD-10-CM | POA: Diagnosis not present

## 2013-05-12 DIAGNOSIS — S63659A Sprain of metacarpophalangeal joint of unspecified finger, initial encounter: Secondary | ICD-10-CM | POA: Diagnosis not present

## 2013-05-14 DIAGNOSIS — S63659A Sprain of metacarpophalangeal joint of unspecified finger, initial encounter: Secondary | ICD-10-CM | POA: Diagnosis not present

## 2013-05-19 DIAGNOSIS — S63659A Sprain of metacarpophalangeal joint of unspecified finger, initial encounter: Secondary | ICD-10-CM | POA: Diagnosis not present

## 2013-05-21 DIAGNOSIS — I4891 Unspecified atrial fibrillation: Secondary | ICD-10-CM | POA: Diagnosis not present

## 2013-05-21 DIAGNOSIS — Z7901 Long term (current) use of anticoagulants: Secondary | ICD-10-CM | POA: Diagnosis not present

## 2013-05-21 DIAGNOSIS — S63659A Sprain of metacarpophalangeal joint of unspecified finger, initial encounter: Secondary | ICD-10-CM | POA: Diagnosis not present

## 2013-05-26 DIAGNOSIS — S63659A Sprain of metacarpophalangeal joint of unspecified finger, initial encounter: Secondary | ICD-10-CM | POA: Diagnosis not present

## 2013-05-28 DIAGNOSIS — S63659A Sprain of metacarpophalangeal joint of unspecified finger, initial encounter: Secondary | ICD-10-CM | POA: Diagnosis not present

## 2013-06-02 DIAGNOSIS — S63659A Sprain of metacarpophalangeal joint of unspecified finger, initial encounter: Secondary | ICD-10-CM | POA: Diagnosis not present

## 2013-06-04 DIAGNOSIS — S63659A Sprain of metacarpophalangeal joint of unspecified finger, initial encounter: Secondary | ICD-10-CM | POA: Diagnosis not present

## 2013-06-04 DIAGNOSIS — T8130XA Disruption of wound, unspecified, initial encounter: Secondary | ICD-10-CM | POA: Diagnosis not present

## 2013-06-24 DIAGNOSIS — Z961 Presence of intraocular lens: Secondary | ICD-10-CM | POA: Diagnosis not present

## 2013-06-25 ENCOUNTER — Other Ambulatory Visit: Payer: Self-pay | Admitting: Dermatology

## 2013-06-25 DIAGNOSIS — D485 Neoplasm of uncertain behavior of skin: Secondary | ICD-10-CM | POA: Diagnosis not present

## 2013-06-25 DIAGNOSIS — I4891 Unspecified atrial fibrillation: Secondary | ICD-10-CM | POA: Diagnosis not present

## 2013-06-25 DIAGNOSIS — S63659A Sprain of metacarpophalangeal joint of unspecified finger, initial encounter: Secondary | ICD-10-CM | POA: Diagnosis not present

## 2013-06-25 DIAGNOSIS — L570 Actinic keratosis: Secondary | ICD-10-CM | POA: Diagnosis not present

## 2013-06-25 DIAGNOSIS — D1801 Hemangioma of skin and subcutaneous tissue: Secondary | ICD-10-CM | POA: Diagnosis not present

## 2013-06-25 DIAGNOSIS — Z7901 Long term (current) use of anticoagulants: Secondary | ICD-10-CM | POA: Diagnosis not present

## 2013-08-03 DIAGNOSIS — L02519 Cutaneous abscess of unspecified hand: Secondary | ICD-10-CM | POA: Diagnosis not present

## 2013-08-04 ENCOUNTER — Ambulatory Visit (INDEPENDENT_AMBULATORY_CARE_PROVIDER_SITE_OTHER): Payer: Medicare Other | Admitting: Cardiovascular Disease

## 2013-08-04 ENCOUNTER — Encounter: Payer: Self-pay | Admitting: Cardiovascular Disease

## 2013-08-04 VITALS — BP 140/84 | HR 81 | Resp 16 | Ht 69.0 in | Wt 205.4 lb

## 2013-08-04 DIAGNOSIS — Z950 Presence of cardiac pacemaker: Secondary | ICD-10-CM

## 2013-08-04 DIAGNOSIS — I4891 Unspecified atrial fibrillation: Secondary | ICD-10-CM | POA: Diagnosis not present

## 2013-08-04 DIAGNOSIS — I4821 Permanent atrial fibrillation: Secondary | ICD-10-CM

## 2013-08-04 DIAGNOSIS — Z7901 Long term (current) use of anticoagulants: Secondary | ICD-10-CM | POA: Diagnosis not present

## 2013-08-04 LAB — MDC_IDC_ENUM_SESS_TYPE_INCLINIC
Battery Impedance: 2436 Ohm
Battery Remaining Longevity: 30 mo
Brady Statistic RV Percent Paced: 100 %
Lead Channel Impedance Value: 481 Ohm
Lead Channel Pacing Threshold Amplitude: 0.5 V
Lead Channel Sensing Intrinsic Amplitude: 15.67 mV
Lead Channel Setting Pacing Amplitude: 2.5 V
Lead Channel Setting Pacing Pulse Width: 0.4 ms
MDC IDC MSMT BATTERY VOLTAGE: 2.72 V
MDC IDC MSMT LEADCHNL RA IMPEDANCE VALUE: 0 Ohm
MDC IDC MSMT LEADCHNL RV PACING THRESHOLD PULSEWIDTH: 0.4 ms
MDC IDC SESS DTM: 20150728122107
MDC IDC SET LEADCHNL RV SENSING SENSITIVITY: 5.6 mV

## 2013-08-04 LAB — PACEMAKER DEVICE OBSERVATION

## 2013-08-04 MED ORDER — AMLODIPINE-OLMESARTAN 10-40 MG PO TABS: 1.0000 | ORAL_TABLET | Freq: Every day | ORAL | Status: DC

## 2013-08-04 MED ORDER — NEBIVOLOL HCL 10 MG PO TABS: 10.0000 mg | ORAL_TABLET | Freq: Every day | ORAL | Status: DC

## 2013-08-04 MED ORDER — EZETIMIBE 10 MG PO TABS: 10.0000 mg | ORAL_TABLET | Freq: Every day | ORAL | Status: AC

## 2013-08-04 NOTE — Patient Instructions (Signed)
Remote monitoring is used to monitor your pacemaker from home. This monitoring reduces the number of office visits required to check your device to one time per year. It allows [place] to keep an eye on the functioning of your device to ensure it is working properly. You are scheduled for a device check from home on 11-04-2013. You may send your transmission at any time that day. If you have a wireless device, the transmission will be sent automatically. After your physician reviews your transmission, you will receive a postcard with your next transmission date.  Your physician recommends that you schedule a follow-up appointment in: 12 months with Dr.Croitoru

## 2013-08-04 NOTE — Progress Notes (Signed)
Patient ID: Dean Morgan, [name], male   DOB: Feb 21, 1931, 79 y.o.   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 He has no trouble with dyspnea, fatigue or angina or intermittent claudication or lower extremity edema. He has not had any focal neurological events or any bleeding complications while on warfarin. Unfortunately he has a recurrence of a left first metacarpophalangeal joint infection for which she had previously taken clindamycin for a protracted period of time. He has some purulent drainage which is just recultured and was started on ciprofloxacin. Dr. Raul Del office monitors his warfarin. Has not had fever or chills.  Allergies  Allergen Reactions  . Lipitor [Atorvastatin]     Muscle pain  . Penicillins     REACTION: rash  . Vytorin [Ezetimibe-Simvastatin]     Muscle pain    Current Outpatient Prescriptions  Medication Sig Dispense Refill  . amLODipine-olmesartan (AZOR) 10-40 MG per tablet Take 1 tablet by mouth daily.      . Arginine 1000 MG TABS Take by mouth daily.      Marland Kitchen aspirin 81 MG tablet Take 81 mg by mouth daily.      . celecoxib (CELEBREX) 200 MG capsule Take 200 mg by mouth daily.       . Cholecalciferol (VITAMIN D3) 5000 UNITS TABS Take by mouth daily.      . ciprofloxacin (CIPRO) 500 MG tablet Take 1 tablet by mouth 2 (two) times daily.      . Cyanocobalamin (VITAMIN B-12 PO) Take 2,500 mcg by mouth daily.      Marland Kitchen ezetimibe (ZETIA) 10 MG tablet Take 10 mg by mouth daily.      . fish oil-omega-3 fatty acids 1000 MG capsule Take 2 g by mouth daily.      . furosemide (LASIX) 20 MG tablet 20 mg as needed.       Marland Kitchen glucosamine-chondroitin 500-400 MG tablet Take 1 tablet by mouth 3 (three) times daily.      . nebivolol (BYSTOLIC) 10 MG tablet Take 10 mg by mouth daily.      . niacin (SLO-NIACIN) 500 MG tablet Take 500  mg by mouth 2 (two) times daily with a meal.      . potassium chloride SA (K-DUR,KLOR-CON) 20 MEQ tablet TAKE ONE TABLET BY MOUTH EVERY DAY  30 tablet  11  . testosterone (ANDROGEL) 50 MG/5GM GEL Place 5 g onto the skin daily.      Marland Kitchen warfarin (COUMADIN) 2 MG tablet Take 2 mg by mouth daily. tues,thurs      . warfarin (COUMADIN) 4 MG tablet Take 4 mg by mouth daily. All other days except tues thurs       No current facility-administered medications for this visit.    Past Medical History  Diagnosis Date  . Hypertension   . Dysrhythmia     AF  . Pacemaker   . Hyperlipemia   . Arthritis   . Wears glasses   . Wears hearing aid   . Sleep apnea     does use his cpap  . Pacemaker     Past Surgical History  Procedure Laterality Date  . Tonsillectomy    . Thumb arthroscopy  2007    rt thunb mass  . Shoulder arthroscopy  2004    left  . Knee arthroscopy  2/10    left  .  Pacemaker insertion  2009  . Cardioversion      multiple times-pacer put in 2009  . Colonoscopy    . Mass excision  01/29/2012    Procedure: EXCISION MASS;  Surgeon: Hessie Dibble, MD;  Location: [place];  Service: Orthopedics;  Laterality: Left;  excision of bone spur left hand  . Metacarpal osteotomy Left 01/13/2013    Procedure: LEFT INDEX FINGER MCP RADIAL COLLATERAL REPAIR/RECONSTRUCTION VS METACARPAL ROTATIONAL OSTEOTOMY;  Surgeon: Jolyn Nap, MD;  Location: [place];  Service: Orthopedics;  Laterality: Left;  . I&d extremity Left 04/20/2013    Procedure: LEFT HAND WOUND DEBRIDEMENT AND CLOSURE ;  Surgeon: Jolyn Nap, MD;  Location: [place];  Service: Orthopedics;  Laterality: Left;    No family history on file.  History   Social History  . Marital Status: Married    Spouse Name: N/A    Number of Children: N/A  . Years of Education: N/A   Occupational History  . Not on file.   Social History Main Topics  . Smoking status: Never  Smoker   . Smokeless tobacco: Not on file  . Alcohol Use: Yes     Comment: occ  . Drug Use: No  . Sexual Activity: Not on file   Other Topics Concern  . Not on file   Social History Narrative  . No narrative on file    Review of systems: The patient specifically denies any chest pain at rest or with exertion, dyspnea at rest or with exertion, orthopnea, paroxysmal nocturnal dyspnea, syncope, palpitations, focal neurological deficits, intermittent claudication, lower extremity edema, unexplained weight gain, cough, hemoptysis or wheezing.  The patient also denies abdominal pain, nausea, vomiting, dysphagia, diarrhea, constipation, polyuria, polydipsia, dysuria, hematuria, frequency, urgency, abnormal bleeding or bruising, fever, chills, unexpected weight changes, mood swings, change in skin or hair texture, change in voice quality, auditory or visual problems, allergic reactions or rashes, new musculoskeletal complaints other than usual "aches and pains".   PHYSICAL EXAM BP 140/84  Pulse 81  Resp 16  Ht 5\' 9"  (1.753 m)  Wt 205 lb 6.4 oz (93.169 kg)  BMI 30.32 kg/m2 General: Alert, oriented x3, no distress  Head: no evidence of trauma, PERRL, EOMI, no exophtalmos or lid lag, no myxedema, no xanthelasma; normal ears, nose and oropharynx  Neck: normal jugular venous pulsations and no hepatojugular reflux; brisk carotid pulses without delay and no carotid bruits  Chest: clear to auscultation, no signs of consolidation by percussion or palpation, normal fremitus, symmetrical and full respiratory excursions  Cardiovascular: normal position and quality of the apical impulse, regular rhythm, normal first and paradoxically split second heart sounds, no murmurs, rubs or gallops; healthy right subclavian pacemaker site  Abdomen: no tenderness or distention, no masses by palpation, no abnormal pulsatility or arterial bruits, normal bowel sounds, no hepatosplenomegaly  Extremities: no clubbing,  cyanosis or edema; 2+ radial, ulnar and brachial pulses bilaterally; 2+ right femoral, posterior tibial and dorsalis pedis pulses; 2+ left femoral, posterior tibial and dorsalis pedis pulses; no subclavian or femoral bruits  Neurological: grossly nonfocal   EKG: atrial fibrillation, 100% V paced  Lipid Panel     Component Value Date/Time   CHOL 164 06/10/2012 0815   TRIG 94 06/10/2012 0815   HDL 64 06/10/2012 0815   CHOLHDL 2.6 06/10/2012 0815   VLDL 19 06/10/2012 0815   LDLCALC 81 06/10/2012 0815    BMET    Component Value Date/Time  NA 144 04/20/2013 0922   K 3.6* 04/20/2013 0922   CL 108 04/20/2013 0922   CO2 27 01/12/2013 1200   GLUCOSE 87 04/20/2013 0922   BUN 24* 04/20/2013 0922   CREATININE 1.30 04/20/2013 0922   CREATININE 0.98 06/10/2012 0815   CALCIUM 9.0 01/12/2013 1200   GFRNONAA 55* 01/12/2013 1200   GFRAA 64* 01/12/2013 1200     ASSESSMENT AND PLAN  Atrial fibrillation, permanent  On appropriate warfarin anticoagulation. Needs frequent monitoring while on antibiotics. Complete heart block  Pacemaker dependent. Single-chamber Medtronic Adapta ADD SR 1 implanted April 2009, with normal function by recent [employer]. There are no episodes of rapid ventricular rate. There is no escape rhythm in the device is turned down to 30 beats per minute. The parameters are good and expected longevity is about another 2.5 years. Device heart rate histograms show adequate distribution.  OBSTRUCTIVE SLEEP APNEA  Reports compliance with CPAP  HTN (hypertension)  Fair control HYPERLIPIDEMIA  Intolerant to statins; normal nuclear stress test in August of 2012. On zetia and and niacin therapy: excellent lipid profile.   No evidence of systemic infection at this time. Keep in mind that secondary contamination of his pacemaker from remote infection sites could have disastrous consequences.   Meds ordered this encounter  Medications  . ciprofloxacin (CIPRO) 500 MG tablet    Sig: Take 1 tablet  by mouth 2 (two) times daily.    Holli Humbles, MD, [place] [phone] office [phone] pager

## 2013-08-05 ENCOUNTER — Encounter: Payer: Self-pay | Admitting: Cardiovascular Disease

## 2013-08-19 DIAGNOSIS — I4891 Unspecified atrial fibrillation: Secondary | ICD-10-CM | POA: Diagnosis not present

## 2013-08-19 DIAGNOSIS — Z7901 Long term (current) use of anticoagulants: Secondary | ICD-10-CM | POA: Diagnosis not present

## 2013-08-21 ENCOUNTER — Encounter: Payer: Self-pay | Admitting: Internal Medicine

## 2013-08-21 ENCOUNTER — Other Ambulatory Visit: Payer: Self-pay | Admitting: Internal Medicine

## 2013-08-21 ENCOUNTER — Ambulatory Visit (INDEPENDENT_AMBULATORY_CARE_PROVIDER_SITE_OTHER): Payer: Medicare Other | Admitting: Internal Medicine

## 2013-08-21 ENCOUNTER — Ambulatory Visit (HOSPITAL_COMMUNITY)
Admission: RE | Admit: 2013-08-21 | Discharge: 2013-08-21 | Disposition: A | Discharge status: Home / Self Care | Payer: Medicare Other | Source: Ambulatory Visit | Attending: Internal Medicine | Admitting: Internal Medicine

## 2013-08-21 VITALS — BP 147/91 | HR 88 | Temp 97.8°F | Ht 69.0 in | Wt 205.0 lb

## 2013-08-21 DIAGNOSIS — M869 Osteomyelitis, unspecified: Secondary | ICD-10-CM | POA: Diagnosis not present

## 2013-08-21 DIAGNOSIS — Z452 Encounter for adjustment and management of vascular access device: Secondary | ICD-10-CM | POA: Diagnosis not present

## 2013-08-21 LAB — CBC WITH DIFFERENTIAL/PLATELET
Basophils Absolute: 0.1 10*3/uL (ref 0.0–0.1)
Basophils Relative: 1 % (ref 0–1)
Eosinophils Absolute: 0.3 10*3/uL (ref 0.0–0.7)
Eosinophils Relative: 4 % (ref 0–5)
HCT: 46.1 % (ref 39.0–52.0)
Hemoglobin: 16.3 g/dL (ref 13.0–17.0)
LYMPHS ABS: 1.6 10*3/uL (ref 0.7–4.0)
Lymphocytes Relative: 23 % (ref 12–46)
MCH: 31.5 pg (ref 26.0–34.0)
MCHC: 35.4 g/dL (ref 30.0–36.0)
MCV: 89.2 fL (ref 78.0–100.0)
MONO ABS: 0.7 10*3/uL (ref 0.1–1.0)
Monocytes Relative: 10 % (ref 3–12)
NEUTROS ABS: 4.4 10*3/uL (ref 1.7–7.7)
Neutrophils Relative %: 62 % (ref 43–77)
Platelets: 175 10*3/uL (ref 150–400)
RBC: 5.17 MIL/uL (ref 4.22–5.81)
RDW: 13.7 % (ref 11.5–15.5)
WBC: 7.1 10*3/uL (ref 4.0–10.5)

## 2013-08-21 LAB — COMPLETE METABOLIC PANEL WITH GFR
ALT: 23 U/L (ref 0–53)
AST: 33 U/L (ref 0–37)
Albumin: 4.3 g/dL (ref 3.5–5.2)
Alkaline Phosphatase: 51 U/L (ref 39–117)
BUN: 23 mg/dL (ref 6–23)
CALCIUM: 9.6 mg/dL (ref 8.4–10.5)
CO2: 27 mEq/L (ref 19–32)
Chloride: 106 mEq/L (ref 96–112)
Creat: 1.09 mg/dL (ref 0.50–1.35)
GFR, Est African American: 74 mL/min
GFR, Est Non African American: 64 mL/min
Glucose, Bld: 86 mg/dL (ref 70–99)
Potassium: 4.4 mEq/L (ref 3.5–5.3)
Sodium: 143 mEq/L (ref 135–145)
Total Bilirubin: 0.6 mg/dL (ref 0.2–1.2)
Total Protein: 7.6 g/dL (ref 6.0–8.3)

## 2013-08-21 LAB — C-REACTIVE PROTEIN: CRP: 0.5 mg/dL (ref <0.60)

## 2013-08-21 MED ORDER — LIDOCAINE HCL 1 % IJ SOLN
INTRAMUSCULAR | Status: AC
  Filled 2013-08-21: qty 20

## 2013-08-21 MED ORDER — SODIUM CHLORIDE 0.9 % IV SOLN: 1.0000 g | INTRAVENOUS | Status: DC

## 2013-08-21 MED ORDER — HEPARIN SOD (PORK) LOCK FLUSH 100 UNIT/ML IV SOLN
250.0000 [IU] | Freq: Once | INTRAVENOUS | Status: AC
  Administered 2013-08-21: 250 [IU] via INTRAVENOUS

## 2013-08-21 MED ORDER — SODIUM CHLORIDE 0.9 % IV SOLN
1.0000 g | Freq: Once | INTRAVENOUS | Status: AC
  Administered 2013-08-21: 1 g via INTRAVENOUS
  Filled 2013-08-21: qty 1

## 2013-08-21 MED ORDER — SODIUM CHLORIDE 0.9 % IJ SOLN
10.0000 mL | Freq: Once | INTRAMUSCULAR | Status: AC
  Administered 2013-08-21: 10 mL via INTRAVENOUS

## 2013-08-21 NOTE — Procedures (Signed)
Procedure:  Left arm PICC line Findings:  44 cm SL Power PICC placed.  Tip at cavoatrial junction.  OK to use.

## 2013-08-21 NOTE — Assessment & Plan Note (Signed)
Culture does show MSSA, sensitive to penicillin as well as everything else.  Culture though was wound culture, not bone culture.  Therefore, I am going to treat a little more broadly with Invanz.  Will try for about 5 weeks prior to him leaving for vacation and will continue with pills such as amoxicillin after for continuation.  I will check crp and ESR baseline today.  He is scheduled for picc line today, first dose in short stay and [facility] to come out tomorrow (Sat) for dose and training on Sunday (wife).  FU in 2 weeks.  Weekly labs to [facility].

## 2013-08-21 NOTE — Progress Notes (Signed)
   Subjective:    Patient ID: Dean Morgan, [name], male    DOB: December 30, 1931, 80 y.o.   MRN: [id number]  HPI Here for evaluation for concern for osteomyelitis.  He has a remote history of surgery for a bone spur and underwent surgery for left index finger MCP instabiity and had MCP capsulotomy and radial collateral ligament repair.  It initially was healing but then developed drainage and infection.  He was placed on clindamycin and took that for several weeks.  It initially improved but got worse again and drained and after seeing Dr. Grandville Silos started on cipro based on a culture done with MSSA.  Some improvement but xray done by Dr. Grandville Silos c/w osteomyelitis.  He is allergic to penicillin and develops hives, his mother had swelling.  He does not recall ever taking a cephalosporin.  He is on coumadin for afib and INR 2.7.     Review of Systems  Constitutional: Negative for fever and chills.  Gastrointestinal: Negative for nausea and diarrhea.  Skin: Positive for wound. Negative for rash.  Neurological: Negative for dizziness and light-headedness.       Objective:   Physical Exam  Constitutional: He appears well-developed and well-nourished. No distress.  HENT:  Mouth/Throat: No oropharyngeal exudate.  Eyes: No scleral icterus.  Cardiovascular: Normal rate, regular rhythm and normal heart sounds.   No murmur heard. Pulmonary/Chest: Breath sounds normal. No respiratory distress. He has no wheezes.  Lymphadenopathy:    He has no cervical adenopathy.  Skin:  Finger now is closed with no drainage, no erythema          Assessment & Plan:

## 2013-08-22 DIAGNOSIS — M869 Osteomyelitis, unspecified: Secondary | ICD-10-CM | POA: Diagnosis not present

## 2013-08-22 DIAGNOSIS — Z452 Encounter for adjustment and management of vascular access device: Secondary | ICD-10-CM | POA: Diagnosis not present

## 2013-08-22 DIAGNOSIS — I4891 Unspecified atrial fibrillation: Secondary | ICD-10-CM | POA: Diagnosis not present

## 2013-08-22 DIAGNOSIS — A4901 Methicillin susceptible Staphylococcus aureus infection, unspecified site: Secondary | ICD-10-CM | POA: Diagnosis not present

## 2013-08-22 DIAGNOSIS — T8140XA Infection following a procedure, unspecified, initial encounter: Secondary | ICD-10-CM | POA: Diagnosis not present

## 2013-08-22 DIAGNOSIS — Z7901 Long term (current) use of anticoagulants: Secondary | ICD-10-CM | POA: Diagnosis not present

## 2013-08-22 LAB — SEDIMENTATION RATE: SED RATE: 5 mm/h (ref 0–16)

## 2013-08-23 DIAGNOSIS — I4891 Unspecified atrial fibrillation: Secondary | ICD-10-CM | POA: Diagnosis not present

## 2013-08-23 DIAGNOSIS — Z7901 Long term (current) use of anticoagulants: Secondary | ICD-10-CM | POA: Diagnosis not present

## 2013-08-23 DIAGNOSIS — A4901 Methicillin susceptible Staphylococcus aureus infection, unspecified site: Secondary | ICD-10-CM | POA: Diagnosis not present

## 2013-08-23 DIAGNOSIS — Z452 Encounter for adjustment and management of vascular access device: Secondary | ICD-10-CM | POA: Diagnosis not present

## 2013-08-23 DIAGNOSIS — M869 Osteomyelitis, unspecified: Secondary | ICD-10-CM | POA: Diagnosis not present

## 2013-08-23 DIAGNOSIS — T8140XA Infection following a procedure, unspecified, initial encounter: Secondary | ICD-10-CM | POA: Diagnosis not present

## 2013-08-24 DIAGNOSIS — T8140XA Infection following a procedure, unspecified, initial encounter: Secondary | ICD-10-CM | POA: Diagnosis not present

## 2013-08-24 DIAGNOSIS — A4901 Methicillin susceptible Staphylococcus aureus infection, unspecified site: Secondary | ICD-10-CM | POA: Diagnosis not present

## 2013-08-24 DIAGNOSIS — Z452 Encounter for adjustment and management of vascular access device: Secondary | ICD-10-CM | POA: Diagnosis not present

## 2013-08-24 DIAGNOSIS — Z7901 Long term (current) use of anticoagulants: Secondary | ICD-10-CM | POA: Diagnosis not present

## 2013-08-24 DIAGNOSIS — M869 Osteomyelitis, unspecified: Secondary | ICD-10-CM | POA: Diagnosis not present

## 2013-08-24 DIAGNOSIS — I4891 Unspecified atrial fibrillation: Secondary | ICD-10-CM | POA: Diagnosis not present

## 2013-08-25 DIAGNOSIS — Z452 Encounter for adjustment and management of vascular access device: Secondary | ICD-10-CM | POA: Diagnosis not present

## 2013-08-25 DIAGNOSIS — M869 Osteomyelitis, unspecified: Secondary | ICD-10-CM | POA: Diagnosis not present

## 2013-08-25 DIAGNOSIS — A4901 Methicillin susceptible Staphylococcus aureus infection, unspecified site: Secondary | ICD-10-CM | POA: Diagnosis not present

## 2013-08-25 DIAGNOSIS — Z7901 Long term (current) use of anticoagulants: Secondary | ICD-10-CM | POA: Diagnosis not present

## 2013-08-25 DIAGNOSIS — T8140XA Infection following a procedure, unspecified, initial encounter: Secondary | ICD-10-CM | POA: Diagnosis not present

## 2013-08-25 DIAGNOSIS — I4891 Unspecified atrial fibrillation: Secondary | ICD-10-CM | POA: Diagnosis not present

## 2013-08-25 DIAGNOSIS — Z794 Long term (current) use of insulin: Secondary | ICD-10-CM | POA: Diagnosis not present

## 2013-09-01 DIAGNOSIS — Z452 Encounter for adjustment and management of vascular access device: Secondary | ICD-10-CM | POA: Diagnosis not present

## 2013-09-01 DIAGNOSIS — L02519 Cutaneous abscess of unspecified hand: Secondary | ICD-10-CM | POA: Diagnosis not present

## 2013-09-01 DIAGNOSIS — L03019 Cellulitis of unspecified finger: Secondary | ICD-10-CM | POA: Diagnosis not present

## 2013-09-01 DIAGNOSIS — T8140XA Infection following a procedure, unspecified, initial encounter: Secondary | ICD-10-CM | POA: Diagnosis not present

## 2013-09-01 DIAGNOSIS — M869 Osteomyelitis, unspecified: Secondary | ICD-10-CM | POA: Diagnosis not present

## 2013-09-01 DIAGNOSIS — Z7901 Long term (current) use of anticoagulants: Secondary | ICD-10-CM | POA: Diagnosis not present

## 2013-09-01 DIAGNOSIS — A4901 Methicillin susceptible Staphylococcus aureus infection, unspecified site: Secondary | ICD-10-CM | POA: Diagnosis not present

## 2013-09-01 DIAGNOSIS — I4891 Unspecified atrial fibrillation: Secondary | ICD-10-CM | POA: Diagnosis not present

## 2013-09-01 DIAGNOSIS — Z79899 Other long term (current) drug therapy: Secondary | ICD-10-CM | POA: Diagnosis not present

## 2013-09-02 ENCOUNTER — Ambulatory Visit: Payer: Medicare Other | Admitting: Internal Medicine

## 2013-09-02 DIAGNOSIS — I4891 Unspecified atrial fibrillation: Secondary | ICD-10-CM | POA: Diagnosis not present

## 2013-09-02 DIAGNOSIS — Z7901 Long term (current) use of anticoagulants: Secondary | ICD-10-CM | POA: Diagnosis not present

## 2013-09-08 DIAGNOSIS — A4901 Methicillin susceptible Staphylococcus aureus infection, unspecified site: Secondary | ICD-10-CM | POA: Diagnosis not present

## 2013-09-08 DIAGNOSIS — I4891 Unspecified atrial fibrillation: Secondary | ICD-10-CM | POA: Diagnosis not present

## 2013-09-08 DIAGNOSIS — Z7901 Long term (current) use of anticoagulants: Secondary | ICD-10-CM | POA: Diagnosis not present

## 2013-09-08 DIAGNOSIS — Z452 Encounter for adjustment and management of vascular access device: Secondary | ICD-10-CM | POA: Diagnosis not present

## 2013-09-08 DIAGNOSIS — T8140XA Infection following a procedure, unspecified, initial encounter: Secondary | ICD-10-CM | POA: Diagnosis not present

## 2013-09-08 DIAGNOSIS — M869 Osteomyelitis, unspecified: Secondary | ICD-10-CM | POA: Diagnosis not present

## 2013-09-09 ENCOUNTER — Ambulatory Visit (INDEPENDENT_AMBULATORY_CARE_PROVIDER_SITE_OTHER): Payer: Medicare Other | Admitting: Internal Medicine

## 2013-09-09 ENCOUNTER — Encounter: Payer: Self-pay | Admitting: Internal Medicine

## 2013-09-09 VITALS — BP 137/89 | HR 87 | Temp 97.2°F | Wt 202.0 lb

## 2013-09-09 DIAGNOSIS — M869 Osteomyelitis, unspecified: Secondary | ICD-10-CM

## 2013-09-09 DIAGNOSIS — Z7901 Long term (current) use of anticoagulants: Secondary | ICD-10-CM | POA: Diagnosis not present

## 2013-09-09 DIAGNOSIS — I4891 Unspecified atrial fibrillation: Secondary | ICD-10-CM | POA: Diagnosis not present

## 2013-09-09 NOTE — Assessment & Plan Note (Signed)
Doing well with no issues with treatment.  Feels better subjectively.  Will continue for 6 weeks through Sep 25th. If still ok that week, will not need oral continuation therapy.  Baseline ESR and CRP are wnl so no indication to recheck.

## 2013-09-09 NOTE — Progress Notes (Signed)
   Subjective:    Patient ID: Dean Morgan, [name], male    DOB: [birth year], 80 y.o.   MRN: [id number]  HPI Here for follow up for osteomyelitis.  He has a remote history of surgery for a bone spur and underwent surgery for left index finger MCP instabiity and had MCP capsulotomy and radial collateral ligament repair.  It initially was healing but then developed drainage and infection.  He was placed on clindamycin and took that for several weeks.  It initially improved but got worse again and drained and after seeing Dr. Grandville Silos started on cipro based on a culture done with MSSA.  Some improvement but xray done by Dr. Grandville Silos c/w osteomyelitis.  He is allergic to penicillin and develops hives, his mother had swelling.  He does not recall ever taking a cephalosporin.  He is on coumadin for afib and INR being followed closely at [facility].      Review of Systems  Constitutional: Negative for fever and chills.  Gastrointestinal: Negative for nausea and diarrhea.  Skin: Negative for rash and wound.       Closed wound  Neurological: Negative for dizziness and light-headedness.       Objective:   Physical Exam  Constitutional: He appears well-developed and well-nourished. No distress.  Eyes: No scleral icterus.  Skin:  Finger remains closed with no erythema          Assessment & Plan:

## 2013-09-15 ENCOUNTER — Telehealth: Payer: Self-pay | Admitting: *Deleted

## 2013-09-15 DIAGNOSIS — A4901 Methicillin susceptible Staphylococcus aureus infection, unspecified site: Secondary | ICD-10-CM | POA: Diagnosis not present

## 2013-09-15 DIAGNOSIS — Z7901 Long term (current) use of anticoagulants: Secondary | ICD-10-CM | POA: Diagnosis not present

## 2013-09-15 DIAGNOSIS — T8140XA Infection following a procedure, unspecified, initial encounter: Secondary | ICD-10-CM | POA: Diagnosis not present

## 2013-09-15 DIAGNOSIS — Z452 Encounter for adjustment and management of vascular access device: Secondary | ICD-10-CM | POA: Diagnosis not present

## 2013-09-15 DIAGNOSIS — I4891 Unspecified atrial fibrillation: Secondary | ICD-10-CM | POA: Diagnosis not present

## 2013-09-15 DIAGNOSIS — M869 Osteomyelitis, unspecified: Secondary | ICD-10-CM | POA: Diagnosis not present

## 2013-09-15 NOTE — Telephone Encounter (Signed)
Yes to add visit.  No additional labs.  Pull picc after dose on 9/25. thanks

## 2013-09-15 NOTE — Telephone Encounter (Signed)
Danielle, [facility] RN, called for orders.  Patient is not scheduled for a weekly home visit with [facility] on 9/22. Daniell, [facility] RN, would like permission to add this weekly nursing visit for labs, PICC dressing change.  Patient is to follow up 9/24, scheduled to complete iv abx 9/25.  Please advise if it is appropriate for the nurse to add this visit on 9/22, and if there are any additional labs you would like drawn at this visit.  Please also advise when the PICC should be pulled. Landis Gandy, RN

## 2013-09-15 NOTE — Telephone Encounter (Signed)
Relayed orders. Thank you

## 2013-09-16 ENCOUNTER — Ambulatory Visit (INDEPENDENT_AMBULATORY_CARE_PROVIDER_SITE_OTHER): Payer: Medicare Other | Admitting: Internal Medicine

## 2013-09-16 ENCOUNTER — Encounter: Payer: Self-pay | Admitting: Internal Medicine

## 2013-09-16 VITALS — BP 143/89 | HR 82 | Temp 98.4°F | Wt 203.0 lb

## 2013-09-16 DIAGNOSIS — M869 Osteomyelitis, unspecified: Secondary | ICD-10-CM

## 2013-09-16 NOTE — Assessment & Plan Note (Signed)
Exam reassuring.  Is blanching and consistent with blood vessels, not cellulitis or infection.  Discussed with pt.  RTC at end of treatment.  Pull picc at end of treatment.

## 2013-09-16 NOTE — Progress Notes (Signed)
   Subjective:    Patient ID: Dean Morgan, [name], male    DOB: 07-30-1931, 80 y.o.   MRN: [id number]  HPI Here for follow up for osteomyelitis.  He has a remote history of surgery for a bone spur and underwent surgery for left index finger MCP instabiity and had MCP capsulotomy and radial collateral ligament repair.  It initially was healing but then developed drainage and infection.  He was placed on clindamycin and took that for several weeks.  It initially improved but got worse again and drained and after seeing Dr. Grandville Silos started on cipro based on a culture done with MSSA.  Some improvement but xray done by Dr. Grandville Silos c/w osteomyelitis.  He is allergic to penicillin and develops hives, his mother had swelling.  He does not recall ever taking a cephalosporin.  He is on coumadin for afib and INR being followed closely at [facility].        Doing well but came in today for a work in visit with concern of it worsening.  Developed a small blister and redness in the area.  No fever, no chills.         Review of Systems  Constitutional: Negative for fever and chills.  Gastrointestinal: Negative for nausea and diarrhea.  Skin: Negative for rash and wound.       Closed wound  Neurological: Negative for dizziness and light-headedness.       Objective:   Physical Exam  Constitutional: He appears well-developed and well-nourished. No distress.  Eyes: No scleral icterus.  Skin:  Finger with small area at same location with blanching erythema, blister over area with dark fluid inside.  No warmth, no tenderness.           Assessment & Plan:

## 2013-09-22 ENCOUNTER — Telehealth: Payer: Self-pay

## 2013-09-22 ENCOUNTER — Other Ambulatory Visit: Payer: Self-pay | Admitting: Dermatology

## 2013-09-22 DIAGNOSIS — A4901 Methicillin susceptible Staphylococcus aureus infection, unspecified site: Secondary | ICD-10-CM | POA: Diagnosis not present

## 2013-09-22 DIAGNOSIS — M869 Osteomyelitis, unspecified: Secondary | ICD-10-CM | POA: Diagnosis not present

## 2013-09-22 DIAGNOSIS — I4891 Unspecified atrial fibrillation: Secondary | ICD-10-CM | POA: Diagnosis not present

## 2013-09-22 DIAGNOSIS — D485 Neoplasm of uncertain behavior of skin: Secondary | ICD-10-CM | POA: Diagnosis not present

## 2013-09-22 DIAGNOSIS — L570 Actinic keratosis: Secondary | ICD-10-CM | POA: Diagnosis not present

## 2013-09-22 DIAGNOSIS — Z7901 Long term (current) use of anticoagulants: Secondary | ICD-10-CM | POA: Diagnosis not present

## 2013-09-22 DIAGNOSIS — D0420 Carcinoma in situ of skin of unspecified ear and external auricular canal: Secondary | ICD-10-CM | POA: Diagnosis not present

## 2013-09-22 DIAGNOSIS — Z452 Encounter for adjustment and management of vascular access device: Secondary | ICD-10-CM | POA: Diagnosis not present

## 2013-09-22 DIAGNOSIS — T8140XA Infection following a procedure, unspecified, initial encounter: Secondary | ICD-10-CM | POA: Diagnosis not present

## 2013-09-29 DIAGNOSIS — M869 Osteomyelitis, unspecified: Secondary | ICD-10-CM | POA: Diagnosis not present

## 2013-09-29 DIAGNOSIS — Z7901 Long term (current) use of anticoagulants: Secondary | ICD-10-CM | POA: Diagnosis not present

## 2013-09-29 DIAGNOSIS — T8140XA Infection following a procedure, unspecified, initial encounter: Secondary | ICD-10-CM | POA: Diagnosis not present

## 2013-09-29 DIAGNOSIS — Z452 Encounter for adjustment and management of vascular access device: Secondary | ICD-10-CM | POA: Diagnosis not present

## 2013-09-29 DIAGNOSIS — A4901 Methicillin susceptible Staphylococcus aureus infection, unspecified site: Secondary | ICD-10-CM | POA: Diagnosis not present

## 2013-09-29 DIAGNOSIS — I4891 Unspecified atrial fibrillation: Secondary | ICD-10-CM | POA: Diagnosis not present

## 2013-10-01 ENCOUNTER — Ambulatory Visit (INDEPENDENT_AMBULATORY_CARE_PROVIDER_SITE_OTHER): Payer: Medicare Other | Admitting: Internal Medicine

## 2013-10-01 ENCOUNTER — Encounter: Payer: Self-pay | Admitting: Internal Medicine

## 2013-10-01 VITALS — BP 138/87 | HR 91 | Temp 97.4°F | Wt 209.0 lb

## 2013-10-01 DIAGNOSIS — I4891 Unspecified atrial fibrillation: Secondary | ICD-10-CM | POA: Diagnosis not present

## 2013-10-01 DIAGNOSIS — Z7901 Long term (current) use of anticoagulants: Secondary | ICD-10-CM | POA: Diagnosis not present

## 2013-10-01 DIAGNOSIS — M869 Osteomyelitis, unspecified: Secondary | ICD-10-CM | POA: Diagnosis not present

## 2013-10-01 NOTE — Assessment & Plan Note (Signed)
Doing great.  No concerns on exam or with labs.  Will finish antibiotics tomorrow and stop.  [facility] to pull picc line.

## 2013-10-01 NOTE — Progress Notes (Signed)
   Subjective:    Patient ID: [name], [name], male    DOB: 1931-07-27, 80 y.o.   MRN: [id number]  HPI Here for follow up for osteomyelitis.  He has a remote history of surgery for a bone spur and underwent surgery for left index finger MCP instabiity and had MCP capsulotomy and radial collateral ligament repair.  It initially was healing but then developed drainage and infection.  He was placed on clindamycin and took that for several weeks.  It initially improved but got worse again and drained and after seeing Dr. Grandville Silos started on cipro based on a culture done with MSSA.  Some improvement but xray done by Dr. Grandville Silos c/w osteomyelitis.  He is allergic to penicillin and develops hives, his mother had swelling.  He does not recall ever taking a cephalosporin.  He is on coumadin for afib and INR being followed closely at [facility].        Doing well and now has completed 6 weeks of IV ertapenem.  No fever, no chills, no diarrhea.  Small scab in area but no erythema, no swelling, no pain.        Review of Systems  Constitutional: Negative for fever and chills.  Gastrointestinal: Negative for nausea and diarrhea.  Skin: Negative for rash and wound.       Closed wound  Neurological: Negative for dizziness and light-headedness.       Objective:   Physical Exam  Constitutional: He appears well-developed and well-nourished. No distress.  Eyes: No scleral icterus.  Skin:  Finger with small area scab but no swelling, no erythema          Assessment & Plan:

## 2013-10-02 ENCOUNTER — Telehealth: Payer: Self-pay | Admitting: *Deleted

## 2013-10-02 DIAGNOSIS — Z7901 Long term (current) use of anticoagulants: Secondary | ICD-10-CM | POA: Diagnosis not present

## 2013-10-02 DIAGNOSIS — M869 Osteomyelitis, unspecified: Secondary | ICD-10-CM | POA: Diagnosis not present

## 2013-10-02 DIAGNOSIS — A4901 Methicillin susceptible Staphylococcus aureus infection, unspecified site: Secondary | ICD-10-CM | POA: Diagnosis not present

## 2013-10-02 DIAGNOSIS — Z452 Encounter for adjustment and management of vascular access device: Secondary | ICD-10-CM | POA: Diagnosis not present

## 2013-10-02 DIAGNOSIS — I4891 Unspecified atrial fibrillation: Secondary | ICD-10-CM | POA: Diagnosis not present

## 2013-10-02 DIAGNOSIS — T8140XA Infection following a procedure, unspecified, initial encounter: Secondary | ICD-10-CM | POA: Diagnosis not present

## 2013-10-02 NOTE — Telephone Encounter (Signed)
Verbal order given to Melissa at [facility] to pull patient's picc line today per Dr. Linus Salmons. Myrtis Hopping

## 2013-10-07 ENCOUNTER — Telehealth: Payer: Self-pay | Admitting: Internal Medicine

## 2013-10-07 DIAGNOSIS — G4733 Obstructive sleep apnea (adult) (pediatric): Secondary | ICD-10-CM

## 2013-10-07 NOTE — Telephone Encounter (Signed)
Spoke with Melissa at [facility].  She received an order for replacement cpap from Dr Marton Redwood.  When she called to get ov notes for documentation they told her that pt sees Dr Annamaria Boots for sleep and this would need to come from him.  Pt hasnt been seen since 2011.  Please advise if pt needs to schedule ov or if Dr Raul Del office should continue to handle this.

## 2013-10-07 NOTE — Telephone Encounter (Signed)
Suggest we send order to [facility] for replacement CPAP machine at current pressure, mask of choice, humidifier, supplies for dx OSA     And also help patient schedule to re-establish here for OSA

## 2013-10-07 NOTE — Telephone Encounter (Signed)
LMTC x 1  

## 2013-10-07 NOTE — Telephone Encounter (Signed)
LMTCB for the pt to schedule appt to re-est Order sent to [facility] for CPAP and supplies

## 2013-10-07 NOTE — Telephone Encounter (Signed)
Melissa returned call [phone]

## 2013-10-08 NOTE — Telephone Encounter (Signed)
Pt returned call. Informed him of the new CPAP order and needing to re-establish as a new pt with [facility]. Pt verbalized understanding. Pt stated d/t being out of town he is unable to schedule an appt at this time but will call back once he is in town to schedule appt.   Will forward to Katie to f/u on.

## 2013-10-21 ENCOUNTER — Encounter: Payer: Self-pay | Admitting: Internal Medicine

## 2013-10-27 DIAGNOSIS — I480 Paroxysmal atrial fibrillation: Secondary | ICD-10-CM | POA: Diagnosis not present

## 2013-10-27 DIAGNOSIS — Z7901 Long term (current) use of anticoagulants: Secondary | ICD-10-CM | POA: Diagnosis not present

## 2013-11-05 ENCOUNTER — Ambulatory Visit (INDEPENDENT_AMBULATORY_CARE_PROVIDER_SITE_OTHER): Payer: Medicare Other | Admitting: *Deleted

## 2013-11-05 DIAGNOSIS — I442 Atrioventricular block, complete: Secondary | ICD-10-CM | POA: Diagnosis not present

## 2013-11-05 NOTE — Progress Notes (Signed)
Remote pacemaker transmission.   

## 2013-11-16 LAB — MDC_IDC_ENUM_SESS_TYPE_REMOTE
Battery Impedance: 2729 Ohm
Battery Remaining Longevity: 24 mo
Battery Voltage: 2.72 V
Brady Statistic RV Percent Paced: 100 %
Date Time Interrogation Session: 20151029102445
Lead Channel Impedance Value: 0 Ohm
Lead Channel Impedance Value: 439 Ohm
Lead Channel Pacing Threshold Amplitude: 0.75 V
Lead Channel Pacing Threshold Pulse Width: 0.4 ms
Lead Channel Setting Pacing Amplitude: 2.5 V
Lead Channel Setting Pacing Pulse Width: 0.4 ms
Lead Channel Setting Sensing Sensitivity: 5.6 mV

## 2013-11-25 ENCOUNTER — Emergency Department (HOSPITAL_COMMUNITY)
Admission: EM | Admit: 2013-11-25 | Discharge: 2013-11-26 | Disposition: A | Discharge status: Home / Self Care | Payer: Medicare Other | Attending: Emergency Medicine | Admitting: Emergency Medicine

## 2013-11-25 ENCOUNTER — Encounter (HOSPITAL_COMMUNITY): Payer: Self-pay | Admitting: Emergency Medicine

## 2013-11-25 DIAGNOSIS — Z950 Presence of cardiac pacemaker: Secondary | ICD-10-CM | POA: Insufficient documentation

## 2013-11-25 DIAGNOSIS — G4730 Sleep apnea, unspecified: Secondary | ICD-10-CM | POA: Insufficient documentation

## 2013-11-25 DIAGNOSIS — Z79899 Other long term (current) drug therapy: Secondary | ICD-10-CM | POA: Diagnosis not present

## 2013-11-25 DIAGNOSIS — Z880 Allergy status to penicillin: Secondary | ICD-10-CM | POA: Insufficient documentation

## 2013-11-25 DIAGNOSIS — R040 Epistaxis: Secondary | ICD-10-CM | POA: Insufficient documentation

## 2013-11-25 DIAGNOSIS — Z7901 Long term (current) use of anticoagulants: Secondary | ICD-10-CM | POA: Insufficient documentation

## 2013-11-25 DIAGNOSIS — Z973 Presence of spectacles and contact lenses: Secondary | ICD-10-CM | POA: Insufficient documentation

## 2013-11-25 DIAGNOSIS — Z974 Presence of external hearing-aid: Secondary | ICD-10-CM | POA: Insufficient documentation

## 2013-11-25 DIAGNOSIS — Z7982 Long term (current) use of aspirin: Secondary | ICD-10-CM | POA: Diagnosis not present

## 2013-11-25 DIAGNOSIS — Z9981 Dependence on supplemental oxygen: Secondary | ICD-10-CM | POA: Insufficient documentation

## 2013-11-25 DIAGNOSIS — Z791 Long term (current) use of non-steroidal anti-inflammatories (NSAID): Secondary | ICD-10-CM | POA: Diagnosis not present

## 2013-11-25 DIAGNOSIS — Z9889 Other specified postprocedural states: Secondary | ICD-10-CM | POA: Insufficient documentation

## 2013-11-25 DIAGNOSIS — I10 Essential (primary) hypertension: Secondary | ICD-10-CM | POA: Insufficient documentation

## 2013-11-25 DIAGNOSIS — M1990 Unspecified osteoarthritis, unspecified site: Secondary | ICD-10-CM | POA: Diagnosis not present

## 2013-11-25 DIAGNOSIS — E785 Hyperlipidemia, unspecified: Secondary | ICD-10-CM | POA: Diagnosis not present

## 2013-11-25 HISTORY — DX: Unspecified atrial fibrillation: I48.91

## 2013-11-25 NOTE — ED Notes (Signed)
Pt present with L epistaxis onset 1600 today, bright red bleeding noted. Pt is on coumadin for afib.

## 2013-11-26 DIAGNOSIS — Z7901 Long term (current) use of anticoagulants: Secondary | ICD-10-CM | POA: Diagnosis not present

## 2013-11-26 DIAGNOSIS — I480 Paroxysmal atrial fibrillation: Secondary | ICD-10-CM | POA: Diagnosis not present

## 2013-11-26 DIAGNOSIS — R040 Epistaxis: Secondary | ICD-10-CM | POA: Diagnosis not present

## 2013-11-26 LAB — CBC
HEMATOCRIT: 41.4 % (ref 39.0–52.0)
Hemoglobin: 14.3 g/dL (ref 13.0–17.0)
MCH: 31.3 pg (ref 26.0–34.0)
MCHC: 34.5 g/dL (ref 30.0–36.0)
MCV: 90.6 fL (ref 78.0–100.0)
Platelets: 155 10*3/uL (ref 150–400)
RBC: 4.57 MIL/uL (ref 4.22–5.81)
RDW: 13.7 % (ref 11.5–15.5)
WBC: 7.4 10*3/uL (ref 4.0–10.5)

## 2013-11-26 LAB — PROTIME-INR
INR: 2.54 — AB (ref 0.00–1.49)
Prothrombin Time: 27.6 seconds — ABNORMAL HIGH (ref 11.6–15.2)

## 2013-11-26 MED ORDER — OXYMETAZOLINE HCL 0.05 % NA SOLN
1.0000 | Freq: Once | NASAL | Status: AC
  Administered 2013-11-26: 1 via NASAL
  Filled 2013-11-26: qty 15

## 2013-11-26 NOTE — ED Provider Notes (Signed)
CSN: [id number]     Arrival date & time 11/25/13  2231 History   First MD Initiated Contact with Patient 11/26/13 0009     Chief Complaint  Patient presents with  . Epistaxis    HPI Pt presents with left sided epistaxis since 4 pm today unrelieved with pressure, ice and afrin at home. Bleeding has slowed at this point but continues to have some faint bleeding. On coumadin for hx of paroxysmal afib. No sob or weakness. No other complaints. Symptoms are mild.   Past Medical History  Diagnosis Date  . Hypertension   . Dysrhythmia     AF  . Pacemaker   . Hyperlipemia   . Arthritis   . Wears glasses   . Wears hearing aid   . Sleep apnea     does use his cpap  . Pacemaker   . Atrial fibrillation    Past Surgical History  Procedure Laterality Date  . Tonsillectomy    . Thumb arthroscopy  2007    rt thunb mass  . Shoulder arthroscopy  2004    left  . Knee arthroscopy  2/10    left  . Pacemaker insertion  2009  . Cardioversion      multiple times-pacer put in 2009  . Colonoscopy    . Mass excision  01/29/2012    Procedure: EXCISION MASS;  Surgeon: Hessie Dibble, MD;  Location: [place];  Service: Orthopedics;  Laterality: Left;  excision of bone spur left hand  . Metacarpal osteotomy Left 01/13/2013    Procedure: LEFT INDEX FINGER MCP RADIAL COLLATERAL REPAIR/RECONSTRUCTION VS METACARPAL ROTATIONAL OSTEOTOMY;  Surgeon: Jolyn Nap, MD;  Location: [place];  Service: Orthopedics;  Laterality: Left;  . I&d extremity Left 04/20/2013    Procedure: LEFT HAND WOUND DEBRIDEMENT AND CLOSURE ;  Surgeon: Jolyn Nap, MD;  Location: [place];  Service: Orthopedics;  Laterality: Left;   No family history on file. History  Substance Use Topics  . Smoking status: Never Smoker   . Smokeless tobacco: Never Used  . Alcohol Use: Yes     Comment: occ    Review of Systems  All other systems reviewed and are  negative.     Allergies  Lipitor; Penicillins; and Vytorin  Home Medications   Prior to Admission medications   Medication Sig Start Date End Date Taking? Authorizing Provider  amLODipine-olmesartan (AZOR) 10-40 MG per tablet Take 1 tablet by mouth daily. 08/04/13  Yes Mihai Croitoru, MD  Arginine 1000 MG TABS Take 1 tablet by mouth daily.    Yes Historical Provider, MD  aspirin 81 MG tablet Take 81 mg by mouth daily.   Yes Historical Provider, MD  celecoxib (CELEBREX) 200 MG capsule Take 200 mg by mouth daily.  01/09/13  Yes Historical Provider, MD  Cholecalciferol (VITAMIN D3) 5000 UNITS TABS Take by mouth daily.   Yes Historical Provider, MD  Cyanocobalamin (VITAMIN B-12 PO) Take 2,500 mcg by mouth daily.   Yes Historical Provider, MD  ezetimibe (ZETIA) 10 MG tablet Take 1 tablet (10 mg total) by mouth daily. 08/04/13  Yes Mihai Croitoru, MD  fish oil-omega-3 fatty acids 1000 MG capsule Take 2 g by mouth daily.   Yes Historical Provider, MD  glucosamine-chondroitin 500-400 MG tablet Take 1 tablet by mouth 3 (three) times daily.   Yes Historical Provider, MD  nebivolol (BYSTOLIC) 10 MG tablet Take 1 tablet (10 mg total) by mouth daily. 08/04/13  Yes Mihai Croitoru, MD  niacin (SLO-NIACIN) 500 MG tablet Take 500 mg by mouth 2 (two) times daily with a meal.   Yes Historical Provider, MD  potassium chloride SA (K-DUR,KLOR-CON) 20 MEQ tablet TAKE ONE TABLET BY MOUTH EVERY DAY Patient taking differently: Take 1 tablet on monday, wednesday, friday 03/09/13  Yes Mihai Croitoru, MD  testosterone (ANDROGEL) 50 MG/5GM GEL Place 5 g onto the skin daily.   Yes Historical Provider, MD  warfarin (COUMADIN) 2 MG tablet Take 2 mg by mouth daily. Tues,thurs,sat   Yes Historical Provider, MD  warfarin (COUMADIN) 4 MG tablet Take 4 mg by mouth daily. Nancy Fetter, mon, wed,fri   Yes Historical Provider, MD  ertapenem 1 g in sodium chloride 0.9 % 50 mL Inject 1 g into the vein daily. 08/21/13   Thayer Headings, MD   furosemide (LASIX) 20 MG tablet 20 mg as needed.  05/02/12   Historical Provider, MD   BP 147/89 mmHg  Pulse 71  Temp(Src) 97.8 F (36.6 C) (Oral)  Resp 16  Ht 5\' 10"  (1.778 m)  Wt 190 lb (86.183 kg)  BMI 27.26 kg/m2  SpO2 98% Physical Exam  Constitutional: He is oriented to person, place, and time. He appears well-developed and well-nourished.  HENT:  Left nare with stigmata of recent bleed and small anterior blood clot present. Still with some bleeding anteriorly. No posterior bleeding noted  Eyes: EOM are normal.  Neck: Normal range of motion.  Pulmonary/Chest: Effort normal.  Abdominal: He exhibits no distension.  Musculoskeletal: Normal range of motion.  Neurological: He is alert and oriented to person, place, and time.  Skin: Skin is warm and dry.  Psychiatric: He has a normal mood and affect.  Nursing note and vitals reviewed.   ED Course  Procedures (including critical care time) Labs Review Labs Reviewed  PROTIME-INR - Abnormal; Notable for the following:    Prothrombin Time 27.6 (*)    INR 2.54 (*)    All other components within normal limits  CBC    Imaging Review No results found.   EKG Interpretation None      MDM   Final diagnoses:  None    Bleeding controlled with afrin and ice. Return precautions given. Will ask pt to hold one dose of his coumadin. Understands to return to ER for new or worsening symptoms  Hoy Morn, MD 11/26/13 [phone]

## 2013-11-26 NOTE — Discharge Instructions (Signed)
Please hold your next dose of coumadin  Nosebleed Nosebleeds can be caused by many conditions, including trauma, infections, polyps, foreign bodies, dry mucous membranes or climate, medicines, and air conditioning. Most nosebleeds occur in the front of the nose. Because of this location, most nosebleeds can be controlled by pinching the nostrils gently and continuously for at least 10 to 20 minutes. The long, continuous pressure allows enough time for the blood to clot. If pressure is released during that 10 to 20 minute time period, the process may have to be started again. The nosebleed may stop by itself or quit with pressure, or it may need concentrated heating (cautery) or pressure from packing. HOME CARE INSTRUCTIONS   If your nose was packed, try to maintain the pack inside until your health care provider removes it. If a gauze pack was used and it starts to fall out, gently replace it or cut the end off. Do not cut if a balloon catheter was used to pack the nose. Otherwise, do not remove unless instructed.  Avoid blowing your nose for 12 hours after treatment. This could dislodge the pack or clot and start the bleeding again.  If the bleeding starts again, sit up and bend forward, gently pinching the front half of your nose continuously for 20 minutes.  If bleeding was caused by dry mucous membranes, use over-the-counter saline nasal spray or gel. This will keep the mucous membranes moist and allow them to heal. If you must use a lubricant, choose the water-soluble variety. Use it only sparingly and not within several hours of lying down.  Do not use petroleum jelly or mineral oil, as these may drip into the lungs and cause serious problems.  Maintain humidity in your home by using less air conditioning or by using a humidifier.  Do not use aspirin or medicines which make bleeding more likely. Your health care provider can give you recommendations on this.  Resume normal activities as you  are able, but try to avoid straining, lifting, or bending at the waist for several days.  If the nosebleeds become recurrent and the cause is unknown, your health care provider may suggest laboratory tests. SEEK MEDICAL CARE IF: You have a fever. SEEK IMMEDIATE MEDICAL CARE IF:   Bleeding recurs and cannot be controlled.  There is unusual bleeding from or bruising on other parts of the body.  Nosebleeds continue.  There is any worsening of the condition which originally brought you in.  You become light-headed, feel faint, become sweaty, or vomit blood. MAKE SURE YOU:   Understand these instructions.  Will watch your condition.  Will get help right away if you are not doing well or get worse. Document Released: 10/04/2004 Document Revised: 05/11/2013 Document Reviewed: 11/25/2008 [facility] Patient Information 2015 [place], [place]. This information is not intended to replace advice given to you by your health care provider. Make sure you discuss any questions you have with your health care provider.

## 2013-11-27 ENCOUNTER — Encounter: Payer: Self-pay | Admitting: Cardiology

## 2013-11-27 DIAGNOSIS — M1711 Unilateral primary osteoarthritis, right knee: Secondary | ICD-10-CM | POA: Diagnosis not present

## 2013-12-02 ENCOUNTER — Encounter: Payer: Self-pay | Admitting: Cardiovascular Disease

## 2013-12-16 DIAGNOSIS — M24542 Contracture, left hand: Secondary | ICD-10-CM | POA: Diagnosis not present

## 2013-12-24 ENCOUNTER — Telehealth: Payer: Self-pay | Admitting: Cardiovascular Disease

## 2013-12-24 NOTE — Telephone Encounter (Signed)
Pt called in concerned about when he was to come back in for a Phy pacer check. He thought that it was every 6 months. He would like to speak to Dr. Loletha Grayer for some clarity. Please call  Thanks

## 2013-12-24 NOTE — Telephone Encounter (Signed)
Spoke with patient - he had a remote pacer check in October and is due for another in February. Dr. Loletha Grayer said he is OK for an in person OV q11months. Patient voiced understanding of this. He agrees with plan. He will see PCP in January. He is aware he is welcome to make an office visit with Dr. Loletha Grayer sooner should he need to.

## 2013-12-30 DIAGNOSIS — Z7901 Long term (current) use of anticoagulants: Secondary | ICD-10-CM | POA: Diagnosis not present

## 2013-12-30 DIAGNOSIS — I480 Paroxysmal atrial fibrillation: Secondary | ICD-10-CM | POA: Diagnosis not present

## 2014-01-04 ENCOUNTER — Institutional Professional Consult (permissible substitution): Payer: Medicare Other | Admitting: Internal Medicine

## 2014-01-04 DIAGNOSIS — J209 Acute bronchitis, unspecified: Secondary | ICD-10-CM | POA: Diagnosis not present

## 2014-01-04 DIAGNOSIS — Z6831 Body mass index (BMI) 31.0-31.9, adult: Secondary | ICD-10-CM | POA: Diagnosis not present

## 2014-01-20 DIAGNOSIS — D099 Carcinoma in situ, unspecified: Secondary | ICD-10-CM | POA: Diagnosis not present

## 2014-01-20 DIAGNOSIS — Z85828 Personal history of other malignant neoplasm of skin: Secondary | ICD-10-CM | POA: Diagnosis not present

## 2014-01-20 DIAGNOSIS — L821 Other seborrheic keratosis: Secondary | ICD-10-CM | POA: Diagnosis not present

## 2014-01-20 DIAGNOSIS — D489 Neoplasm of uncertain behavior, unspecified: Secondary | ICD-10-CM | POA: Diagnosis not present

## 2014-01-20 DIAGNOSIS — L719 Rosacea, unspecified: Secondary | ICD-10-CM | POA: Diagnosis not present

## 2014-01-21 DIAGNOSIS — I83813 Varicose veins of bilateral lower extremities with pain: Secondary | ICD-10-CM | POA: Diagnosis not present

## 2014-01-21 DIAGNOSIS — I87323 Chronic venous hypertension (idiopathic) with inflammation of bilateral lower extremity: Secondary | ICD-10-CM | POA: Diagnosis not present

## 2014-01-22 DIAGNOSIS — Z961 Presence of intraocular lens: Secondary | ICD-10-CM | POA: Diagnosis not present

## 2014-01-27 DIAGNOSIS — I480 Paroxysmal atrial fibrillation: Secondary | ICD-10-CM | POA: Diagnosis not present

## 2014-01-27 DIAGNOSIS — Z7901 Long term (current) use of anticoagulants: Secondary | ICD-10-CM | POA: Diagnosis not present

## 2014-02-04 ENCOUNTER — Encounter: Payer: Self-pay | Admitting: Internal Medicine

## 2014-02-04 ENCOUNTER — Ambulatory Visit (INDEPENDENT_AMBULATORY_CARE_PROVIDER_SITE_OTHER): Payer: Medicare Other | Admitting: Internal Medicine

## 2014-02-04 ENCOUNTER — Encounter (INDEPENDENT_AMBULATORY_CARE_PROVIDER_SITE_OTHER): Payer: Self-pay

## 2014-02-04 VITALS — BP 118/76 | HR 88 | Ht 70.0 in | Wt 207.0 lb

## 2014-02-04 DIAGNOSIS — G4733 Obstructive sleep apnea (adult) (pediatric): Secondary | ICD-10-CM

## 2014-02-04 DIAGNOSIS — J209 Acute bronchitis, unspecified: Secondary | ICD-10-CM | POA: Insufficient documentation

## 2014-02-04 DIAGNOSIS — J208 Acute bronchitis due to other specified organisms: Secondary | ICD-10-CM

## 2014-02-04 NOTE — Assessment & Plan Note (Signed)
History is consistent with very slow resolution of a post viral bronchitis with associated upper respiratory infection. I don't think he needs antibiotic currently. Plan-steroid inhaler for chest-sample, continue Neti pot for nasopharynx

## 2014-02-04 NOTE — Assessment & Plan Note (Signed)
He reports excellent compliance and control with CPAP. The current pressure is appropriate to his machine has worn out and he needs replacement. We will reorder to update with his homecare company and request download for pressure compliance documentation.

## 2014-02-04 NOTE — Progress Notes (Signed)
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 NPSG- 05/04/03- severe obstructive sleep apnea/hypopnea syndrome, AHI 49.7 per hour with body weight 188 pounds His machine is worn out. He has been using it all night every night including with travel, set at 8/[facility] with nasal pillows mask. He sleeps well and wife tells him he does not snore. No lung disease. Chronic atrial fibrillation. History of septoplasty in 1964 leading to nasal septal perforation. Second problem: He caught a cold from his wife in mid December and is left with residual cough, clear thick mucus, lingering  Prior to Admission medications   Medication Sig Start Date End Date Taking? Authorizing Provider  amLODipine-olmesartan (AZOR) 10-40 MG per tablet Take 1 tablet by mouth daily. 08/04/13  Yes Mihai Croitoru, MD  Arginine 1000 MG TABS Take 1 tablet by mouth daily.    Yes Historical Provider, MD  aspirin 81 MG tablet Take 81 mg by mouth daily.   Yes Historical Provider, MD  celecoxib (CELEBREX) 200 MG capsule Take 200 mg by mouth daily.  01/09/13  Yes Historical Provider, MD  Cholecalciferol (VITAMIN D3) 5000 UNITS TABS Take by mouth daily.   Yes Historical Provider, MD  Cyanocobalamin (VITAMIN B-12 PO) Take 2,500 mcg by mouth daily.   Yes Historical Provider, MD  ezetimibe (ZETIA) 10 MG tablet Take 1 tablet (10 mg total) by mouth daily. 08/04/13  Yes Mihai Croitoru, MD  fish oil-omega-3 fatty acids 1000 MG capsule Take 2 g by mouth daily.   Yes Historical Provider, MD  glucosamine-chondroitin 500-400 MG tablet Take 1 tablet by mouth 3 (three) times daily.   Yes Historical Provider, MD  nebivolol (BYSTOLIC) 10 MG tablet Take 1 tablet (10 mg total) by mouth daily. 08/04/13  Yes Mihai Croitoru, MD  niacin (SLO-NIACIN) 500 MG tablet Take 500 mg by  mouth 2 (two) times daily with a meal.   Yes Historical Provider, MD  potassium chloride SA (K-DUR,KLOR-CON) 20 MEQ tablet TAKE ONE TABLET BY MOUTH EVERY DAY 03/09/13  Yes Mihai Croitoru, MD  testosterone (ANDROGEL) 50 MG/5GM GEL Place 5 g onto the skin daily.   Yes Historical Provider, MD  warfarin (COUMADIN) 2 MG tablet Take 2 mg by mouth daily. Tues,thurs,sat   Yes Historical Provider, MD  warfarin (COUMADIN) 4 MG tablet Take 4 mg by mouth daily. Sun, mon, wed,fri   Yes Historical Provider, MD  hydrochlorothiazide (MICROZIDE) 12.5 MG capsule Take 1 capsule by mouth daily. 01/25/14   Historical Provider, MD   Past Medical History  Diagnosis Date  . Hypertension   . Dysrhythmia     AF  . Pacemaker   . Hyperlipemia   . Arthritis   . Wears glasses   . Wears hearing aid   . Sleep apnea     does use his cpap  . Pacemaker   . Atrial fibrillation    Past Surgical History  Procedure Laterality Date  . Tonsillectomy    . Thumb arthroscopy  2007    rt thunb mass  . Shoulder arthroscopy  2004    left  . Knee arthroscopy  2/10    left  . Pacemaker insertion  2009  . Cardioversion      multiple times-pacer put in 2009  . Colonoscopy    . Mass excision  01/29/2012    Procedure:  EXCISION MASS;  Surgeon: Hessie Dibble, MD;  Location: [place];  Service: Orthopedics;  Laterality: Left;  excision of bone spur left hand  . Metacarpal osteotomy Left 01/13/2013    Procedure: LEFT INDEX FINGER MCP RADIAL COLLATERAL REPAIR/RECONSTRUCTION VS METACARPAL ROTATIONAL OSTEOTOMY;  Surgeon: Jolyn Nap, MD;  Location: [place];  Service: Orthopedics;  Laterality: Left;  . I&d extremity Left 04/20/2013    Procedure: LEFT HAND WOUND DEBRIDEMENT AND CLOSURE ;  Surgeon: Jolyn Nap, MD;  Location: [place];  Service: Orthopedics;  Laterality: Left;   Family History  Problem Relation Age of Onset  . Emphysema    . Heart disease     History    Social History  . Marital Status: Married    Spouse Name: N/A    Number of Children: N/A  . Years of Education: N/A   Occupational History  . [name]    Social History Main Topics  . Smoking status: Never Smoker   . Smokeless tobacco: Never Used  . Alcohol Use: 0.0 oz/week    0 Not specified per week     Comment: wine 3-4 times a week  . Drug Use: No  . Sexual Activity: Not on file   Other Topics Concern  . Not on file   Social History Narrative   ROS-see HPI Constitutional:   No-   weight loss, night sweats, fevers, chills, fatigue, lassitude. HEENT:   No-  headaches, difficulty swallowing, tooth/dental problems, sore throat,       No-  sneezing, itching, ear ache, +nasal congestion, post nasal drip,  CV:  No-   chest pain, orthopnea, PND, swelling in lower extremities, anasarca,                                  dizziness, palpitations Resp: No-   shortness of breath with exertion or at rest.             + productive cough,  No non-productive cough,  No- coughing up of blood.              No-   change in color of mucus.  No- wheezing.   Skin: No-   rash or lesions. GI:  No-   heartburn, indigestion, abdominal pain, nausea, vomiting, diarrhea,                 change in bowel habits, loss of appetite GU: No-   dysuria, change in color of urine, no urgency or frequency.  No- flank pain. MS:  No-   joint pain or swelling.  No- decreased range of motion.  No- back pain. Neuro-     nothing unusual Psych:  No- change in mood or affect. No depression or anxiety.  No memory loss.  OBJ- Physical Exam    Medium build General- Alert, Oriented, Affect-appropriate, Distress- none acute Skin- rash-none, lesions- none, excoriation- none Lymphadenopathy- none Head- atraumatic            Eyes- Gross vision intact, PERRLA, conjunctivae and secretions clear            Ears- Hearing, canals-normal            Nose- Clear, no-Septal dev, mucus, polyps, erosion, + perforation              Throat- Mallampati II-III , mucosa clear , drainage- none, tonsils- atrophic Neck- flexible ,  trachea midline, no stridor , thyroid nl, carotid no bruit Chest - symmetrical excursion , unlabored           Heart/CV- RRR-feels regular , no murmur , no gallop  , no rub, nl s1 s2                           - JVD- none , edema- none, stasis changes- none, varices- none           Lung- clear to P&A, wheeze- none, cough- none , dullness-none, rub- none           Chest wall- +pacemaker L Abd- tender-no, distended-no, bowel sounds-present, HSM- no Br/ Gen/ Rectal- Not done, not indicated Extrem- cyanosis- none, clubbing, none, atrophy- none, strength- nl Neuro- grossly intact to observation

## 2014-02-04 NOTE — Patient Instructions (Addendum)
Order- [facility]- replacement CPAP machine 8 cwp, mask of choice, supplies, humidifier    Dx OSA  For the cough- Try using your Benzonatate perles 200 mg, every 8 hours for cough if needed  Sample Breo 100 inhaler    1 puff, then rinse mouth once daily Try this to see if it speeds clearing of the cough

## 2014-02-09 ENCOUNTER — Ambulatory Visit (INDEPENDENT_AMBULATORY_CARE_PROVIDER_SITE_OTHER): Payer: Medicare Other | Admitting: *Deleted

## 2014-02-09 DIAGNOSIS — I442 Atrioventricular block, complete: Secondary | ICD-10-CM

## 2014-02-09 NOTE — Progress Notes (Signed)
Remote pacemaker transmission.   

## 2014-02-11 LAB — MDC_IDC_ENUM_SESS_TYPE_REMOTE
Battery Impedance: 2900 Ohm
Battery Remaining Longevity: 22 mo
Battery Voltage: 2.71 V
Brady Statistic RV Percent Paced: 100 %
Date Time Interrogation Session: 20160202152610
Lead Channel Impedance Value: 0 Ohm
Lead Channel Impedance Value: 472 Ohm
Lead Channel Pacing Threshold Amplitude: 0.75 V
Lead Channel Pacing Threshold Pulse Width: 0.4 ms
Lead Channel Setting Sensing Sensitivity: 5.6 mV
MDC IDC SET LEADCHNL RV PACING AMPLITUDE: 2.5 V
MDC IDC SET LEADCHNL RV PACING PULSEWIDTH: 0.4 ms

## 2014-02-17 DIAGNOSIS — Z0000 Encounter for general adult medical examination without abnormal findings: Secondary | ICD-10-CM | POA: Diagnosis not present

## 2014-02-17 DIAGNOSIS — Z125 Encounter for screening for malignant neoplasm of prostate: Secondary | ICD-10-CM | POA: Diagnosis not present

## 2014-02-17 DIAGNOSIS — I10 Essential (primary) hypertension: Secondary | ICD-10-CM | POA: Diagnosis not present

## 2014-02-17 DIAGNOSIS — E785 Hyperlipidemia, unspecified: Secondary | ICD-10-CM | POA: Diagnosis not present

## 2014-02-22 ENCOUNTER — Encounter: Payer: Self-pay | Admitting: Cardiology

## 2014-02-24 DIAGNOSIS — I10 Essential (primary) hypertension: Secondary | ICD-10-CM | POA: Diagnosis not present

## 2014-02-24 DIAGNOSIS — E291 Testicular hypofunction: Secondary | ICD-10-CM | POA: Diagnosis not present

## 2014-02-24 DIAGNOSIS — E669 Obesity, unspecified: Secondary | ICD-10-CM | POA: Diagnosis not present

## 2014-02-24 DIAGNOSIS — Z0000 Encounter for general adult medical examination without abnormal findings: Secondary | ICD-10-CM | POA: Diagnosis not present

## 2014-02-24 DIAGNOSIS — J069 Acute upper respiratory infection, unspecified: Secondary | ICD-10-CM | POA: Diagnosis not present

## 2014-02-24 DIAGNOSIS — E785 Hyperlipidemia, unspecified: Secondary | ICD-10-CM | POA: Diagnosis not present

## 2014-02-24 DIAGNOSIS — G3184 Mild cognitive impairment, so stated: Secondary | ICD-10-CM | POA: Diagnosis not present

## 2014-02-24 DIAGNOSIS — Z1389 Encounter for screening for other disorder: Secondary | ICD-10-CM | POA: Diagnosis not present

## 2014-02-24 DIAGNOSIS — Z6831 Body mass index (BMI) 31.0-31.9, adult: Secondary | ICD-10-CM | POA: Diagnosis not present

## 2014-02-24 DIAGNOSIS — I480 Paroxysmal atrial fibrillation: Secondary | ICD-10-CM | POA: Diagnosis not present

## 2014-02-24 DIAGNOSIS — Z7901 Long term (current) use of anticoagulants: Secondary | ICD-10-CM | POA: Diagnosis not present

## 2014-02-25 DIAGNOSIS — Z1212 Encounter for screening for malignant neoplasm of rectum: Secondary | ICD-10-CM | POA: Diagnosis not present

## 2014-02-26 ENCOUNTER — Encounter: Payer: Self-pay | Admitting: Cardiovascular Disease

## 2014-03-15 ENCOUNTER — Other Ambulatory Visit: Payer: Self-pay | Admitting: Cardiovascular Disease

## 2014-03-17 DIAGNOSIS — I480 Paroxysmal atrial fibrillation: Secondary | ICD-10-CM | POA: Diagnosis not present

## 2014-03-17 DIAGNOSIS — Z7901 Long term (current) use of anticoagulants: Secondary | ICD-10-CM | POA: Diagnosis not present

## 2014-03-30 ENCOUNTER — Other Ambulatory Visit: Payer: Self-pay | Admitting: Cardiovascular Disease

## 2014-03-30 NOTE — Telephone Encounter (Signed)
Rx refill sent to patient pharmacy   

## 2014-04-02 ENCOUNTER — Telehealth: Payer: Self-pay | Admitting: Cardiovascular Disease

## 2014-04-02 NOTE — Telephone Encounter (Signed)
Dean Morgan called in wanting to know the correct strength of the medication Azor. She states that the prescription was written for 5-40mg  but the pt states that he normally takes 10-40mg . Please f/u with the pharmacy  Thanks

## 2014-04-02 NOTE — Telephone Encounter (Addendum)
Rx historically written for Azor 10-40.... Did not find indication we have changed. Pt has been taking 10-40 w/o problems.  They already filled for 5-40 & asking if pt can have 5mg  amlodipine additionally daily to meet dosing equivalence.  Advised OK.   I do note he had been cut down to 5-40 in the past d/t probs w/ nasal congestion, but this didn't seem to be a longstanding concern.

## 2014-04-05 DIAGNOSIS — Z6831 Body mass index (BMI) 31.0-31.9, adult: Secondary | ICD-10-CM | POA: Diagnosis not present

## 2014-04-05 DIAGNOSIS — I480 Paroxysmal atrial fibrillation: Secondary | ICD-10-CM | POA: Diagnosis not present

## 2014-04-05 DIAGNOSIS — J209 Acute bronchitis, unspecified: Secondary | ICD-10-CM | POA: Diagnosis not present

## 2014-04-05 DIAGNOSIS — R05 Cough: Secondary | ICD-10-CM | POA: Diagnosis not present

## 2014-04-05 DIAGNOSIS — Z7901 Long term (current) use of anticoagulants: Secondary | ICD-10-CM | POA: Diagnosis not present

## 2014-04-19 DIAGNOSIS — Z7901 Long term (current) use of anticoagulants: Secondary | ICD-10-CM | POA: Diagnosis not present

## 2014-04-19 DIAGNOSIS — Z8679 Personal history of other diseases of the circulatory system: Secondary | ICD-10-CM | POA: Diagnosis not present

## 2014-04-19 DIAGNOSIS — Z86010 Personal history of colonic polyps: Secondary | ICD-10-CM | POA: Diagnosis not present

## 2014-04-20 DIAGNOSIS — Z7901 Long term (current) use of anticoagulants: Secondary | ICD-10-CM | POA: Diagnosis not present

## 2014-04-20 DIAGNOSIS — I480 Paroxysmal atrial fibrillation: Secondary | ICD-10-CM | POA: Diagnosis not present

## 2014-04-23 ENCOUNTER — Telehealth: Payer: Self-pay | Admitting: *Deleted

## 2014-04-23 NOTE — Telephone Encounter (Signed)
Authorization to hold Coumadin 5 days prior to colonoscopy and restart immediatley if low bleeding risk based on findings faxed to [place] (Dr. Oletta Lamas).

## 2014-04-27 DIAGNOSIS — K5730 Diverticulosis of large intestine without perforation or abscess without bleeding: Secondary | ICD-10-CM | POA: Diagnosis not present

## 2014-04-27 DIAGNOSIS — Z09 Encounter for follow-up examination after completed treatment for conditions other than malignant neoplasm: Secondary | ICD-10-CM | POA: Diagnosis not present

## 2014-04-27 DIAGNOSIS — K641 Second degree hemorrhoids: Secondary | ICD-10-CM | POA: Diagnosis not present

## 2014-04-27 DIAGNOSIS — Z86010 Personal history of colonic polyps: Secondary | ICD-10-CM | POA: Diagnosis not present

## 2014-05-10 ENCOUNTER — Other Ambulatory Visit: Payer: Self-pay | Admitting: Dermatology

## 2014-05-10 DIAGNOSIS — L820 Inflamed seborrheic keratosis: Secondary | ICD-10-CM | POA: Diagnosis not present

## 2014-05-10 DIAGNOSIS — L821 Other seborrheic keratosis: Secondary | ICD-10-CM | POA: Diagnosis not present

## 2014-05-10 DIAGNOSIS — D485 Neoplasm of uncertain behavior of skin: Secondary | ICD-10-CM | POA: Diagnosis not present

## 2014-05-11 ENCOUNTER — Encounter: Payer: Self-pay | Admitting: *Deleted

## 2014-05-11 ENCOUNTER — Ambulatory Visit (INDEPENDENT_AMBULATORY_CARE_PROVIDER_SITE_OTHER): Payer: Medicare Other | Admitting: *Deleted

## 2014-05-11 DIAGNOSIS — I442 Atrioventricular block, complete: Secondary | ICD-10-CM

## 2014-05-11 NOTE — Progress Notes (Signed)
Remote pacemaker transmission.   

## 2014-05-14 LAB — CUP PACEART REMOTE DEVICE CHECK
Battery Impedance: 3013 Ohm
Battery Voltage: 2.7 V
Date Time Interrogation Session: 20160503121006
Lead Channel Pacing Threshold Pulse Width: 0.4 ms
MDC IDC MSMT BATTERY REMAINING LONGEVITY: 21 mo
MDC IDC MSMT LEADCHNL RA IMPEDANCE VALUE: 0 Ohm
MDC IDC MSMT LEADCHNL RV IMPEDANCE VALUE: 433 Ohm
MDC IDC MSMT LEADCHNL RV PACING THRESHOLD AMPLITUDE: 0.75 V
MDC IDC SET LEADCHNL RV PACING AMPLITUDE: 2.5 V
MDC IDC SET LEADCHNL RV PACING PULSEWIDTH: 0.4 ms
MDC IDC SET LEADCHNL RV SENSING SENSITIVITY: 5.6 mV
MDC IDC STAT BRADY RV PERCENT PACED: 100 %

## 2014-05-18 ENCOUNTER — Encounter: Payer: Self-pay | Admitting: Cardiology

## 2014-05-18 DIAGNOSIS — I480 Paroxysmal atrial fibrillation: Secondary | ICD-10-CM | POA: Diagnosis not present

## 2014-05-18 DIAGNOSIS — Z7901 Long term (current) use of anticoagulants: Secondary | ICD-10-CM | POA: Diagnosis not present

## 2014-05-19 ENCOUNTER — Encounter: Payer: Self-pay | Admitting: *Deleted

## 2014-05-20 ENCOUNTER — Encounter: Payer: Self-pay | Admitting: Cardiovascular Disease

## 2014-06-17 ENCOUNTER — Telehealth: Payer: Self-pay | Admitting: Cardiovascular Disease

## 2014-06-17 ENCOUNTER — Other Ambulatory Visit: Payer: Self-pay | Admitting: Cardiovascular Disease

## 2014-06-17 DIAGNOSIS — Z7901 Long term (current) use of anticoagulants: Secondary | ICD-10-CM | POA: Diagnosis not present

## 2014-06-17 DIAGNOSIS — I480 Paroxysmal atrial fibrillation: Secondary | ICD-10-CM | POA: Diagnosis not present

## 2014-06-17 MED ORDER — AMLODIPINE-OLMESARTAN 10-40 MG PO TABS: 1.0000 | ORAL_TABLET | Freq: Every day | ORAL | Status: DC

## 2014-06-17 NOTE — Telephone Encounter (Signed)
Rx(s) sent to pharmacy electronically.  

## 2014-06-17 NOTE — Telephone Encounter (Signed)
Rachel Moulds called in stating that the wrong strength was called in for the pt's Azor prescription. She says that the 5/40mg  was called in instead of the 10/40. She just wanted some clarity on which the doctor want refilled. Please call  Thanks

## 2014-06-17 NOTE — Telephone Encounter (Signed)
SPOKE TO Emily 10/40  DAILY. VERBAL ORDER GIVEN.

## 2014-07-14 DIAGNOSIS — I480 Paroxysmal atrial fibrillation: Secondary | ICD-10-CM | POA: Diagnosis not present

## 2014-07-14 DIAGNOSIS — Z7901 Long term (current) use of anticoagulants: Secondary | ICD-10-CM | POA: Diagnosis not present

## 2014-08-10 ENCOUNTER — Ambulatory Visit (INDEPENDENT_AMBULATORY_CARE_PROVIDER_SITE_OTHER): Payer: Medicare Other | Admitting: Cardiovascular Disease

## 2014-08-10 ENCOUNTER — Encounter: Payer: Self-pay | Admitting: Cardiovascular Disease

## 2014-08-10 VITALS — BP 140/87 | HR 85 | Resp 16 | Ht 70.0 in | Wt 193.0 lb

## 2014-08-10 DIAGNOSIS — I482 Chronic atrial fibrillation: Secondary | ICD-10-CM

## 2014-08-10 DIAGNOSIS — I10 Essential (primary) hypertension: Secondary | ICD-10-CM

## 2014-08-10 DIAGNOSIS — I442 Atrioventricular block, complete: Secondary | ICD-10-CM

## 2014-08-10 DIAGNOSIS — I4821 Permanent atrial fibrillation: Secondary | ICD-10-CM

## 2014-08-10 LAB — CUP PACEART INCLINIC DEVICE CHECK
Battery Impedance: 3498 Ohm
Battery Remaining Longevity: 16 mo
Battery Voltage: 2.7 V
Brady Statistic RV Percent Paced: 100 %
Date Time Interrogation Session: 20160802114008
Lead Channel Impedance Value: 0 Ohm
Lead Channel Pacing Threshold Amplitude: 0.75 V
Lead Channel Pacing Threshold Pulse Width: 0.4 ms
Lead Channel Setting Pacing Amplitude: 2.5 V
Lead Channel Setting Pacing Pulse Width: 0.4 ms
Lead Channel Setting Sensing Sensitivity: 4 mV
MDC IDC MSMT LEADCHNL RV IMPEDANCE VALUE: 431 Ohm

## 2014-08-10 NOTE — Patient Instructions (Signed)
Your physician wants you to follow-up in: 1 Year. You will receive a reminder letter in the mail two months in advance. If you don't receive a letter, please call our office to schedule the follow-up appointment.  Remote monitoring is used to monitor your Pacemaker of ICD from home. This monitoring reduces the number of office visits required to check your device to one time per year. It allows [place] to keep an eye on the functioning of your device to ensure it is working properly. You are scheduled for a device check from home on 11/10/14. You may send your transmission at any time that day. If you have a wireless device, the transmission will be sent automatically. After your physician reviews your transmission, you will receive a postcard with your next transmission date.

## 2014-08-10 NOTE — Progress Notes (Signed)
Patient ID: Dean Morgan, [name], male   DOB: 12/27/1931, 81 y.o.   MRN: [id number]     Cardiology Office Note   Date:  08/10/2014   ID:  Dean Morgan, [name], DOB January 11, 1931, MRN [id number]  PCP:  Marton Redwood, MD  Cardiologist:   Sanda Klein, MD   Chief Complaint  Patient presents with  . Annual Exam    Patient has no complaints.      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 Quay Morgan is doing well and has no cardiac complaints. He has no trouble with dyspnea, fatigue or angina or intermittent claudication or lower extremity edema. He has not had any focal neurological events or any bleeding complications while on warfarin. Dr. Raul Morgan office monitors his warfarin. Has not had fever or chills.  He does describe some degree of reduction in stamina but he still plays golf on a regular basis. He has noticed this just over last 3 months or so.  Interrogation of his pacemaker shows normal device function with 100% ventricular pacing. Very rare escape beats are seen when pacing is taken down to 30 bpm. The heart rate histogram does show adequate distribution of heart rates with roughly 70% of heartbeats at the lower rate limit of 60 bpm , but with heart rates increasing to the upper sensor rate of 130 bpm in an appropriate fashion.  No high rate ventricular episodes have been detected.   battery longevity is estimated to be around 16 months (4-28 months). Lead parameters are excellent: threshold 0.875 V at 0.4 ms, impedance 431 ohms (lead implanted 2009, Medtronic [id number]).    Past Medical History  Diagnosis Date  . Hypertension   . Dysrhythmia     AF  . Pacemaker   . Hyperlipemia   . Arthritis   . Wears glasses   . Wears hearing aid   . Sleep apnea     does use his cpap  . Pacemaker   . Atrial fibrillation   . Sinus bradycardia seen on  cardiac monitor     Past Surgical History  Procedure Laterality Date  . Tonsillectomy    . Thumb arthroscopy  2007    rt thunb mass  . Shoulder arthroscopy  2004    left  . Knee arthroscopy  2/10    left  . Pacemaker insertion  2009  . Cardioversion      multiple times-pacer put in 2009  . Colonoscopy    . Mass excision  01/29/2012    Procedure: EXCISION MASS;  Surgeon: Hessie Dibble, MD;  Location: [facility];  Service: Orthopedics;  Laterality: Left;  excision of bone spur left hand  . Metacarpal osteotomy Left 01/13/2013    Procedure: LEFT INDEX FINGER MCP RADIAL COLLATERAL REPAIR/RECONSTRUCTION VS METACARPAL ROTATIONAL OSTEOTOMY;  Surgeon: Jolyn Nap, MD;  Location: [facility];  Service: Orthopedics;  Laterality: Left;  . I&d extremity Left 04/20/2013    Procedure: LEFT HAND WOUND DEBRIDEMENT AND CLOSURE ;  Surgeon: Jolyn Nap, MD;  Location: [facility];  Service: Orthopedics;  Laterality: Left;     Current Outpatient Prescriptions  Medication Sig Dispense Refill  . amLODipine-olmesartan (AZOR) 10-40 MG per tablet Take 1 tablet by mouth daily. 90 tablet 3  . Arginine 1000 MG TABS Take 1 tablet by mouth daily.     Marland Kitchen  aspirin 81 MG tablet Take 81 mg by mouth daily.    . celecoxib (CELEBREX) 200 MG capsule Take 200 mg by mouth daily.     . Cholecalciferol (VITAMIN D3) 5000 UNITS TABS Take by mouth daily.    . Cyanocobalamin (VITAMIN B-12 PO) Take 2,500 mcg by mouth daily.    Marland Kitchen ezetimibe (ZETIA) 10 MG tablet Take 1 tablet (10 mg total) by mouth daily. 28 tablet 0  . fish oil-omega-3 fatty acids 1000 MG capsule Take 2 g by mouth daily.    Marland Kitchen glucosamine-chondroitin 500-400 MG tablet Take 1 tablet by mouth 3 (three) times daily.    . hydrochlorothiazide (MICROZIDE) 12.5 MG capsule Take 1 capsule by mouth daily.    . nebivolol (BYSTOLIC) 10 MG tablet Take 1 tablet (10 mg total) by mouth daily. 28 tablet 0  . niacin (SLO-NIACIN)  500 MG tablet Take 500 mg by mouth 2 (two) times daily with a meal.    . potassium chloride SA (K-DUR,KLOR-CON) 20 MEQ tablet TAKE ONE TABLET BY MOUTH EVERY DAY 30 tablet 6  . warfarin (COUMADIN) 2 MG tablet Take 2 mg by mouth daily. Tues,thurs,sat    . warfarin (COUMADIN) 4 MG tablet Take 4 mg by mouth daily. Sun, mon, wed,fri     No current facility-administered medications for this visit.    Allergies:   Lipitor; Penicillins; and Vytorin    Social History:  The patient  reports that he has never smoked. He has never used smokeless tobacco. He reports that he drinks alcohol. He reports that he does not use illicit drugs.   Family History:  The patient's family history includes Diabetes in his brother and maternal grandmother; Emphysema in an other family member; Heart disease in an other family member; Hypertension in his brother; Stroke in his father and paternal grandfather.    ROS:  Please see the history of present illness.    Otherwise, review of systems positive for none.   All other systems are reviewed and negative.    PHYSICAL EXAM: VS:  BP 140/87 mmHg  Pulse 85  Resp 16  Ht 5\' 10"  (1.778 m)  Wt 193 lb (87.544 kg)  BMI 27.69 kg/m2 , BMI Body mass index is 27.69 kg/(m^2).  General: Alert, oriented x3, no distress Head: no evidence of trauma, PERRL, EOMI, no exophtalmos or lid lag, no myxedema, no xanthelasma; normal ears, nose and oropharynx Neck: normal jugular venous pulsations and no hepatojugular reflux; brisk carotid pulses without delay and no carotid bruits Chest: clear to auscultation, no signs of consolidation by percussion or palpation, normal fremitus, symmetrical and full respiratory excursions,  Healthy right subclavian pacemaker Cardiovascular: normal position and quality of the apical impulse, regular rhythm, normal first and  Paradoxically splitsecond heart sounds, no  murmurs, rubs or gallops Abdomen: no tenderness or distention, no masses by palpation, no  abnormal pulsatility or arterial bruits, normal bowel sounds, no hepatosplenomegaly Extremities: no clubbing, cyanosis or edema; 2+ radial, ulnar and brachial pulses bilaterally; 2+ right femoral, posterior tibial and dorsalis pedis pulses; 2+ left femoral, posterior tibial and dorsalis pedis pulses; no subclavian or femoral bruits Neurological: grossly nonfocal Psych: euthymic mood, full affect   EKG:  EKG is ordered today. The ekg ordered today demonstrates  Atrial fibrillation with 100% ventricular paced rhythm   Recent Labs: 08/21/2013: ALT 23; BUN 23; Creat 1.09; Potassium 4.4; Sodium 143 11/26/2013: Hemoglobin 14.3; Platelets 155    Lipid Panel    Component Value Date/Time   CHOL  164 06/10/2012 0815   TRIG 94 06/10/2012 0815   HDL 64 06/10/2012 0815   CHOLHDL 2.6 06/10/2012 0815   VLDL 19 06/10/2012 0815   LDLCALC 81 06/10/2012 0815      Wt Readings from Last 3 Encounters:  08/10/14 193 lb (87.544 kg)  02/04/14 207 lb (93.895 kg)  11/25/13 190 lb (86.183 kg)      ASSESSMENT AND PLAN:  Atrial fibrillation, permanent  On appropriate warfarin anticoagulation.  No evidence of bleeding or embolic events.  Complete heart block  Pacemaker dependent. Single-chamber Medtronic Adapta ADD SR 1 implanted April 2009, with normal function by recent [employer]. There are no episodes of rapid ventricular rate. There is no escape rhythm in the device is turned down to 30 beats per minute. The parameters are good and expected longevity is about another 16 months. Device heart rate histograms show adequate distribution. When we approach ERI will increase frequency of home downloads to monthly, for the time being continue every 3 month checks   OBSTRUCTIVE SLEEP APNEA  Reports compliance with CPAP   HTN (hypertension)  Fair control.  Blood pressure checked at home is substantially lower than the borderline high recording in the office today.  HYPERLIPIDEMIA   Intolerant to statins; normal nuclear stress test in August of 2012. On zetia and and niacin therapy: excellent lipid profile.     Current medicines are reviewed at length with the patient today.  The patient does not have concerns regarding medicines.  The following changes have been made:  no change  Labs/ tests ordered today include:  No orders of the defined types were placed in this encounter.     Patient Instructions  Your physician wants you to follow-up in: 1 Year. You will receive a reminder letter in the mail two months in advance. If you don't receive a letter, please call our office to schedule the follow-up appointment.  Remote monitoring is used to monitor your Pacemaker of ICD from home. This monitoring reduces the number of office visits required to check your device to one time per year. It allows [place] to keep an eye on the functioning of your device to ensure it is working properly. You are scheduled for a device check from home on 11/10/14. You may send your transmission at any time that day. If you have a wireless device, the transmission will be sent automatically. After your physician reviews your transmission, you will receive a postcard with your next transmission date.         Mikael Spray, MD  08/10/2014 8:40 AM    Sanda Klein, MD, [facility] [phone] office [phone] pager

## 2014-08-19 DIAGNOSIS — I480 Paroxysmal atrial fibrillation: Secondary | ICD-10-CM | POA: Diagnosis not present

## 2014-08-19 DIAGNOSIS — Z7901 Long term (current) use of anticoagulants: Secondary | ICD-10-CM | POA: Diagnosis not present

## 2014-09-02 ENCOUNTER — Encounter: Payer: Self-pay | Admitting: Cardiovascular Disease

## 2014-09-30 DIAGNOSIS — Z7901 Long term (current) use of anticoagulants: Secondary | ICD-10-CM | POA: Diagnosis not present

## 2014-09-30 DIAGNOSIS — I480 Paroxysmal atrial fibrillation: Secondary | ICD-10-CM | POA: Diagnosis not present

## 2014-11-04 DIAGNOSIS — I480 Paroxysmal atrial fibrillation: Secondary | ICD-10-CM | POA: Diagnosis not present

## 2014-11-04 DIAGNOSIS — Z7901 Long term (current) use of anticoagulants: Secondary | ICD-10-CM | POA: Diagnosis not present

## 2014-11-04 DIAGNOSIS — Z23 Encounter for immunization: Secondary | ICD-10-CM | POA: Diagnosis not present

## 2014-11-09 ENCOUNTER — Ambulatory Visit (INDEPENDENT_AMBULATORY_CARE_PROVIDER_SITE_OTHER): Payer: Medicare Other | Admitting: *Deleted

## 2014-11-09 DIAGNOSIS — I442 Atrioventricular block, complete: Secondary | ICD-10-CM

## 2014-11-10 NOTE — Progress Notes (Signed)
Remote pacemaker transmission.   

## 2014-11-18 LAB — CUP PACEART REMOTE DEVICE CHECK
Battery Voltage: 2.7 V
Implantable Lead Implant Date: 20090402
Implantable Lead Location: 753860
Lead Channel Impedance Value: 0 Ohm
Lead Channel Pacing Threshold Amplitude: 0.875 V
Lead Channel Pacing Threshold Pulse Width: 0.4 ms
Lead Channel Setting Pacing Amplitude: 2.5 V
MDC IDC MSMT BATTERY IMPEDANCE: 3878 Ohm
MDC IDC MSMT BATTERY REMAINING LONGEVITY: 14 mo
MDC IDC MSMT LEADCHNL RV IMPEDANCE VALUE: 458 Ohm
MDC IDC SESS DTM: 20161101124740
MDC IDC SET LEADCHNL RV PACING PULSEWIDTH: 0.4 ms
MDC IDC SET LEADCHNL RV SENSING SENSITIVITY: 4 mV
MDC IDC STAT BRADY RV PERCENT PACED: 100 %

## 2014-11-19 ENCOUNTER — Encounter: Payer: Self-pay | Admitting: Cardiology

## 2014-12-07 DIAGNOSIS — I480 Paroxysmal atrial fibrillation: Secondary | ICD-10-CM | POA: Diagnosis not present

## 2014-12-07 DIAGNOSIS — Z7901 Long term (current) use of anticoagulants: Secondary | ICD-10-CM | POA: Diagnosis not present

## 2014-12-21 DIAGNOSIS — I87393 Chronic venous hypertension (idiopathic) with other complications of bilateral lower extremity: Secondary | ICD-10-CM | POA: Diagnosis not present

## 2015-01-06 DIAGNOSIS — Z7901 Long term (current) use of anticoagulants: Secondary | ICD-10-CM | POA: Diagnosis not present

## 2015-01-06 DIAGNOSIS — I480 Paroxysmal atrial fibrillation: Secondary | ICD-10-CM | POA: Diagnosis not present

## 2015-02-03 DIAGNOSIS — L570 Actinic keratosis: Secondary | ICD-10-CM | POA: Diagnosis not present

## 2015-02-03 DIAGNOSIS — D225 Melanocytic nevi of trunk: Secondary | ICD-10-CM | POA: Diagnosis not present

## 2015-02-03 DIAGNOSIS — H04123 Dry eye syndrome of bilateral lacrimal glands: Secondary | ICD-10-CM | POA: Diagnosis not present

## 2015-02-03 DIAGNOSIS — Z23 Encounter for immunization: Secondary | ICD-10-CM | POA: Diagnosis not present

## 2015-02-03 DIAGNOSIS — L719 Rosacea, unspecified: Secondary | ICD-10-CM | POA: Diagnosis not present

## 2015-02-03 DIAGNOSIS — L821 Other seborrheic keratosis: Secondary | ICD-10-CM | POA: Diagnosis not present

## 2015-02-03 DIAGNOSIS — Z85828 Personal history of other malignant neoplasm of skin: Secondary | ICD-10-CM | POA: Diagnosis not present

## 2015-02-03 DIAGNOSIS — D485 Neoplasm of uncertain behavior of skin: Secondary | ICD-10-CM | POA: Diagnosis not present

## 2015-02-03 DIAGNOSIS — D0462 Carcinoma in situ of skin of left upper limb, including shoulder: Secondary | ICD-10-CM | POA: Diagnosis not present

## 2015-02-03 DIAGNOSIS — Z961 Presence of intraocular lens: Secondary | ICD-10-CM | POA: Diagnosis not present

## 2015-02-07 ENCOUNTER — Ambulatory Visit (INDEPENDENT_AMBULATORY_CARE_PROVIDER_SITE_OTHER): Payer: Medicare Other | Admitting: Internal Medicine

## 2015-02-07 ENCOUNTER — Encounter: Payer: Self-pay | Admitting: Internal Medicine

## 2015-02-07 VITALS — BP 118/78 | HR 96 | Ht 70.0 in | Wt 190.0 lb

## 2015-02-07 DIAGNOSIS — I4821 Permanent atrial fibrillation: Secondary | ICD-10-CM

## 2015-02-07 DIAGNOSIS — G4733 Obstructive sleep apnea (adult) (pediatric): Secondary | ICD-10-CM | POA: Diagnosis not present

## 2015-02-07 DIAGNOSIS — I482 Chronic atrial fibrillation: Secondary | ICD-10-CM

## 2015-02-07 DIAGNOSIS — J300 Vasomotor rhinitis: Secondary | ICD-10-CM

## 2015-02-07 NOTE — Assessment & Plan Note (Signed)
He complains of postnasal drainage but anterior dryness complicated by perforated nasal septum which bleeds intermittently. Plan-use combination of saline nasal gel and trial sample Dymista. nasal spray

## 2015-02-07 NOTE — Assessment & Plan Note (Signed)
He doesn't consider sleeping without CPAP. We need download to verify pressure setting. He has 3 different machines, used for travel and at [facility]. This may cause some gaps in record. Plan-download

## 2015-02-07 NOTE — Progress Notes (Signed)
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 NPSG- 05/04/03- severe obstructive sleep apnea/hypopnea syndrome, AHI 49.7 per hour with body weight 188 pounds His machine is worn out. He has been using it all night every night including with travel, set at 8/[facility] with nasal pillows mask. He sleeps well and wife tells him he does not snore. No lung disease. Chronic atrial fibrillation. History of septoplasty in 1964 leading to nasal septal perforation. 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 Pt states that he has been doing well. Pt states that he is using CPAP at least 8 hours nightly. Pt reports no problems. Pt c/o some mucus in his throat, pnd and some sinus congestion. Pt denies current cough/wheeze/SOB/CP/tightness.  He is very comfortable with CPAP but sees the  #4 on the screen and thinks that is the pressure-probably the humidifier setting. Has 3 different machines. He has a chronic nasal septal perforation attributed to septoplasty while he was in the [employer]. It bleeds intermittently. We discussed treatment for postnasal drainage which might aggravate dryness and bleeding from this perforation.  ROS-see HPI Constitutional:   No-   weight loss, night sweats, fevers, chills, fatigue, lassitude. HEENT:   No-  headaches, difficulty swallowing, tooth/dental problems, sore throat,       No-  sneezing, itching, ear ache, +nasal congestion, post nasal drip,  CV:  No-   chest pain, orthopnea, PND, swelling in lower extremities, anasarca,                                                         dizziness, palpitations Resp: No-   shortness of breath with exertion or at rest.             + productive cough,  No non-productive cough,  No- coughing up of blood.              No-   change in color of mucus.  No- wheezing.   Skin: No-   rash or lesions. GI:  No-   heartburn, indigestion, abdominal pain, nausea, vomiting, diarrhea,                 change in bowel habits, loss of appetite GU: No-   dysuria, change in color of urine, no urgency or frequency.  No- flank pain. MS:  No-   joint pain or swelling.  No- decreased range of motion.  No- back pain. Neuro-     nothing unusual Psych:  No- change in mood or affect. No depression or anxiety.  No memory loss.  OBJ- Physical Exam    Medium build General- Alert, Oriented, Affect-appropriate, Distress- none acute Skin- rash-none, lesions- none, excoriation- none Lymphadenopathy- none Head- atraumatic            Eyes- Gross vision intact, PERRLA, conjunctivae and secretions clear            Ears- Hearing, canals-normal  Nose- Clear, no-Septal dev, mucus, polyps, erosion, + perforation with clot            Throat- Mallampati II-III , mucosa clear , drainage- none, tonsils- atrophic Neck- flexible , trachea midline, no stridor , thyroid nl, carotid no bruit Chest - symmetrical excursion , unlabored           Heart/CV- RRR-feels regular , no murmur , no gallop  , no rub, nl s1 s2                           - JVD- none , edema- none, stasis changes- none, varices- none           Lung- clear to P&A, wheeze- none, cough- none , dullness-none, rub- none           Chest wall- +pacemaker L Abd- tender-no, distended-no, bowel sounds-present, HSM- no Br/ Gen/ Rectal- Not done, not indicated Extrem- cyanosis- none, clubbing, none, atrophy- none, strength- nl Neuro- grossly intact to observation

## 2015-02-07 NOTE — Assessment & Plan Note (Signed)
Rhythm very regular to exam with pacemaker

## 2015-02-07 NOTE — Patient Instructions (Signed)
We can continue CPAP/ [facility]  Order- DME [facility]- download CPAP for pressure compliance; add AirView if able   Dx OSA   Sample Dymista nasal spray      Try 1-2 puffs each nostril once daily at bedtime. See if it helps the post-nasal drip without aggravating the dryness too much.    Fine to use the saline nasal gel as much as you wantl

## 2015-02-08 ENCOUNTER — Telehealth: Payer: Self-pay | Admitting: Cardiology

## 2015-02-08 ENCOUNTER — Ambulatory Visit (INDEPENDENT_AMBULATORY_CARE_PROVIDER_SITE_OTHER): Payer: Medicare Other | Admitting: *Deleted

## 2015-02-08 DIAGNOSIS — I442 Atrioventricular block, complete: Secondary | ICD-10-CM | POA: Diagnosis not present

## 2015-02-08 DIAGNOSIS — I480 Paroxysmal atrial fibrillation: Secondary | ICD-10-CM | POA: Diagnosis not present

## 2015-02-08 DIAGNOSIS — Z7901 Long term (current) use of anticoagulants: Secondary | ICD-10-CM | POA: Diagnosis not present

## 2015-02-08 NOTE — Telephone Encounter (Signed)
LMOVM reminding pt to send remote transmission.   

## 2015-02-09 NOTE — Progress Notes (Signed)
Remote pacemaker transmission.   

## 2015-02-14 ENCOUNTER — Telehealth: Payer: Self-pay | Admitting: Internal Medicine

## 2015-02-14 NOTE — Telephone Encounter (Signed)
Spoke with pt, states he is waiting on his pressure change for his cpap.  I advised that at our last ov we had requested a DL on his cpap, and that I see no further follow up on it from that point. Katie, please advise if you've received this download as requested, or if this needs to be resent by [facility].  Thanks!

## 2015-02-15 LAB — CUP PACEART REMOTE DEVICE CHECK
Battery Impedance: 4209 Ohm
Date Time Interrogation Session: 20170131222107
Implantable Lead Model: 5076
Lead Channel Impedance Value: 0 Ohm
Lead Channel Impedance Value: 429 Ohm
Lead Channel Pacing Threshold Pulse Width: 0.4 ms
Lead Channel Setting Sensing Sensitivity: 2 mV
MDC IDC LEAD IMPLANT DT: 20090402
MDC IDC LEAD LOCATION: 753860
MDC IDC MSMT BATTERY REMAINING LONGEVITY: 12 mo
MDC IDC MSMT BATTERY VOLTAGE: 2.68 V
MDC IDC MSMT LEADCHNL RV PACING THRESHOLD AMPLITUDE: 0.75 V
MDC IDC SET LEADCHNL RV PACING AMPLITUDE: 2.5 V
MDC IDC SET LEADCHNL RV PACING PULSEWIDTH: 0.4 ms
MDC IDC STAT BRADY RV PERCENT PACED: 100 %

## 2015-02-16 ENCOUNTER — Encounter: Payer: Self-pay | Admitting: Cardiology

## 2015-02-16 NOTE — Telephone Encounter (Signed)
Called and spoke with Dean Morgan at [facility] and requested that DL be refaxed She states no need since the report in in [place]  I have printed report and placed on CDYs cart to be reviewed

## 2015-02-16 NOTE — Telephone Encounter (Signed)
I was under the impression that DL was sent and given to CY at [facility] for patient. Please ask DME to re-send DL to be safe. Thanks

## 2015-02-16 NOTE — Telephone Encounter (Signed)
Dean Morgan, was download received? I see the note was sent to Dr. Annamaria Boots but no documentation this was received? thanks

## 2015-02-17 DIAGNOSIS — D0462 Carcinoma in situ of skin of left upper limb, including shoulder: Secondary | ICD-10-CM | POA: Diagnosis not present

## 2015-02-17 NOTE — Telephone Encounter (Signed)
Spoke with pt. He is aware of CY's recommendation. Nothing further was needed. 

## 2015-02-17 NOTE — Telephone Encounter (Signed)
His CPAP download report shows that his pressure is set at 8. The number 4 he is seeing on his machine is probably the humidifier setting. His usage and control were great, so we don't need to change anything.  Since he has 3 machines, he might want to take them to his DME company and make sure they are all set the same.

## 2015-02-18 ENCOUNTER — Encounter: Payer: Self-pay | Admitting: Cardiology

## 2015-02-24 DIAGNOSIS — Z125 Encounter for screening for malignant neoplasm of prostate: Secondary | ICD-10-CM | POA: Diagnosis not present

## 2015-02-24 DIAGNOSIS — I10 Essential (primary) hypertension: Secondary | ICD-10-CM | POA: Diagnosis not present

## 2015-02-24 DIAGNOSIS — E784 Other hyperlipidemia: Secondary | ICD-10-CM | POA: Diagnosis not present

## 2015-03-03 DIAGNOSIS — Z1389 Encounter for screening for other disorder: Secondary | ICD-10-CM | POA: Diagnosis not present

## 2015-03-03 DIAGNOSIS — E668 Other obesity: Secondary | ICD-10-CM | POA: Diagnosis not present

## 2015-03-03 DIAGNOSIS — Z23 Encounter for immunization: Secondary | ICD-10-CM | POA: Diagnosis not present

## 2015-03-03 DIAGNOSIS — I10 Essential (primary) hypertension: Secondary | ICD-10-CM | POA: Diagnosis not present

## 2015-03-03 DIAGNOSIS — G3184 Mild cognitive impairment, so stated: Secondary | ICD-10-CM | POA: Diagnosis not present

## 2015-03-03 DIAGNOSIS — I480 Paroxysmal atrial fibrillation: Secondary | ICD-10-CM | POA: Diagnosis not present

## 2015-03-03 DIAGNOSIS — Z0000 Encounter for general adult medical examination without abnormal findings: Secondary | ICD-10-CM | POA: Diagnosis not present

## 2015-03-03 DIAGNOSIS — Z6829 Body mass index (BMI) 29.0-29.9, adult: Secondary | ICD-10-CM | POA: Diagnosis not present

## 2015-03-03 DIAGNOSIS — Z7901 Long term (current) use of anticoagulants: Secondary | ICD-10-CM | POA: Diagnosis not present

## 2015-03-03 DIAGNOSIS — E291 Testicular hypofunction: Secondary | ICD-10-CM | POA: Diagnosis not present

## 2015-03-03 DIAGNOSIS — E784 Other hyperlipidemia: Secondary | ICD-10-CM | POA: Diagnosis not present

## 2015-03-04 ENCOUNTER — Encounter: Payer: Self-pay | Admitting: Internal Medicine

## 2015-03-10 DIAGNOSIS — I480 Paroxysmal atrial fibrillation: Secondary | ICD-10-CM | POA: Diagnosis not present

## 2015-03-10 DIAGNOSIS — Z7901 Long term (current) use of anticoagulants: Secondary | ICD-10-CM | POA: Diagnosis not present

## 2015-03-24 ENCOUNTER — Other Ambulatory Visit: Payer: Self-pay | Admitting: Cardiovascular Disease

## 2015-03-24 NOTE — Telephone Encounter (Signed)
Rx request sent to pharmacy.  

## 2015-04-13 DIAGNOSIS — H10503 Unspecified blepharoconjunctivitis, bilateral: Secondary | ICD-10-CM | POA: Diagnosis not present

## 2015-04-13 DIAGNOSIS — Z7901 Long term (current) use of anticoagulants: Secondary | ICD-10-CM | POA: Diagnosis not present

## 2015-04-13 DIAGNOSIS — I480 Paroxysmal atrial fibrillation: Secondary | ICD-10-CM | POA: Diagnosis not present

## 2015-05-05 DIAGNOSIS — H10501 Unspecified blepharoconjunctivitis, right eye: Secondary | ICD-10-CM | POA: Diagnosis not present

## 2015-05-10 ENCOUNTER — Ambulatory Visit (INDEPENDENT_AMBULATORY_CARE_PROVIDER_SITE_OTHER): Payer: Medicare Other | Admitting: *Deleted

## 2015-05-10 DIAGNOSIS — I442 Atrioventricular block, complete: Secondary | ICD-10-CM | POA: Diagnosis not present

## 2015-05-10 NOTE — Progress Notes (Signed)
Remote pacemaker transmission.   

## 2015-05-12 DIAGNOSIS — I480 Paroxysmal atrial fibrillation: Secondary | ICD-10-CM | POA: Diagnosis not present

## 2015-05-12 DIAGNOSIS — H10501 Unspecified blepharoconjunctivitis, right eye: Secondary | ICD-10-CM | POA: Diagnosis not present

## 2015-05-12 DIAGNOSIS — Z7901 Long term (current) use of anticoagulants: Secondary | ICD-10-CM | POA: Diagnosis not present

## 2015-05-30 DIAGNOSIS — D485 Neoplasm of uncertain behavior of skin: Secondary | ICD-10-CM | POA: Diagnosis not present

## 2015-05-30 DIAGNOSIS — L570 Actinic keratosis: Secondary | ICD-10-CM | POA: Diagnosis not present

## 2015-06-15 ENCOUNTER — Other Ambulatory Visit: Payer: Self-pay | Admitting: Cardiovascular Disease

## 2015-06-17 ENCOUNTER — Encounter: Payer: Self-pay | Admitting: Cardiology

## 2015-06-20 LAB — CUP PACEART REMOTE DEVICE CHECK
Date Time Interrogation Session: 20170502140222
Lead Channel Impedance Value: 0 Ohm
Lead Channel Impedance Value: 450 Ohm
Lead Channel Pacing Threshold Amplitude: 0.75 V
Lead Channel Pacing Threshold Pulse Width: 0.4 ms
MDC IDC LEAD IMPLANT DT: 20090402
MDC IDC LEAD LOCATION: 753860
MDC IDC MSMT BATTERY IMPEDANCE: 4518 Ohm
MDC IDC MSMT BATTERY REMAINING LONGEVITY: 11 mo
MDC IDC MSMT BATTERY VOLTAGE: 2.68 V
MDC IDC SET LEADCHNL RV PACING AMPLITUDE: 2.5 V
MDC IDC SET LEADCHNL RV PACING PULSEWIDTH: 0.4 ms
MDC IDC SET LEADCHNL RV SENSING SENSITIVITY: 2.8 mV
MDC IDC STAT BRADY RV PERCENT PACED: 100 %

## 2015-06-23 DIAGNOSIS — I480 Paroxysmal atrial fibrillation: Secondary | ICD-10-CM | POA: Diagnosis not present

## 2015-06-23 DIAGNOSIS — H10501 Unspecified blepharoconjunctivitis, right eye: Secondary | ICD-10-CM | POA: Diagnosis not present

## 2015-06-23 DIAGNOSIS — Z7901 Long term (current) use of anticoagulants: Secondary | ICD-10-CM | POA: Diagnosis not present

## 2015-06-29 ENCOUNTER — Telehealth: Payer: Self-pay | Admitting: Cardiovascular Disease

## 2015-06-29 NOTE — Telephone Encounter (Signed)
Spoke w/ pt and informed him that he could send a remote transmission in August 2017 and push out his visit w/ MD a little further. Pt verbalized understanding and agreed to send a remote transmission for 08-15-15 and will call back to schedule a MD visit for September or October.

## 2015-06-29 NOTE — Telephone Encounter (Signed)
Returned call. Patient is in [place] on vacation all summer, and wanted to minimize his return trips to [place]. He states if he needs to come in for appt in August, he will - just wanted to see if possible to set up for a remote transmission around that time. I explained that there isn't an appt set up yet, we would call him in July for office visit recall, and that we could always work this appt out to a later date when he will be back in town. Pt voiced understanding. Offered to have device clinic reach out to him regarding set up of remote transmission. Pt agreeable to this. Will route.

## 2015-06-29 NOTE — Telephone Encounter (Signed)
New message     The pt wants to see if they can make remote transmission instead of having to come in and see the Md for their yearly appt. Cause the pt is on vacation.

## 2015-07-21 DIAGNOSIS — I480 Paroxysmal atrial fibrillation: Secondary | ICD-10-CM | POA: Diagnosis not present

## 2015-07-21 DIAGNOSIS — Z7901 Long term (current) use of anticoagulants: Secondary | ICD-10-CM | POA: Diagnosis not present

## 2015-08-08 DIAGNOSIS — L570 Actinic keratosis: Secondary | ICD-10-CM | POA: Diagnosis not present

## 2015-08-15 ENCOUNTER — Telehealth: Payer: Self-pay | Admitting: Cardiology

## 2015-08-15 ENCOUNTER — Ambulatory Visit (INDEPENDENT_AMBULATORY_CARE_PROVIDER_SITE_OTHER): Payer: Medicare Other | Admitting: *Deleted

## 2015-08-15 DIAGNOSIS — I442 Atrioventricular block, complete: Secondary | ICD-10-CM | POA: Diagnosis not present

## 2015-08-15 NOTE — Telephone Encounter (Signed)
Confirmed remote transmission w/ pt wife.   

## 2015-08-16 NOTE — Progress Notes (Signed)
Remote pacemaker transmission.   

## 2015-08-17 ENCOUNTER — Encounter: Payer: Self-pay | Admitting: Cardiology

## 2015-08-18 LAB — CUP PACEART REMOTE DEVICE CHECK
Lead Channel Impedance Value: 0 Ohm
Lead Channel Impedance Value: 428 Ohm
Lead Channel Pacing Threshold Pulse Width: 0.4 ms
Lead Channel Setting Pacing Amplitude: 2.5 V
MDC IDC LEAD IMPLANT DT: 20090402
MDC IDC LEAD LOCATION: 753860
MDC IDC MSMT BATTERY IMPEDANCE: 4766 Ohm
MDC IDC MSMT BATTERY REMAINING LONGEVITY: 9 mo
MDC IDC MSMT BATTERY VOLTAGE: 2.68 V
MDC IDC MSMT LEADCHNL RV PACING THRESHOLD AMPLITUDE: 0.75 V
MDC IDC SESS DTM: 20170807201826
MDC IDC SET LEADCHNL RV PACING PULSEWIDTH: 0.4 ms
MDC IDC SET LEADCHNL RV SENSING SENSITIVITY: 2.8 mV
MDC IDC STAT BRADY RV PERCENT PACED: 100 %

## 2015-08-25 DIAGNOSIS — Z7901 Long term (current) use of anticoagulants: Secondary | ICD-10-CM | POA: Diagnosis not present

## 2015-08-25 DIAGNOSIS — I480 Paroxysmal atrial fibrillation: Secondary | ICD-10-CM | POA: Diagnosis not present

## 2015-08-25 DIAGNOSIS — Z23 Encounter for immunization: Secondary | ICD-10-CM | POA: Diagnosis not present

## 2015-09-22 DIAGNOSIS — Z7901 Long term (current) use of anticoagulants: Secondary | ICD-10-CM | POA: Diagnosis not present

## 2015-09-22 DIAGNOSIS — I480 Paroxysmal atrial fibrillation: Secondary | ICD-10-CM | POA: Diagnosis not present

## 2015-10-04 ENCOUNTER — Encounter: Payer: Self-pay | Admitting: Cardiovascular Disease

## 2015-10-04 ENCOUNTER — Ambulatory Visit (INDEPENDENT_AMBULATORY_CARE_PROVIDER_SITE_OTHER): Payer: Medicare Other | Admitting: Cardiovascular Disease

## 2015-10-04 VITALS — BP 142/92 | HR 72 | Ht 70.0 in | Wt 203.0 lb

## 2015-10-04 DIAGNOSIS — G4733 Obstructive sleep apnea (adult) (pediatric): Secondary | ICD-10-CM | POA: Diagnosis not present

## 2015-10-04 DIAGNOSIS — I442 Atrioventricular block, complete: Secondary | ICD-10-CM | POA: Diagnosis not present

## 2015-10-04 DIAGNOSIS — I482 Chronic atrial fibrillation: Secondary | ICD-10-CM | POA: Diagnosis not present

## 2015-10-04 DIAGNOSIS — Z950 Presence of cardiac pacemaker: Secondary | ICD-10-CM | POA: Diagnosis not present

## 2015-10-04 DIAGNOSIS — I4821 Permanent atrial fibrillation: Secondary | ICD-10-CM

## 2015-10-04 DIAGNOSIS — I10 Essential (primary) hypertension: Secondary | ICD-10-CM

## 2015-10-04 DIAGNOSIS — E785 Hyperlipidemia, unspecified: Secondary | ICD-10-CM

## 2015-10-04 NOTE — Patient Instructions (Signed)
Dr Sallyanne Kuster recommends that you continue on your current medications as directed. Please refer to the Current Medication list given to you today.  Remote monitoring is used to monitor your Pacemaker of ICD from home. This monitoring reduces the number of office visits required to check your device to one time per year. It allows [place] to keep an eye on the functioning of your device to ensure it is working properly. You are scheduled for a device check from home on Friday, October 27th, 2017. You may send your transmission at any time that day. If you have a wireless device, the transmission will be sent automatically. After your physician reviews your transmission, you will receive a postcard with your next transmission date.  Dr Sallyanne Kuster recommends that you schedule a follow-up appointment in 6 months with a pacemaker check. You will receive a reminder letter in the mail two months in advance. If you don't receive a letter, please call our office to schedule the follow-up appointment.  If you need a refill on your cardiac medications before your next appointment, please call your pharmacy.

## 2015-10-04 NOTE — Progress Notes (Signed)
Cardiology Office Note    Date:  10/04/2015   ID:  Dean Morgan, [name], DOB 05/10/1931, MRN [id number]  PCP:  Marton Redwood, MD  Cardiologist:   Sanda Klein, MD   Chief Complaint  Patient presents with  . 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 He also has obstructive sleep apnea, hypertension and hyperlipidemia but does not have known coronary disease or other structural heart problems.  He has really done well in the last 12 months without complaints of chest pain, dyspnea, syncope, palpitations, bleeding, falls or injuries, leg edema, claudication or neurological complaints. Over the last couple of days he has been feeling a little groggy. He wonders whether it was because of loratadine that he took for upper airway congestion.  Pacemaker interrogation shows normal device function. He is pacemaker dependent without any underlying escape rhythm. He has virtually 100% ventricular pacing. His Medtronic Adapta single-chamber pacemaker was implanted in 2009 and has an estimated 7 months of remaining longevity. Ventricular lead pacing threshold is 0.75 V at 0.4 ms pulse width. Impedance is 422 ohms there are no detectable R waves. No episodes of high ventricular rates have been recorded.    Past Medical History:  Diagnosis Date  . Arthritis   . Atrial fibrillation ([place])   . Dysrhythmia    AF  . Hyperlipemia   . Hypertension   . Pacemaker   . Pacemaker   . Sinus bradycardia seen on cardiac monitor   . Sleep apnea    does use his cpap  . Wears glasses   . Wears hearing aid     Past Surgical History:  Procedure Laterality Date  . CARDIOVERSION     multiple times-pacer put in 2009  . COLONOSCOPY    . I&D EXTREMITY Left 04/20/2013   Procedure: LEFT HAND WOUND DEBRIDEMENT AND CLOSURE ;  Surgeon: Jolyn Nap,  MD;  Location: [facility];  Service: Orthopedics;  Laterality: Left;  . KNEE ARTHROSCOPY  2/10   left  . MASS EXCISION  01/29/2012   Procedure: EXCISION MASS;  Surgeon: Hessie Dibble, MD;  Location: [facility];  Service: Orthopedics;  Laterality: Left;  excision of bone spur left hand  . METACARPAL OSTEOTOMY Left 01/13/2013   Procedure: LEFT INDEX FINGER MCP RADIAL COLLATERAL REPAIR/RECONSTRUCTION VS METACARPAL ROTATIONAL OSTEOTOMY;  Surgeon: Jolyn Nap, MD;  Location: [facility];  Service: Orthopedics;  Laterality: Left;  . PACEMAKER INSERTION  2009  . SHOULDER ARTHROSCOPY  2004   left  . THUMB ARTHROSCOPY  2007   rt thunb mass  . TONSILLECTOMY      Current Medications: Outpatient Medications Prior to Visit  Medication Sig Dispense Refill  . amLODipine (NORVASC) 5 MG tablet Take 1 tablet by mouth daily.    . Arginine 1000 MG TABS Take 1 tablet by mouth daily.     Marland Kitchen aspirin 81 MG tablet Take 81 mg by mouth daily.    . AZOR 10-40 MG tablet TAKE 1 TABLET BY MOUTH EVERY DAY 90 tablet 1  . celecoxib (CELEBREX) 200 MG capsule Take 200 mg by mouth daily.     . Cholecalciferol (VITAMIN D3) 5000 UNITS TABS Take by mouth daily.    . Cyanocobalamin (VITAMIN B-12 PO) Take 2,500 mcg by mouth daily.    Marland Kitchen  ezetimibe (ZETIA) 10 MG tablet Take 1 tablet (10 mg total) by mouth daily. 28 tablet 0  . fish oil-omega-3 fatty acids 1000 MG capsule Take 2 g by mouth daily.    Marland Kitchen glucosamine-chondroitin 500-400 MG tablet Take 1 tablet by mouth 3 (three) times daily.    . hydrochlorothiazide (MICROZIDE) 12.5 MG capsule Take 1 capsule by mouth daily.    . nebivolol (BYSTOLIC) 10 MG tablet Take 1 tablet (10 mg total) by mouth daily. 28 tablet 0  . niacin (SLO-NIACIN) 500 MG tablet Take 500 mg by mouth 2 (two) times daily with a meal.    . potassium chloride SA (K-DUR,KLOR-CON) 20 MEQ tablet TAKE ONE TABLET BY MOUTH EVERY DAY 30 tablet 6  . warfarin (COUMADIN) 4 MG  tablet Take 4 mg by mouth daily.     Marland Kitchen warfarin (COUMADIN) 2 MG tablet Take 2 mg by mouth daily. Tues,thurs,sat     No facility-administered medications prior to visit.      Allergies:   Lipitor [atorvastatin]; Penicillins; and Vytorin [ezetimibe-simvastatin]   Social History   Social History  . Marital status: Married    Spouse name: N/A  . Number of children: N/A  . Years of education: N/A   Occupational History  . [name]    Social History Main Topics  . Smoking status: Never Smoker  . Smokeless tobacco: Never Used  . Alcohol use 0.0 oz/week     Comment: wine 3-4 times a week  . Drug use: No  . Sexual activity: Not on file   Other Topics Concern  . Not on file   Social History Narrative  . No narrative on file     Family History:  The patient's family history includes Diabetes in his brother and maternal grandmother; Hypertension in his brother; Stroke in his father and paternal grandfather.   ROS:   Please see the history of present illness.    ROS All other systems reviewed and are negative.   PHYSICAL EXAM:   VS:  BP (!) 142/92   Pulse 72   Ht 5\' 10"  (1.778 m)   Wt 92.1 kg (203 lb)   BMI 29.13 kg/m    Recheck blood pressure 125/89 mmHg GEN: Well nourished, well developed, in no acute distress  HEENT: normal  Neck: no JVD, carotid bruits, or masses Cardiac: Paradoxically split second heart sound, RRR; no murmurs, rubs, or gallops,no edema ; healthy subclavian pacemaker site Respiratory:  clear to auscultation bilaterally, normal work of breathing GI: soft, nontender, nondistended, + BS MS: no deformity or atrophy  Skin: warm and dry, no rash Neuro:  Alert and Oriented x 3, Strength and sensation are intact Psych: euthymic mood, full affect  Wt Readings from Last 3 Encounters:  10/04/15 92.1 kg (203 lb)  02/07/15 86.2 kg (190 lb)  08/10/14 87.5 kg (193 lb)      Studies/Labs Reviewed:   EKG:  EKG is ordered today.  The ekg ordered today demonstrates  Atrial fibrillation with 100% ventricular pacing. QRS 180 ms, QTC 494 ms  Recent Labs: No results found for requested labs within last 8760 hours.   Lipid Panel    Component Value Date/Time   CHOL 164 06/10/2012 0815   TRIG 94 06/10/2012 0815   HDL 64 06/10/2012 0815   CHOLHDL 2.6 06/10/2012 0815   VLDL 19 06/10/2012 0815   LDLCALC 81 06/10/2012 0815       ASSESSMENT:    1. Atrial fibrillation, permanent ([place])   2.  Complete heart block ([place])   3. Pacemaker   4. Obstructive sleep apnea   5. Essential hypertension   6. Hyperlipidemia      PLAN:  In order of problems listed above:  1. AFib: Permanent. On appropriate anticoagulation with warfarin. No embolic events, but elevated embolic risk: CHADSVasc 3-5 (age 45, HTN, questionable TIA in 2002).. 2. CHB: Pacemaker dependent 3. PPM: Start monthly remote pacemaker downloads. Anticipate need for generator change out early next year. Discussed the pros and cons of continuing anticoagulation at the time of generator change out and temporary pacemaker wire. 4. OSA: Reports good compliance with CPAP 5. HTN: Controlled 6. HLP: With history of intolerance to statins. On a combination of ezetimibe and niacin. Will get his updated lipid profile from PCP    Medication Adjustments/Labs and Tests Ordered: Current medicines are reviewed at length with the patient today.  Concerns regarding medicines are outlined above.  Medication changes, Labs and Tests ordered today are listed in the Patient Instructions below. Patient Instructions  Dr Sallyanne Kuster recommends that you continue on your current medications as directed. Please refer to the Current Medication list given to you today.  Remote monitoring is used to monitor your Pacemaker of ICD from home. This monitoring reduces the number of office visits required to check your device to one time per year. It allows [place] to keep an eye on the functioning of your device to ensure it is working  properly. You are scheduled for a device check from home on Friday, October 27th, 2017. You may send your transmission at any time that day. If you have a wireless device, the transmission will be sent automatically. After your physician reviews your transmission, you will receive a postcard with your next transmission date.  Dr Sallyanne Kuster recommends that you schedule a follow-up appointment in 6 months with a pacemaker check. You will receive a reminder letter in the mail two months in advance. If you don't receive a letter, please call our office to schedule the follow-up appointment.  If you need a refill on your cardiac medications before your next appointment, please call your pharmacy.    Signed, Sanda Klein, MD  10/04/2015 1:59 PM    [facility] [place], [place], [place]  [address] Phone: [phone]; Fax: [phone]

## 2015-10-13 LAB — CUP PACEART INCLINIC DEVICE CHECK
Battery Voltage: 2.66 V
Lead Channel Impedance Value: 0 Ohm
Lead Channel Impedance Value: 422 Ohm
Lead Channel Pacing Threshold Amplitude: 0.75 V
Lead Channel Pacing Threshold Pulse Width: 0.4 ms
Lead Channel Setting Pacing Amplitude: 2.5 V
MDC IDC LEAD IMPLANT DT: 20090402
MDC IDC LEAD LOCATION: 753860
MDC IDC MSMT BATTERY IMPEDANCE: 5196 Ohm
MDC IDC MSMT BATTERY REMAINING LONGEVITY: 7 mo
MDC IDC SESS DTM: 20170926141812
MDC IDC SET LEADCHNL RV PACING PULSEWIDTH: 0.4 ms
MDC IDC SET LEADCHNL RV SENSING SENSITIVITY: 2.8 mV
MDC IDC STAT BRADY RV PERCENT PACED: 100 %

## 2015-10-26 DIAGNOSIS — L570 Actinic keratosis: Secondary | ICD-10-CM | POA: Diagnosis not present

## 2015-10-26 DIAGNOSIS — Z23 Encounter for immunization: Secondary | ICD-10-CM | POA: Diagnosis not present

## 2015-10-26 DIAGNOSIS — D485 Neoplasm of uncertain behavior of skin: Secondary | ICD-10-CM | POA: Diagnosis not present

## 2015-10-27 DIAGNOSIS — I480 Paroxysmal atrial fibrillation: Secondary | ICD-10-CM | POA: Diagnosis not present

## 2015-10-27 DIAGNOSIS — L570 Actinic keratosis: Secondary | ICD-10-CM | POA: Diagnosis not present

## 2015-10-27 DIAGNOSIS — Z7901 Long term (current) use of anticoagulants: Secondary | ICD-10-CM | POA: Diagnosis not present

## 2015-11-04 ENCOUNTER — Encounter: Payer: Medicare Other | Admitting: *Deleted

## 2015-11-11 ENCOUNTER — Encounter: Payer: Self-pay | Admitting: Cardiology

## 2015-11-14 ENCOUNTER — Ambulatory Visit (INDEPENDENT_AMBULATORY_CARE_PROVIDER_SITE_OTHER): Payer: Medicare Other | Admitting: *Deleted

## 2015-11-14 DIAGNOSIS — Z950 Presence of cardiac pacemaker: Secondary | ICD-10-CM

## 2015-11-14 NOTE — Progress Notes (Signed)
Remote pacemaker transmission.   

## 2015-11-16 ENCOUNTER — Encounter: Payer: Self-pay | Admitting: Cardiology

## 2015-11-16 DIAGNOSIS — D485 Neoplasm of uncertain behavior of skin: Secondary | ICD-10-CM | POA: Diagnosis not present

## 2015-11-16 DIAGNOSIS — Z23 Encounter for immunization: Secondary | ICD-10-CM | POA: Diagnosis not present

## 2015-11-16 DIAGNOSIS — L570 Actinic keratosis: Secondary | ICD-10-CM | POA: Diagnosis not present

## 2015-11-18 DIAGNOSIS — C44329 Squamous cell carcinoma of skin of other parts of face: Secondary | ICD-10-CM | POA: Diagnosis not present

## 2015-11-27 LAB — CUP PACEART REMOTE DEVICE CHECK
Battery Remaining Longevity: 7 mo
Brady Statistic RV Percent Paced: 100 %
Date Time Interrogation Session: 20171106163613
Implantable Lead Location: 753860
Implantable Lead Model: 5076
Lead Channel Impedance Value: 431 Ohm
Lead Channel Pacing Threshold Amplitude: 0.75 V
Lead Channel Setting Pacing Amplitude: 2.5 V
Lead Channel Setting Pacing Pulse Width: 0.4 ms
MDC IDC LEAD IMPLANT DT: 20090402
MDC IDC MSMT BATTERY IMPEDANCE: 5178 Ohm
MDC IDC MSMT BATTERY VOLTAGE: 2.68 V
MDC IDC MSMT LEADCHNL RA IMPEDANCE VALUE: 0 Ohm
MDC IDC MSMT LEADCHNL RV PACING THRESHOLD PULSEWIDTH: 0.4 ms
MDC IDC PG IMPLANT DT: 20090402
MDC IDC SET LEADCHNL RV SENSING SENSITIVITY: 2.8 mV

## 2015-11-28 DIAGNOSIS — Z7901 Long term (current) use of anticoagulants: Secondary | ICD-10-CM | POA: Diagnosis not present

## 2015-11-28 DIAGNOSIS — I480 Paroxysmal atrial fibrillation: Secondary | ICD-10-CM | POA: Diagnosis not present

## 2015-11-30 DIAGNOSIS — C4442 Squamous cell carcinoma of skin of scalp and neck: Secondary | ICD-10-CM | POA: Diagnosis not present

## 2015-12-15 ENCOUNTER — Ambulatory Visit (INDEPENDENT_AMBULATORY_CARE_PROVIDER_SITE_OTHER): Payer: Medicare Other | Admitting: *Deleted

## 2015-12-15 DIAGNOSIS — I442 Atrioventricular block, complete: Secondary | ICD-10-CM

## 2015-12-15 DIAGNOSIS — Z950 Presence of cardiac pacemaker: Secondary | ICD-10-CM

## 2015-12-15 NOTE — Progress Notes (Signed)
Remote pacemaker transmission.   

## 2015-12-16 ENCOUNTER — Other Ambulatory Visit: Payer: Self-pay | Admitting: Cardiovascular Disease

## 2015-12-21 ENCOUNTER — Encounter: Payer: Self-pay | Admitting: Cardiology

## 2015-12-29 DIAGNOSIS — Z7901 Long term (current) use of anticoagulants: Secondary | ICD-10-CM | POA: Diagnosis not present

## 2015-12-29 DIAGNOSIS — I480 Paroxysmal atrial fibrillation: Secondary | ICD-10-CM | POA: Diagnosis not present

## 2016-01-11 LAB — CUP PACEART REMOTE DEVICE CHECK
Battery Impedance: 5245 Ohm
Battery Voltage: 2.67 V
Date Time Interrogation Session: 20171207151410
Implantable Lead Location: 753860
Lead Channel Impedance Value: 0 Ohm
Lead Channel Impedance Value: 448 Ohm
Lead Channel Pacing Threshold Pulse Width: 0.4 ms
Lead Channel Setting Pacing Amplitude: 2.5 V
Lead Channel Setting Sensing Sensitivity: 2.8 mV
MDC IDC LEAD IMPLANT DT: 20090402
MDC IDC MSMT BATTERY REMAINING LONGEVITY: 7 mo
MDC IDC MSMT LEADCHNL RV PACING THRESHOLD AMPLITUDE: 0.75 V
MDC IDC PG IMPLANT DT: 20090402
MDC IDC SET LEADCHNL RV PACING PULSEWIDTH: 0.4 ms
MDC IDC STAT BRADY RV PERCENT PACED: 100 %

## 2016-02-06 DIAGNOSIS — L821 Other seborrheic keratosis: Secondary | ICD-10-CM | POA: Diagnosis not present

## 2016-02-06 DIAGNOSIS — D485 Neoplasm of uncertain behavior of skin: Secondary | ICD-10-CM | POA: Diagnosis not present

## 2016-02-06 DIAGNOSIS — Z85828 Personal history of other malignant neoplasm of skin: Secondary | ICD-10-CM | POA: Diagnosis not present

## 2016-02-06 DIAGNOSIS — L814 Other melanin hyperpigmentation: Secondary | ICD-10-CM | POA: Diagnosis not present

## 2016-02-06 DIAGNOSIS — L570 Actinic keratosis: Secondary | ICD-10-CM | POA: Diagnosis not present

## 2016-02-06 DIAGNOSIS — D1801 Hemangioma of skin and subcutaneous tissue: Secondary | ICD-10-CM | POA: Diagnosis not present

## 2016-02-07 ENCOUNTER — Encounter: Payer: Self-pay | Admitting: Internal Medicine

## 2016-02-07 DIAGNOSIS — L905 Scar conditions and fibrosis of skin: Secondary | ICD-10-CM | POA: Diagnosis not present

## 2016-02-07 DIAGNOSIS — I480 Paroxysmal atrial fibrillation: Secondary | ICD-10-CM | POA: Diagnosis not present

## 2016-02-07 DIAGNOSIS — L281 Prurigo nodularis: Secondary | ICD-10-CM | POA: Diagnosis not present

## 2016-02-07 DIAGNOSIS — Z7901 Long term (current) use of anticoagulants: Secondary | ICD-10-CM | POA: Diagnosis not present

## 2016-02-08 ENCOUNTER — Ambulatory Visit (INDEPENDENT_AMBULATORY_CARE_PROVIDER_SITE_OTHER): Payer: Medicare Other | Admitting: Internal Medicine

## 2016-02-08 ENCOUNTER — Encounter: Payer: Self-pay | Admitting: Internal Medicine

## 2016-02-08 VITALS — BP 132/90 | HR 67 | Ht 70.0 in | Wt 205.6 lb

## 2016-02-08 DIAGNOSIS — G4733 Obstructive sleep apnea (adult) (pediatric): Secondary | ICD-10-CM | POA: Diagnosis not present

## 2016-02-08 DIAGNOSIS — I4821 Permanent atrial fibrillation: Secondary | ICD-10-CM

## 2016-02-08 DIAGNOSIS — I482 Chronic atrial fibrillation: Secondary | ICD-10-CM | POA: Diagnosis not present

## 2016-02-08 NOTE — Assessment & Plan Note (Signed)
He remains very comfortable with CPAP 8. Residual AHI this time is 6.4 but any change to reduce this would only be cosmetic with no meaningful medical impact so we're leaving the pressure at 8 this time.

## 2016-02-08 NOTE — Progress Notes (Signed)
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 Pt states that he has been doing well. Pt states that he is using CPAP at least 8 hours nightly. Pt reports no problems. Pt c/o some mucus in his throat, pnd and some sinus congestion. Pt denies current cough/wheeze/SOB/CP/tightness.  He is very comfortable with CPAP but sees the  #4 on the screen and thinks that is the pressure-probably the humidifier setting. Has 3 different machines. He has a chronic nasal septal perforation attributed to septoplasty while he was in the [employer]. It bleeds intermittently. We discussed treatment for postnasal drainage which might aggravate dryness and bleeding from this perforation.  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 Denies problems with mask/pressure. DME:  He feels he is sleeping very well and doing very well with CPAP which has improved quality of life. He denies snoring or daytime sleepiness. Download shows 100% 4 hour compliance with residual AHI 6.4. We decided to leave pressure where it is.  ROS-see HPI              + = pos Constitutional:   No-   weight loss, night sweats, fevers, chills, fatigue, lassitude. HEENT:   No-  headaches, difficulty swallowing, tooth/dental problems, sore throat,       No-  sneezing, itching, ear ache, +nasal congestion, post nasal drip,  CV:   No-   chest pain, orthopnea, PND, swelling in lower extremities, anasarca,                                                        dizziness, palpitations Resp: No-   shortness of breath with exertion or at rest.              productive cough,  No non-productive cough,  No- coughing up of blood.              No-   change in color of mucus.  No- wheezing.   Skin: No-   rash or lesions. GI:  No-   heartburn, indigestion, abdominal pain, nausea, vomiting, diarrhea,                 change in bowel habits, loss of appetite GU: No-   dysuria, change in color of urine, no urgency or frequency.  No- flank pain. MS:  No-   joint pain or swelling.  No- decreased range of motion.  No- back pain. Neuro-     nothing unusual Psych:  No- change in mood or affect. No depression or anxiety.  No memory loss.  OBJ- Physical Exam    Medium build General- Alert, Oriented, Affect-appropriate, Distress- none acute Skin- rash-none, lesions- none, excoriation- none. + Scab on lower lip after resection squamous cell CA Lymphadenopathy- none Head- atraumatic            Eyes- Gross vision intact, PERRLA, conjunctivae and secretions clear  Ears- Hearing, canals-normal            Nose- Clear, no-Septal dev, mucus, polyps, erosion, + perforation with clot            Throat- Mallampati II-III , mucosa clear , drainage- none, tonsils- atrophic Neck- flexible , trachea midline, no stridor , thyroid nl, carotid no bruit Chest - symmetrical excursion , unlabored           Heart/CV- RRR-feels regular , no murmur , no gallop  , no rub, nl s1 s2                           - JVD- none , edema- none, stasis changes- none, varices- none           Lung- clear to P&A, wheeze- none, cough- none , dullness-none, rub- none           Chest wall- +pacemaker L Abd- tender-no, distended-no, bowel sounds-present, HSM- no Br/ Gen/ Rectal- Not done, not indicated Extrem- cyanosis- none, clubbing, none, atrophy- none, strength-  nl Neuro- grossly intact to observation

## 2016-02-08 NOTE — Patient Instructions (Signed)
We can continue CPAP 8/ DME [facility], mask of choice, humidifier, supplies, [employer]View    Dx OSA   Please call if we can help

## 2016-02-08 NOTE — Assessment & Plan Note (Signed)
Pacemaker functioning by his report without concerns. Pulse feels regular on exam today. He is managed by cardiology.

## 2016-02-28 DIAGNOSIS — Z0000 Encounter for general adult medical examination without abnormal findings: Secondary | ICD-10-CM | POA: Diagnosis not present

## 2016-02-29 DIAGNOSIS — I10 Essential (primary) hypertension: Secondary | ICD-10-CM | POA: Diagnosis not present

## 2016-02-29 DIAGNOSIS — E784 Other hyperlipidemia: Secondary | ICD-10-CM | POA: Diagnosis not present

## 2016-03-07 DIAGNOSIS — Z6832 Body mass index (BMI) 32.0-32.9, adult: Secondary | ICD-10-CM | POA: Diagnosis not present

## 2016-03-07 DIAGNOSIS — I10 Essential (primary) hypertension: Secondary | ICD-10-CM | POA: Diagnosis not present

## 2016-03-07 DIAGNOSIS — Z0000 Encounter for general adult medical examination without abnormal findings: Secondary | ICD-10-CM | POA: Diagnosis not present

## 2016-03-07 DIAGNOSIS — I480 Paroxysmal atrial fibrillation: Secondary | ICD-10-CM | POA: Diagnosis not present

## 2016-03-07 DIAGNOSIS — E669 Obesity, unspecified: Secondary | ICD-10-CM | POA: Diagnosis not present

## 2016-03-07 DIAGNOSIS — G3184 Mild cognitive impairment, so stated: Secondary | ICD-10-CM | POA: Diagnosis not present

## 2016-03-07 DIAGNOSIS — E291 Testicular hypofunction: Secondary | ICD-10-CM | POA: Diagnosis not present

## 2016-03-07 DIAGNOSIS — Z7901 Long term (current) use of anticoagulants: Secondary | ICD-10-CM | POA: Diagnosis not present

## 2016-03-07 DIAGNOSIS — E784 Other hyperlipidemia: Secondary | ICD-10-CM | POA: Diagnosis not present

## 2016-03-07 DIAGNOSIS — Z1389 Encounter for screening for other disorder: Secondary | ICD-10-CM | POA: Diagnosis not present

## 2016-03-08 ENCOUNTER — Ambulatory Visit (INDEPENDENT_AMBULATORY_CARE_PROVIDER_SITE_OTHER): Payer: Medicare Other | Admitting: Cardiovascular Disease

## 2016-03-08 ENCOUNTER — Encounter: Payer: Self-pay | Admitting: Cardiovascular Disease

## 2016-03-08 VITALS — BP 145/83 | HR 82 | Ht 70.0 in | Wt 208.0 lb

## 2016-03-08 DIAGNOSIS — G4733 Obstructive sleep apnea (adult) (pediatric): Secondary | ICD-10-CM | POA: Diagnosis not present

## 2016-03-08 DIAGNOSIS — E7800 Pure hypercholesterolemia, unspecified: Secondary | ICD-10-CM

## 2016-03-08 DIAGNOSIS — I442 Atrioventricular block, complete: Secondary | ICD-10-CM | POA: Diagnosis not present

## 2016-03-08 DIAGNOSIS — Z950 Presence of cardiac pacemaker: Secondary | ICD-10-CM | POA: Diagnosis not present

## 2016-03-08 DIAGNOSIS — I10 Essential (primary) hypertension: Secondary | ICD-10-CM | POA: Diagnosis not present

## 2016-03-08 DIAGNOSIS — I4821 Permanent atrial fibrillation: Secondary | ICD-10-CM

## 2016-03-08 DIAGNOSIS — I482 Chronic atrial fibrillation: Secondary | ICD-10-CM

## 2016-03-08 NOTE — Patient Instructions (Signed)
Dr Sallyanne Kuster recommends that you continue on your current medications as directed. Please refer to the Current Medication list given to you today.  Remote monitoring is used to monitor your Pacemaker of ICD from home. This monitoring reduces the number of office visits required to check your device to one time per year. It allows [place] to keep an eye on the functioning of your device to ensure it is working properly. You are scheduled for a device check from home on Monday, April 2nd, 2018. You may send your transmission at any time that day. If you have a wireless device, the transmission will be sent automatically. After your physician reviews your transmission, you will receive a postcard with your next transmission date.  Dr Sallyanne Kuster recommends that you schedule a follow-up appointment in 5 months with a pacemaker check. You will receive a reminder letter in the mail two months in advance. If you don't receive a letter, please call our office to schedule the follow-up appointment.  If you need a refill on your cardiac medications before your next appointment, please call your pharmacy.

## 2016-03-08 NOTE — Progress Notes (Signed)
Cardiology Office Note    Date:  03/09/2016   ID:  [name], [name], DOB Mar 18, 1931, MRN [id number]  PCP:  Marton Redwood, MD  Cardiologist:   Sanda Klein, MD   Chief Complaint  Patient presents with  . 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 He also has obstructive sleep apnea, hypertension and hyperlipidemia but does not have known coronary disease or other structural heart problems.  He feels well, without complaints of chest pain, dyspnea, syncope, palpitations, bleeding, falls or injuries, leg edema, claudication or neurological complaints.   Pacemaker interrogation shows normal device function. He is pacemaker dependent without any underlying escape rhythm. He has virtually 100% ventricular pacing. His Medtronic Adapta single-chamber pacemaker was implanted in 2009 and has an estimated 5 months of remaining longevity. Ventricular lead pacing threshold is 0.75 V at 0.4 ms pulse width. Impedance is stable; there are no detectable R waves. No episodes of high ventricular rates have been recorded.    Past Medical History:  Diagnosis Date  . Arthritis   . Atrial fibrillation ([place])   . Dysrhythmia    AF  . Hyperlipemia   . Hypertension   . Pacemaker   . Pacemaker   . Sinus bradycardia seen on cardiac monitor   . Sleep apnea    does use his cpap  . Wears glasses   . Wears hearing aid     Past Surgical History:  Procedure Laterality Date  . CARDIOVERSION     multiple times-pacer put in 2009  . COLONOSCOPY    . I&D EXTREMITY Left 04/20/2013   Procedure: LEFT HAND WOUND DEBRIDEMENT AND CLOSURE ;  Surgeon: Jolyn Nap, MD;  Location: [facility];  Service: Orthopedics;  Laterality: Left;  . KNEE ARTHROSCOPY  2/10   left  . MASS EXCISION  01/29/2012   Procedure: EXCISION MASS;  Surgeon: Hessie Dibble, MD;  Location:  [facility];  Service: Orthopedics;  Laterality: Left;  excision of bone spur left hand  . METACARPAL OSTEOTOMY Left 01/13/2013   Procedure: LEFT INDEX FINGER MCP RADIAL COLLATERAL REPAIR/RECONSTRUCTION VS METACARPAL ROTATIONAL OSTEOTOMY;  Surgeon: Jolyn Nap, MD;  Location: [facility];  Service: Orthopedics;  Laterality: Left;  . PACEMAKER INSERTION  2009  . SHOULDER ARTHROSCOPY  2004   left  . THUMB ARTHROSCOPY  2007   rt thunb mass  . TONSILLECTOMY      Current Medications: Outpatient Medications Prior to Visit  Medication Sig Dispense Refill  . amLODipine (NORVASC) 5 MG tablet Take 1 tablet by mouth daily.    . Arginine 1000 MG TABS Take 1 tablet by mouth daily.     Marland Kitchen aspirin 81 MG tablet Take 81 mg by mouth daily.    . AZOR 10-40 MG tablet TAKE 1 TABLET BY MOUTH EVERY DAY 90 tablet 1  . BYSTOLIC 10 MG tablet TAKE 1 TABLET BY MOUTH EVERY DAY 30 tablet 8  . celecoxib (CELEBREX) 200 MG capsule Take 200 mg by mouth daily.     . Cholecalciferol (VITAMIN D3) 5000 UNITS TABS Take by mouth daily.    . Cyanocobalamin (VITAMIN B-12 PO) Take 2,500 mcg by mouth daily.    Marland Kitchen ezetimibe (ZETIA) 10 MG tablet Take 1 tablet (10 mg total) by mouth daily. 28 tablet 0  . fish oil-omega-3 fatty acids 1000 MG  capsule Take 2 g by mouth daily.    Marland Kitchen glucosamine-chondroitin 500-400 MG tablet Take 1 tablet by mouth 3 (three) times daily.    . hydrochlorothiazide (MICROZIDE) 12.5 MG capsule Take 1 capsule by mouth daily.    . niacin (SLO-NIACIN) 500 MG tablet Take 500 mg by mouth 2 (two) times daily with a meal.    . potassium chloride SA (K-DUR,KLOR-CON) 20 MEQ tablet TAKE ONE TABLET BY MOUTH EVERY DAY 30 tablet 6  . warfarin (COUMADIN) 4 MG tablet Take 4 mg by mouth daily.      No facility-administered medications prior to visit.      Allergies:   Lipitor [atorvastatin]; Penicillins; and Vytorin [ezetimibe-simvastatin]   Social History   Social History  . Marital  status: Married    Spouse name: N/A  . Number of children: N/A  . Years of education: N/A   Occupational History  . [name]    Social History Main Topics  . Smoking status: Never Smoker  . Smokeless tobacco: Never Used  . Alcohol use 0.0 oz/week     Comment: wine 3-4 times a week  . Drug use: No  . Sexual activity: Not Asked   Other Topics Concern  . None   Social History Narrative  . None     Family History:  The patient's family history includes Diabetes in his brother and maternal grandmother; Hypertension in his brother; Stroke in his father and paternal grandfather.   ROS:   Please see the history of present illness.    ROS All other systems reviewed and are negative.   PHYSICAL EXAM:   VS:  BP (!) 145/83 (BP Location: Left Arm, Patient Position: Sitting, Cuff Size: Normal)   Pulse 82   Ht 5\' 10"  (1.778 m)   Wt 94.3 kg (208 lb)   BMI 29.84 kg/m    Recheck blood pressure 125/89 mmHg GEN: Well nourished, well developed, in no acute distress  HEENT: normal  Neck: no JVD, carotid bruits, or masses Cardiac: Paradoxically split second heart sound, RRR; no murmurs, rubs, or gallops,no edema ; healthy subclavian pacemaker site Respiratory:  clear to auscultation bilaterally, normal work of breathing GI: soft, nontender, nondistended, + BS MS: no deformity or atrophy  Skin: warm and dry, no rash Neuro:  Alert and Oriented x 3, Strength and sensation are intact Psych: euthymic mood, full affect  Wt Readings from Last 3 Encounters:  03/08/16 94.3 kg (208 lb)  02/08/16 93.3 kg (205 lb 9.6 oz)  10/04/15 92.1 kg (203 lb)     Studies/Labs Reviewed:   EKG:  EKG is ordered today.  The ekg ordered today demonstrates Atrial fibrillation with 100% ventricular pacing.    Lipid Panel    Component Value Date/Time   CHOL 164 06/10/2012 0815   TRIG 94 06/10/2012 0815   HDL 64 06/10/2012 0815   CHOLHDL 2.6 06/10/2012 0815   VLDL 19 06/10/2012 0815   LDLCALC 81 06/10/2012  0815     ASSESSMENT:    1. Atrial fibrillation, permanent ([place])   2. Complete heart block ([place])   3. Pacemaker   4. Obstructive sleep apnea   5. Essential hypertension   6. Pure hypercholesterolemia      PLAN:  In order of problems listed above:  1. AFib: Permanent. On appropriate anticoagulation with warfarin. No embolic events, but elevated embolic risk: CHADSVasc 3-5 (age 70, HTN, questionable TIA in 2002). 2. CHB: Pacemaker dependent 3. PPM: Approaching ERI, doing monthly remote pacemaker downloads.  Anticipate need for generator change out this summer. Discussed the pros and cons of continuing anticoagulation at the time of generator change out and possible use of temporary pacemaker wire. 4. OSA: Reports good compliance with CPAP 5. HTN: Controlled 6. HLP: With history of intolerance to statins. On a combination of ezetimibe and niacin.     Medication Adjustments/Labs and Tests Ordered: Current medicines are reviewed at length with the patient today.  Concerns regarding medicines are outlined above.  Medication changes, Labs and Tests ordered today are listed in the Patient Instructions below. Patient Instructions  Dr Sallyanne Kuster recommends that you continue on your current medications as directed. Please refer to the Current Medication list given to you today.  Remote monitoring is used to monitor your Pacemaker of ICD from home. This monitoring reduces the number of office visits required to check your device to one time per year. It allows [place] to keep an eye on the functioning of your device to ensure it is working properly. You are scheduled for a device check from home on Monday, April 2nd, 2018. You may send your transmission at any time that day. If you have a wireless device, the transmission will be sent automatically. After your physician reviews your transmission, you will receive a postcard with your next transmission date.  Dr Sallyanne Kuster recommends that you schedule a  follow-up appointment in 5 months with a pacemaker check. You will receive a reminder letter in the mail two months in advance. If you don't receive a letter, please call our office to schedule the follow-up appointment.  If you need a refill on your cardiac medications before your next appointment, please call your pharmacy.    Signed, Sanda Klein, MD  03/09/2016 10:20 AM    [facility] [place], [place], [place]  [address] Phone: [phone]; Fax: [phone]

## 2016-03-22 DIAGNOSIS — I480 Paroxysmal atrial fibrillation: Secondary | ICD-10-CM | POA: Diagnosis not present

## 2016-03-22 DIAGNOSIS — Z7901 Long term (current) use of anticoagulants: Secondary | ICD-10-CM | POA: Diagnosis not present

## 2016-04-09 ENCOUNTER — Ambulatory Visit (INDEPENDENT_AMBULATORY_CARE_PROVIDER_SITE_OTHER): Payer: Medicare Other | Admitting: *Deleted

## 2016-04-09 ENCOUNTER — Telehealth: Payer: Self-pay | Admitting: Cardiology

## 2016-04-09 DIAGNOSIS — I442 Atrioventricular block, complete: Secondary | ICD-10-CM | POA: Diagnosis not present

## 2016-04-09 NOTE — Telephone Encounter (Signed)
LMOVM reminding pt to send remote transmission.   

## 2016-04-10 ENCOUNTER — Encounter: Payer: Self-pay | Admitting: Cardiology

## 2016-04-10 NOTE — Progress Notes (Signed)
Remote pacemaker transmission.   

## 2016-04-11 LAB — CUP PACEART REMOTE DEVICE CHECK
Battery Impedance: 5787 Ohm
Battery Remaining Longevity: 5 mo
Battery Voltage: 2.64 V
Brady Statistic RV Percent Paced: 100 %
Implantable Lead Implant Date: 20090402
Implantable Lead Location: 753860
Implantable Lead Model: 5076
Lead Channel Impedance Value: 0 Ohm
Lead Channel Pacing Threshold Amplitude: 0.875 V
Lead Channel Setting Pacing Amplitude: 2.5 V
Lead Channel Setting Pacing Pulse Width: 0.4 ms
Lead Channel Setting Sensing Sensitivity: 2.8 mV
MDC IDC MSMT LEADCHNL RV IMPEDANCE VALUE: 432 Ohm
MDC IDC MSMT LEADCHNL RV PACING THRESHOLD PULSEWIDTH: 0.4 ms
MDC IDC PG IMPLANT DT: 20090402
MDC IDC SESS DTM: 20180403020033

## 2016-04-17 DIAGNOSIS — Z7901 Long term (current) use of anticoagulants: Secondary | ICD-10-CM | POA: Diagnosis not present

## 2016-04-17 DIAGNOSIS — I480 Paroxysmal atrial fibrillation: Secondary | ICD-10-CM | POA: Diagnosis not present

## 2016-05-09 ENCOUNTER — Ambulatory Visit (INDEPENDENT_AMBULATORY_CARE_PROVIDER_SITE_OTHER): Payer: Medicare Other | Admitting: *Deleted

## 2016-05-09 DIAGNOSIS — I442 Atrioventricular block, complete: Secondary | ICD-10-CM

## 2016-05-09 NOTE — Progress Notes (Signed)
Remote pacemaker transmission.   

## 2016-05-10 ENCOUNTER — Encounter: Payer: Self-pay | Admitting: Cardiology

## 2016-05-10 DIAGNOSIS — I480 Paroxysmal atrial fibrillation: Secondary | ICD-10-CM | POA: Diagnosis not present

## 2016-05-10 DIAGNOSIS — Z7901 Long term (current) use of anticoagulants: Secondary | ICD-10-CM | POA: Diagnosis not present

## 2016-05-11 ENCOUNTER — Other Ambulatory Visit: Payer: Self-pay | Admitting: Cardiovascular Disease

## 2016-05-11 LAB — CUP PACEART REMOTE DEVICE CHECK
Battery Voltage: 2.65 V
Implantable Lead Implant Date: 20090402
Implantable Lead Location: 753860
Implantable Pulse Generator Implant Date: 20090402
Lead Channel Impedance Value: 0 Ohm
Lead Channel Pacing Threshold Amplitude: 0.75 V
Lead Channel Pacing Threshold Pulse Width: 0.4 ms
Lead Channel Setting Pacing Amplitude: 2.5 V
MDC IDC MSMT BATTERY IMPEDANCE: 6122 Ohm
MDC IDC MSMT BATTERY REMAINING LONGEVITY: 4 mo
MDC IDC MSMT LEADCHNL RV IMPEDANCE VALUE: 454 Ohm
MDC IDC SESS DTM: 20180502130849
MDC IDC SET LEADCHNL RV PACING PULSEWIDTH: 0.4 ms
MDC IDC SET LEADCHNL RV SENSING SENSITIVITY: 2.8 mV
MDC IDC STAT BRADY RV PERCENT PACED: 100 %

## 2016-06-12 DIAGNOSIS — L910 Hypertrophic scar: Secondary | ICD-10-CM | POA: Diagnosis not present

## 2016-06-13 DIAGNOSIS — Z7901 Long term (current) use of anticoagulants: Secondary | ICD-10-CM | POA: Diagnosis not present

## 2016-06-13 DIAGNOSIS — I480 Paroxysmal atrial fibrillation: Secondary | ICD-10-CM | POA: Diagnosis not present

## 2016-07-09 ENCOUNTER — Ambulatory Visit (INDEPENDENT_AMBULATORY_CARE_PROVIDER_SITE_OTHER): Payer: Medicare Other | Admitting: *Deleted

## 2016-07-09 ENCOUNTER — Telehealth: Payer: Self-pay | Admitting: Cardiology

## 2016-07-09 DIAGNOSIS — I442 Atrioventricular block, complete: Secondary | ICD-10-CM

## 2016-07-09 NOTE — Telephone Encounter (Signed)
LMOVM reminding pt to send remote transmission.   

## 2016-07-10 ENCOUNTER — Encounter: Payer: Self-pay | Admitting: Cardiology

## 2016-07-10 LAB — CUP PACEART REMOTE DEVICE CHECK
Battery Impedance: 6770 Ohm
Brady Statistic RV Percent Paced: 100 %
Implantable Pulse Generator Implant Date: 20090402
Lead Channel Impedance Value: 0 Ohm
Lead Channel Impedance Value: 459 Ohm
Lead Channel Pacing Threshold Pulse Width: 0.4 ms
Lead Channel Setting Pacing Amplitude: 2.5 V
Lead Channel Setting Sensing Sensitivity: 2.8 mV
MDC IDC LEAD IMPLANT DT: 20090402
MDC IDC LEAD LOCATION: 753860
MDC IDC MSMT BATTERY REMAINING LONGEVITY: 2 mo
MDC IDC MSMT BATTERY VOLTAGE: 2.63 V
MDC IDC MSMT LEADCHNL RV PACING THRESHOLD AMPLITUDE: 0.75 V
MDC IDC SESS DTM: 20180703125424
MDC IDC SET LEADCHNL RV PACING PULSEWIDTH: 0.4 ms

## 2016-07-10 NOTE — Progress Notes (Signed)
Remote pacemaker transmission.   

## 2016-07-24 DIAGNOSIS — I480 Paroxysmal atrial fibrillation: Secondary | ICD-10-CM | POA: Diagnosis not present

## 2016-07-24 DIAGNOSIS — Z7901 Long term (current) use of anticoagulants: Secondary | ICD-10-CM | POA: Diagnosis not present

## 2016-08-07 NOTE — Telephone Encounter (Signed)
No note in chart

## 2016-08-08 ENCOUNTER — Encounter: Payer: Self-pay | Admitting: Cardiovascular Disease

## 2016-08-08 ENCOUNTER — Ambulatory Visit (INDEPENDENT_AMBULATORY_CARE_PROVIDER_SITE_OTHER): Payer: Medicare Other | Admitting: Cardiovascular Disease

## 2016-08-08 VITALS — BP 163/96 | HR 99 | Ht 70.0 in | Wt 202.8 lb

## 2016-08-08 DIAGNOSIS — I10 Essential (primary) hypertension: Secondary | ICD-10-CM | POA: Diagnosis not present

## 2016-08-08 DIAGNOSIS — G4733 Obstructive sleep apnea (adult) (pediatric): Secondary | ICD-10-CM | POA: Diagnosis not present

## 2016-08-08 DIAGNOSIS — I4821 Permanent atrial fibrillation: Secondary | ICD-10-CM

## 2016-08-08 DIAGNOSIS — E7800 Pure hypercholesterolemia, unspecified: Secondary | ICD-10-CM | POA: Diagnosis not present

## 2016-08-08 DIAGNOSIS — I482 Chronic atrial fibrillation: Secondary | ICD-10-CM | POA: Diagnosis not present

## 2016-08-08 DIAGNOSIS — I442 Atrioventricular block, complete: Secondary | ICD-10-CM | POA: Diagnosis not present

## 2016-08-08 DIAGNOSIS — Z950 Presence of cardiac pacemaker: Secondary | ICD-10-CM

## 2016-08-08 MED ORDER — HYDROCHLOROTHIAZIDE 12.5 MG PO CAPS: ORAL_CAPSULE | ORAL | 3 refills | Status: DC

## 2016-08-08 NOTE — Patient Instructions (Signed)
Dr Sallyanne Kuster has recommended making the following medication changes: 1. INCREASE Hydrochlorothiazide  Take 2 tablets by mouth on Mondays, Wednesdays, and Fridays  Take 1 tablet by mouth on Tuesdays, Thursdays, Saturdays, and Sundays  Dr C recommends you have your pacemaker generator changed. This has been scheduled for Wednesday, September 19th, 2018 at 1:30p. You will need to arrive at [facility] at 11:30a on the day of your procedure. Instructions have been provided.

## 2016-08-08 NOTE — Progress Notes (Signed)
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 Follow-up    6 months; Pt states no Sx.     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 He also has obstructive sleep apnea, hypertension and hyperlipidemia but does not have known coronary disease or other structural heart problems.  The patient specifically denies any chest pain at rest exertion, dyspnea at rest or with exertion, orthopnea, paroxysmal nocturnal dyspnea, syncope, palpitations, focal neurological deficits, intermittent claudication, lower extremity edema, unexplained weight gain, cough, hemoptysis or wheezing.  Pacemaker interrogation shows normal device function. He is pacemaker dependent without any underlying escape rhythm. His Medtronic Adapta single-chamber pacemaker was implanted in 2009 and has an estimated 1-2 months of remaining longevity. Ventricular lead pacing threshold is 0.75 V at 0.4 ms pulse width. Impedance is stable; there are no detectable R waves. No episodes of high ventricular rates have been recorded.  He does not check his blood pressure on a daily basis, but when he has done so he has noticed that is frequently elevated, 160s over mid 90s. He reports compliance with sodium restriction and his anti-hypertensive medications. He is on maximum doses of amlodipine and almost certain and also takes relatively low doses of bystolic and hydrochlorothiazide.  Past Medical History:  Diagnosis Date  . Arthritis   . Atrial fibrillation ([place])   . Dysrhythmia    AF  . Hyperlipemia   . Hypertension   . Pacemaker   . Pacemaker   . Sinus bradycardia seen on cardiac monitor   . Sleep apnea    does use his cpap  . Wears glasses   . Wears  hearing aid     Past Surgical History:  Procedure Laterality Date  . CARDIOVERSION     multiple times-pacer put in 2009  . COLONOSCOPY    . I&D EXTREMITY Left 04/20/2013   Procedure: LEFT HAND WOUND DEBRIDEMENT AND CLOSURE ;  Surgeon: Dean Nap, MD;  Location: [place];  Service: Orthopedics;  Laterality: Left;  . KNEE ARTHROSCOPY  2/10   left  . MASS EXCISION  01/29/2012   Procedure: EXCISION MASS;  Surgeon: Dean Dibble, MD;  Location: [place];  Service: Orthopedics;  Laterality: Left;  excision of bone spur left hand  . METACARPAL OSTEOTOMY Left 01/13/2013   Procedure: LEFT INDEX FINGER MCP RADIAL COLLATERAL REPAIR/RECONSTRUCTION VS METACARPAL ROTATIONAL OSTEOTOMY;  Surgeon: Dean Nap, MD;  Location: [place];  Service: Orthopedics;  Laterality: Left;  . PACEMAKER INSERTION  2009  . SHOULDER ARTHROSCOPY  2004   left  . THUMB ARTHROSCOPY  2007   rt thunb mass  . TONSILLECTOMY      Current Medications: Outpatient Medications Prior to Visit  Medication Sig Dispense Refill  . amLODipine (NORVASC) 5 MG tablet Take 1 tablet by mouth daily.    . Arginine 1000 MG TABS Take 1 tablet by mouth daily.     Marland Kitchen aspirin 81 MG tablet Take 81 mg by mouth daily.    . AZOR 10-40 MG tablet TAKE 1 TABLET BY MOUTH EVERY DAY 90 tablet 1  . BYSTOLIC 10 MG tablet TAKE 1 TABLET BY MOUTH  EVERY DAY 30 tablet 8  . celecoxib (CELEBREX) 200 MG capsule Take 200 mg by mouth daily.     . Cholecalciferol (VITAMIN D3) 5000 UNITS TABS Take by mouth daily.    . Cyanocobalamin (VITAMIN B-12 PO) Take 2,500 mcg by mouth daily.    Marland Kitchen ezetimibe (ZETIA) 10 MG tablet Take 1 tablet (10 mg total) by mouth daily. 28 tablet 0  . fish oil-omega-3 fatty acids 1000 MG capsule Take 2 g by mouth daily.    Marland Kitchen glucosamine-chondroitin 500-400 MG tablet Take 1 tablet by mouth 3 (three) times daily.    . hydrochlorothiazide (MICROZIDE) 12.5 MG capsule Take 1 capsule by  mouth daily.    . niacin (SLO-NIACIN) 500 MG tablet Take 500 mg by mouth 2 (two) times daily with a meal.    . potassium chloride SA (K-DUR,KLOR-CON) 20 MEQ tablet Take 1 tablet (20 mEq total) by mouth daily. 90 tablet 0  . warfarin (COUMADIN) 4 MG tablet Take 4 mg by mouth daily.      No facility-administered medications prior to visit.      Allergies:   Lipitor [atorvastatin]; Penicillins; and Vytorin [ezetimibe-simvastatin]   Social History   Social History  . Marital status: Married    Spouse name: N/A  . Number of children: N/A  . Years of education: N/A   Occupational History  . [name]    Social History Main Topics  . Smoking status: Never Smoker  . Smokeless tobacco: Never Used  . Alcohol use 0.0 oz/week     Comment: wine 3-4 times a week  . Drug use: No  . Sexual activity: Not Asked   Other Topics Concern  . None   Social History Narrative  . None     Family History:  The patient's family history includes Diabetes in his brother and maternal grandmother; Emphysema in his unknown relative; Heart disease in his unknown relative; Hypertension in his brother; Stroke in his father and paternal grandfather.   ROS:   Please see the history of present illness.    ROS All other systems reviewed and are negative.   PHYSICAL EXAM:   VS:  BP (!) 163/96   Pulse 99   Ht 5\' 10"  (1.778 m)   Wt 202 lb 12.8 oz (92 kg)   BMI 29.10 kg/m    Recheck blood pressure 145/92 mm Hg General: Well nourished, well developed, Alert, oriented x3, no distress Head: no evidence of trauma, PERRL, EOMI, no exophtalmos or lid lag, no myxedema, no xanthelasma; normal ears, nose and oropharynx Neck: normal jugular venous pulsations and no hepatojugular reflux; brisk carotid pulses without delay and no carotid bruits Chest: clear to auscultation, no signs of consolidation by percussion or palpation, normal fremitus, symmetrical and full respiratory excursions Cardiovascular: normal position and  quality of the apical impulse, Paradoxically split second heart sound, RRR; no murmurs, rubs, or gallops,no edema ; healthy subclavian pacemaker site Abdomen: no tenderness or distention, no masses by palpation, no abnormal pulsatility or arterial bruits, normal bowel sounds, no hepatosplenomegaly Extremities: no clubbing, cyanosis or edema; 2+ radial, ulnar and brachial pulses bilaterally; 2+ right femoral, posterior tibial and dorsalis pedis pulses; 2+ left femoral, posterior tibial and dorsalis pedis pulses; no subclavian or femoral bruits Neurological: grossly nonfocal Psych: euthymic mood, full affect  Wt Readings from Last 3 Encounters:  08/08/16 202 lb 12.8 oz (92 kg)  03/08/16 208 lb (94.3 kg)  02/08/16 205 lb 9.6 oz (93.3 kg)     Studies/Labs  Reviewed:   EKG:  EKG is ordered today.  The ekg ordered today demonstrates atrial fibrillation, V paced 100%    Lipid Panel    Component Value Date/Time   CHOL 164 06/10/2012 0815   TRIG 94 06/10/2012 0815   HDL 64 06/10/2012 0815   CHOLHDL 2.6 06/10/2012 0815   VLDL 19 06/10/2012 0815   LDLCALC 81 06/10/2012 0815     ASSESSMENT:    1. Atrial fibrillation, permanent ([place])   2. Complete heart block ([place])   3. Pacemaker   4. Obstructive sleep apnea   5. Essential hypertension   6. Pure hypercholesterolemia      PLAN:  In order of problems listed above:  1. AFib: Permanent. On appropriate anticoagulation with warfarin. No embolic events, but elevated embolic risk: CHADSVasc 3-5 (age 42, HTN, questionable TIA in 2002). 2. CHB: Pacemaker dependent 3. PPM: Approaching ERI, doing monthly remote pacemaker downloads. Expect ERI in the next month or so. Would plan to do the generator change out on full anticoagulation, just try to keep the INR under 2.5. Due to the potential lead catastrophic implications of device pocket infection, will plan to use a antibiotic Tyrx pouch at the time of his procedure. 4. OSA: Reports good compliance  with CPAP 5. HTN: His blood pressure has recently been elevated despite good compliance with his medications. He is not convinced that has been really any increase in his sodium intake. Will increase the hydrochlorothiazide dose slightly. 6. HLP: With history of intolerance to statins. On a combination of ezetimibe and niacin.     Medication Adjustments/Labs and Tests Ordered: Current medicines are reviewed at length with the patient today.  Concerns regarding medicines are outlined above.  Medication changes, Labs and Tests ordered today are listed in the Patient Instructions below. There are no Patient Instructions on file for this visit.   Signed, Dean Klein, MD  08/08/2016 8:36 AM    [facility] [place], [place],   [address] Phone: [phone]; Fax: [phone]

## 2016-08-15 LAB — CUP PACEART INCLINIC DEVICE CHECK
Battery Remaining Longevity: 1 mo
Brady Statistic RV Percent Paced: 99.9 %
Implantable Lead Location: 753860
Lead Channel Impedance Value: 471 Ohm
Lead Channel Pacing Threshold Amplitude: 1 V
Lead Channel Setting Pacing Amplitude: 2.5 V
Lead Channel Setting Sensing Sensitivity: 2.8 mV
MDC IDC LEAD IMPLANT DT: 20090402
MDC IDC MSMT BATTERY IMPEDANCE: 6946 Ohm
MDC IDC MSMT BATTERY VOLTAGE: 2.63 V
MDC IDC MSMT LEADCHNL RV PACING THRESHOLD PULSEWIDTH: 0.4 ms
MDC IDC PG IMPLANT DT: 20090402
MDC IDC SESS DTM: 20180808134039
MDC IDC SET LEADCHNL RV PACING PULSEWIDTH: 0.4 ms

## 2016-08-30 DIAGNOSIS — I480 Paroxysmal atrial fibrillation: Secondary | ICD-10-CM | POA: Diagnosis not present

## 2016-08-30 DIAGNOSIS — Z7901 Long term (current) use of anticoagulants: Secondary | ICD-10-CM | POA: Diagnosis not present

## 2016-09-07 DIAGNOSIS — Z6831 Body mass index (BMI) 31.0-31.9, adult: Secondary | ICD-10-CM | POA: Diagnosis not present

## 2016-09-07 DIAGNOSIS — I10 Essential (primary) hypertension: Secondary | ICD-10-CM | POA: Diagnosis not present

## 2016-09-07 DIAGNOSIS — M79604 Pain in right leg: Secondary | ICD-10-CM | POA: Diagnosis not present

## 2016-09-07 DIAGNOSIS — Z7901 Long term (current) use of anticoagulants: Secondary | ICD-10-CM | POA: Diagnosis not present

## 2016-09-11 ENCOUNTER — Other Ambulatory Visit: Payer: Self-pay

## 2016-09-11 ENCOUNTER — Encounter: Payer: Medicare Other | Admitting: *Deleted

## 2016-09-11 DIAGNOSIS — Z45010 Encounter for checking and testing of cardiac pacemaker pulse generator [battery]: Secondary | ICD-10-CM

## 2016-09-13 ENCOUNTER — Telehealth: Payer: Self-pay | Admitting: Cardiovascular Disease

## 2016-09-13 NOTE — Telephone Encounter (Signed)
New message      Pt is due for a pacemaker changeout on the 19th.  He has a question regarding stopping his coumadin prior to the procedure.  Please call

## 2016-09-13 NOTE — Telephone Encounter (Signed)
Spoke with pt, he was not given any instructions regarding the warfarin when he was here. Will forward to dr croitoru to review and advise.

## 2016-09-13 NOTE — Telephone Encounter (Signed)
We will not stop anticoagulation, bleeding risk is low. Continue warfarin, but would check an INR 3-4 days before the procedure, just to make sure the INR is 2.5 or less.

## 2016-09-13 NOTE — Telephone Encounter (Signed)
Spoke with pt, aware of dr croitoru's recommendations. He has an INR ordered with his pre-procedure labs.

## 2016-09-20 DIAGNOSIS — I482 Chronic atrial fibrillation: Secondary | ICD-10-CM | POA: Diagnosis not present

## 2016-09-21 LAB — CBC
HEMATOCRIT: 43.4 % (ref 37.5–51.0)
HEMOGLOBIN: 14.9 g/dL (ref 13.0–17.7)
MCH: 31.2 pg (ref 26.6–33.0)
MCHC: 34.3 g/dL (ref 31.5–35.7)
MCV: 91 fL (ref 79–97)
Platelets: 156 10*3/uL (ref 150–379)
RBC: 4.77 x10E6/uL (ref 4.14–5.80)
RDW: 14.8 % (ref 12.3–15.4)
WBC: 8.6 10*3/uL (ref 3.4–10.8)

## 2016-09-21 LAB — BASIC METABOLIC PANEL
BUN/Creatinine Ratio: 19 (ref 10–24)
BUN: 20 mg/dL (ref 8–27)
CALCIUM: 8.9 mg/dL (ref 8.6–10.2)
CO2: 24 mmol/L (ref 20–29)
CREATININE: 1.03 mg/dL (ref 0.76–1.27)
Chloride: 104 mmol/L (ref 96–106)
GFR calc Af Amer: 78 mL/min/{1.73_m2} (ref >59)
GFR calc non Af Amer: 67 mL/min/{1.73_m2} (ref >59)
Glucose: 84 mg/dL (ref 65–99)
Potassium: 3.9 mmol/L (ref 3.5–5.2)
Sodium: 144 mmol/L (ref 134–144)

## 2016-09-21 LAB — PROTIME-INR
INR: 3.7 — ABNORMAL HIGH (ref 0.8–1.2)
PROTHROMBIN TIME: 35.3 s — AB (ref 9.1–12.0)

## 2016-09-24 ENCOUNTER — Telehealth: Payer: Self-pay | Admitting: Pharmacist

## 2016-09-24 NOTE — Telephone Encounter (Signed)
Talked to DR Quay Burow today. He takes his warfarin every morning and already had today's dose.    Instructed to HOLD warfarin tomorrow 9/18 and the morning of 9/19 (procedure). Okay to resume on 9/19 after procedure if okay by Dr Sallyanne Kuster

## 2016-09-24 NOTE — Telephone Encounter (Signed)
Left message on machine. Patient to call back for warfarin dose recommendations.

## 2016-09-24 NOTE — Telephone Encounter (Signed)
-----   Message from Sanda Klein, MD sent at 09/21/2016 10:34 AM EDT ----- Labs OK, but INR too high. Would like to get INR in 2.0-2.5 range by the time of his procedure.  Kristin and Len Azeez, we do not do his routine INR checks, but can you please advise him on dose changes?  Thank you MCr

## 2016-09-26 ENCOUNTER — Encounter (HOSPITAL_COMMUNITY)
Admission: RE | Disposition: A | Discharge status: Home / Self Care | Payer: Self-pay | Source: Ambulatory Visit | Attending: Cardiovascular Disease

## 2016-09-26 ENCOUNTER — Ambulatory Visit (HOSPITAL_COMMUNITY)
Admission: RE | Admit: 2016-09-26 | Discharge: 2016-09-26 | Disposition: A | Discharge status: Home / Self Care | Payer: Medicare Other | Source: Ambulatory Visit | Attending: Cardiovascular Disease | Admitting: Cardiovascular Disease

## 2016-09-26 DIAGNOSIS — G4733 Obstructive sleep apnea (adult) (pediatric): Secondary | ICD-10-CM | POA: Insufficient documentation

## 2016-09-26 DIAGNOSIS — Z7982 Long term (current) use of aspirin: Secondary | ICD-10-CM | POA: Insufficient documentation

## 2016-09-26 DIAGNOSIS — I10 Essential (primary) hypertension: Secondary | ICD-10-CM | POA: Diagnosis not present

## 2016-09-26 DIAGNOSIS — M1990 Unspecified osteoarthritis, unspecified site: Secondary | ICD-10-CM | POA: Insufficient documentation

## 2016-09-26 DIAGNOSIS — Z45010 Encounter for checking and testing of cardiac pacemaker pulse generator [battery]: Secondary | ICD-10-CM | POA: Diagnosis not present

## 2016-09-26 DIAGNOSIS — I442 Atrioventricular block, complete: Secondary | ICD-10-CM | POA: Diagnosis not present

## 2016-09-26 DIAGNOSIS — Z7901 Long term (current) use of anticoagulants: Secondary | ICD-10-CM | POA: Insufficient documentation

## 2016-09-26 DIAGNOSIS — Z880 Allergy status to penicillin: Secondary | ICD-10-CM | POA: Diagnosis not present

## 2016-09-26 DIAGNOSIS — I482 Chronic atrial fibrillation: Secondary | ICD-10-CM | POA: Diagnosis not present

## 2016-09-26 DIAGNOSIS — E785 Hyperlipidemia, unspecified: Secondary | ICD-10-CM | POA: Insufficient documentation

## 2016-09-26 DIAGNOSIS — I4821 Permanent atrial fibrillation: Secondary | ICD-10-CM

## 2016-09-26 HISTORY — PX: PPM GENERATOR CHANGEOUT: EP1233

## 2016-09-26 LAB — SURGICAL PCR SCREEN
MRSA, PCR: NEGATIVE
STAPHYLOCOCCUS AUREUS: POSITIVE — AB

## 2016-09-26 LAB — PROTIME-INR
INR: 2.07
Prothrombin Time: 23.1 seconds — ABNORMAL HIGH (ref 11.4–15.2)

## 2016-09-26 SURGERY — PPM GENERATOR CHANGEOUT

## 2016-09-26 MED ORDER — HEPARIN (PORCINE) IN NACL 2-0.9 UNIT/ML-% IJ SOLN
INTRAMUSCULAR | Status: AC | PRN
  Administered 2016-09-26: 500 mL

## 2016-09-26 MED ORDER — ONDANSETRON HCL 4 MG/2ML IJ SOLN
4.0000 mg | Freq: Four times a day (QID) | INTRAMUSCULAR | Status: DC | PRN
Start: 2016-09-26 — End: 2016-09-26

## 2016-09-26 MED ORDER — MUPIROCIN 2 % EX OINT
1.0000 "application " | TOPICAL_OINTMENT | Freq: Once | CUTANEOUS | Status: AC
  Administered 2016-09-26: 1 via TOPICAL

## 2016-09-26 MED ORDER — MIDAZOLAM HCL 5 MG/5ML IJ SOLN
INTRAMUSCULAR | Status: AC
  Filled 2016-09-26: qty 5

## 2016-09-26 MED ORDER — GENTAMICIN SULFATE 40 MG/ML IJ SOLN
INTRAMUSCULAR | Status: AC
  Filled 2016-09-26: qty 2

## 2016-09-26 MED ORDER — VANCOMYCIN HCL IN DEXTROSE 1-5 GM/200ML-% IV SOLN
INTRAVENOUS | Status: AC
  Filled 2016-09-26: qty 200

## 2016-09-26 MED ORDER — FENTANYL CITRATE (PF) 100 MCG/2ML IJ SOLN
INTRAMUSCULAR | Status: AC
  Filled 2016-09-26: qty 2

## 2016-09-26 MED ORDER — LIDOCAINE HCL (PF) 1 % IJ SOLN
INTRAMUSCULAR | Status: AC
  Filled 2016-09-26: qty 60

## 2016-09-26 MED ORDER — MUPIROCIN 2 % EX OINT
TOPICAL_OINTMENT | CUTANEOUS | Status: AC
  Administered 2016-09-26: 1 via TOPICAL
  Filled 2016-09-26: qty 22

## 2016-09-26 MED ORDER — CHLORHEXIDINE GLUCONATE 4 % EX LIQD
60.0000 mL | Freq: Once | CUTANEOUS | Status: DC
  Filled 2016-09-26: qty 60

## 2016-09-26 MED ORDER — VANCOMYCIN HCL IN DEXTROSE 1-5 GM/200ML-% IV SOLN
1000.0000 mg | INTRAVENOUS | Status: AC
Start: 2016-09-26 — End: 2016-09-26
  Administered 2016-09-26: 1000 mg via INTRAVENOUS

## 2016-09-26 MED ORDER — SODIUM CHLORIDE 0.9 % IV SOLN
INTRAVENOUS | Status: DC
  Administered 2016-09-26: 14:00:00 via INTRAVENOUS

## 2016-09-26 MED ORDER — LIDOCAINE HCL (PF) 1 % IJ SOLN
INTRAMUSCULAR | Status: DC | PRN
  Administered 2016-09-26: 30 mL

## 2016-09-26 MED ORDER — SODIUM CHLORIDE 0.9 % IR SOLN
80.0000 mg | Status: AC
  Administered 2016-09-26: 80 mg

## 2016-09-26 MED ORDER — ACETAMINOPHEN 325 MG PO TABS
325.0000 mg | ORAL_TABLET | ORAL | Status: DC | PRN
  Filled 2016-09-26: qty 2

## 2016-09-26 SURGICAL SUPPLY — 5 items
CABLE SURGICAL S-101-97-12 (CABLE) ×2 IMPLANT
IPG PACE AZUR XT SR MRI W1SR01 (Pacemaker) IMPLANT
PACE AZURE XT SR MRI W1SR01 (Pacemaker) ×3 IMPLANT
PAD DEFIB LIFELINK (PAD) ×2 IMPLANT
TRAY PACEMAKER INSERTION (PACKS) ×2 IMPLANT

## 2016-09-26 NOTE — Discharge Instructions (Signed)

## 2016-09-26 NOTE — H&P (Signed)
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 Quay Burow has lost any permanent atrial fibrillation complete heart block as well as obstructive sleep apnea, hypertension and hyperlipidemia but without known cardiac structural abnormalities or coronary disease. He has a single chamber Medtronic Adapta pacemaker implanted in 2009 which has reached elective replacement indicator. Lead parameters remain excellent. He is pacemaker dependent.  He is on chronic warfarin anticoagulation. On September 13 his INR was elevated at 3.7. Warfarin held for the last 2 days. INR pending today.  Past Medical History:  Diagnosis Date  . Arthritis   . Atrial fibrillation ([place])   . Dysrhythmia    AF  . Hyperlipemia   . Hypertension   . Pacemaker   . Pacemaker   . Sinus bradycardia seen on cardiac monitor   . Sleep apnea    does use his cpap  . Wears glasses   . Wears hearing aid     Past Surgical History:  Procedure Laterality Date  . CARDIOVERSION     multiple times-pacer put in 2009  . COLONOSCOPY    . I&D EXTREMITY Left 04/20/2013   Procedure: LEFT HAND WOUND DEBRIDEMENT AND CLOSURE ;  Surgeon: Jolyn Nap, MD;  Location: [place];  Service: Orthopedics;  Laterality: Left;  . KNEE ARTHROSCOPY  2/10   left  . MASS EXCISION  01/29/2012   Procedure: EXCISION MASS;  Surgeon: Hessie Dibble, MD;  Location: [place];  Service: Orthopedics;  Laterality: Left;  excision of bone spur left hand  . METACARPAL OSTEOTOMY Left 01/13/2013     Procedure: LEFT INDEX FINGER MCP RADIAL COLLATERAL REPAIR/RECONSTRUCTION VS METACARPAL ROTATIONAL OSTEOTOMY;  Surgeon: Jolyn Nap, MD;  Location: [place];  Service: Orthopedics;  Laterality: Left;  . PACEMAKER INSERTION  2009  . SHOULDER ARTHROSCOPY  2004   left  . THUMB ARTHROSCOPY  2007   rt thunb mass  . TONSILLECTOMY       Home Medications:  Prior to Admission medications   Medication Sig Start Date End Date Taking? Authorizing Provider  amLODipine (NORVASC) 5 MG tablet Take 5 mg by mouth daily.  01/20/15  Yes [provider]  Arginine 1000 MG TABS Take 1,000 mg by mouth daily.    Yes [provider]  aspirin EC 81 MG tablet Take 81 mg by mouth at bedtime.   Yes [provider]  AZOR 10-40 MG tablet TAKE 1 TABLET BY MOUTH EVERY DAY 06/15/15  Yes Jayln Branscom, MD  BYSTOLIC 10 MG tablet TAKE 1 TABLET BY MOUTH EVERY DAY Patient taking differently: TAKE 1 TABLET BY MOUTH EVERY DAY AT BEDTIME 12/16/15  Yes Alysen Smylie, MD  celecoxib (CELEBREX) 200 MG capsule Take 200 mg by mouth daily with breakfast.  01/09/13  Yes [provider]  Cholecalciferol (VITAMIN D3) 5000 UNITS TABS Take 5,000 Units by mouth daily.    Yes [provider]  ezetimibe (ZETIA) 10 MG tablet Take 1 tablet (10 mg total) by mouth daily. Patient taking differently: Take 10 mg by mouth daily with  breakfast.  08/04/13  Yes Nelsy Madonna, MD  fish oil-omega-3 fatty acids 1000 MG capsule Take 1 g by mouth 2 (two) times daily.    Yes [provider]  Glucosamine-Chondroitin (COSAMIN DS PO) Take by mouth daily with breakfast.   Yes [provider]  hydrochlorothiazide (MICROZIDE) 12.5 MG capsule Take 1-2 capsules (12.5-25 mg total) by mouth daily as directed. Patient taking differently: Take 12.5-25 mg by mouth See admin instructions. TAKE 2 CAPSULES (25 MG) ON Monday, Wednesday, AND Friday & TAKE 1 CAPSULE ON ALL OTHER  DAYS OF THE WEEK.  08/08/16  Yes Ruqaya Strauss, MD  ibuprofen (ADVIL,MOTRIN) 200 MG tablet Take 400 mg by mouth every 8 (eight) hours as needed (for pain.).   Yes [provider]  niacin (SLO-NIACIN) 500 MG tablet Take 500 mg by mouth 2 (two) times daily with a meal.   Yes [provider]  olmesartan (BENICAR) 40 MG tablet Take 40 mg by mouth daily with breakfast.   Yes [provider]  potassium chloride SA (K-DUR,KLOR-CON) 20 MEQ tablet Take 1 tablet (20 mEq total) by mouth daily. 05/11/16  Yes Jasma Seevers, MD  warfarin (COUMADIN) 4 MG tablet Take 4 mg by mouth daily with breakfast.    Yes [provider]    Inpatient Medications: Scheduled Meds:  Continuous Infusions:  PRN Meds:   Allergies:    Allergies  Allergen Reactions  . Lipitor [Atorvastatin] Other (See Comments)    Muscle pain  . Penicillins Rash    Has patient had a PCN reaction causing immediate rash, facial/tongue/throat swelling, SOB or lightheadedness with hypotension: Unknown Has patient had a PCN reaction causing severe rash involving mucus membranes or skin necrosis: Unknown Has patient had a PCN reaction that required hospitalization: No Has patient had a PCN reaction occurring within the last 10 years: No If all of the above answers are "NO", then may proceed with Cephalosporin use.   . Vytorin [Ezetimibe-Simvastatin] Other (See Comments)    Muscle pain    Social History:   Social History   Social History  . Marital status: Married    Spouse name: N/A  . Number of children: N/A  . Years of education: N/A   Occupational History  . [name]    Social History Main Topics  . Smoking status: Never Smoker  . Smokeless tobacco: Never Used  . Alcohol use 0.0 oz/week     Comment: wine 3-4 times a week  . Drug use: No  . Sexual activity: Not on file   Other Topics Concern  . Not on file   Social History Narrative  . No narrative on file    Family History:    Family History  Problem  Relation Age of Onset  . Stroke Father   . Hypertension Brother   . Diabetes Brother   . Diabetes Maternal Grandmother   . Stroke Paternal Grandfather   . Emphysema Unknown   . Heart disease Unknown      ROS:  Please see the history of present illness.  ROS  All other ROS reviewed and negative.     Physical Exam/Data:   Vitals:   09/26/16 1148  BP: (!) (P) 158/87  Pulse: (P) 65  Resp: (P) 18  Temp: (P) 97.8 F (36.6 C)  TempSrc: (P) Oral  SpO2: (P) 100%  Weight: (P) 185 lb (83.9 kg)  Height: (P) 5\' 11"  (1.803 m)   No intake or output data in the 24 hours ending 09/26/16  [place]   09/26/16 1148  Weight: (P) 185 lb (83.9 kg)   Body mass index is 25.8 kg/m (pended).  General:  Well nourished, well developed, in no acute distress HEENT: normal Lymph: no adenopathy Neck: no JVD Endocrine:  No thryomegaly Vascular: No carotid bruits; FA pulses 2+ bilaterally without bruits  Cardiac:  normal S1, S2; RRR; no murmur  Lungs:  clear to auscultation bilaterally, no wheezing, rhonchi or rales . Right subclavian pacemaker site looks healthy. Abd: soft, nontender, no hepatomegaly  Ext: no edema Musculoskeletal:  No deformities, BUE and BLE strength normal and equal Skin: warm and dry  Neuro:  CNs 2-12 intact, no focal abnormalities noted Psych:  Normal affect   EKG:  The EKG from August 1 was personally reviewed and demonstrates:  Atrial fibrillation with 100% ventricular pacing Telemetry:  Telemetry was personally reviewed and demonstrates:  Ventricular paced rhythm   Laboratory Data:  Chemistry Recent Labs Lab 09/20/16 0842  NA 144  K 3.9  CL 104  CO2 24  GLUCOSE 84  BUN 20  CREATININE 1.03  CALCIUM 8.9  GFRNONAA 67  GFRAA 78    No results for input(s): PROT, ALBUMIN, AST, ALT, ALKPHOS, BILITOT in the last 168 hours. Hematology Recent Labs Lab 09/20/16 0842  WBC 8.6  RBC 4.77  HGB 14.9  HCT 43.4  MCV 91  MCH 31.2  MCHC 34.3  RDW 14.8    PLT 156    Assessment and Plan:   1. Complete heart block: Pacemaker dependent 2. Atrial fibrillation: Permanent arrhythmia, asymptomatic. 3. Anticoagulation: Plan to perform the procedure without interrupting warfarin anticoagulation, acknowledge increased risk of bleeding/pocket hematoma. 4. Pacemaker at ERI: As long as a level of anticoagulation is not prohibitive, plan pacemaker generator change out today. This procedure has been fully reviewed with the patient and written informed consent has been obtained. 5. HTN: hydrochlorothiazide increased at his last office appointment in August. Systolic blood pressure still high today, we'll recheck throughout the day to see if further medication adjustments are necessary.   For questions or updates, please contact [place] Please consult [email or web address] for contact info under Cardiology/STEMI.   Signed, Sanda Klein, MD  09/26/2016 1:12 PM

## 2016-09-26 NOTE — Brief Op Note (Signed)
  Procedure report  Procedure performed:  Single chamber pacemaker generator changeout    Reason for procedure:  1. Device generator at elective replacement interval  2. Complete heart block 3. Paroxysmal atrial fibrillation  Procedure performed by:  Sanda Klein, MD  Complications:  None  Estimated blood loss:  <5 mL  Medications administered during procedure:  Vancomycin 1 g intravenously, lidocaine 1% 30 mL locally  Device details:   [employer] model number [id number], serial number [id number] H Right ventricular lead (chronic)  Medtronic [id number], serial number [id number] (implanted 04/10/2007)  Explanted generator Medtronic Adapta [id number] (implanted 04/10/2007)  Procedure details:  After the risks and benefits of the procedure were discussed the patient provided informed consent. She was brought to the cardiac catheter lab in the fasting state. The patient was prepped and draped in usual sterile fashion. Local anesthesia with 1% lidocaine was administered to to the left infraclavicular area. A 5-6cm horizontal incision was made parallel with and 2-3 cm caudal to the right clavicle, in the area of an old scar.  Using sharp and blunt dissection the prepectoral pocket was opened carefully to avoid injury to the loops of chronic lead. Extensive dissection was not necessary. The device was explanted. The pocket was carefully inspected for hemostasis and flushed with copious amounts of antibiotic solution.  The lead was disconnected from the old generator and testing of the lead parameters later showed excellent values. The new generator was connected to the chronic lead, with appropriate pacing noted.   The entire system was then carefully inserted in the pocket with care been taking that the leads and device assumed a comfortable position without pressure on the incision. Great care was taken that the leads be located deep to the generator. The pocket was then closed in  layers using 2 layers of 2-0 Vicryl and cutaneous staples after which a sterile dressing was applied.   At the end of the procedure the following lead parameters were encountered:  No sensed R waves, impedance 400 ohms, threshold 1.0 at 0.4 ms pulse width.  Sanda Klein, MD, [facility] [phone] office [phone] pager

## 2016-09-27 ENCOUNTER — Encounter (HOSPITAL_COMMUNITY): Payer: Self-pay | Admitting: Cardiovascular Disease

## 2016-10-09 ENCOUNTER — Ambulatory Visit (INDEPENDENT_AMBULATORY_CARE_PROVIDER_SITE_OTHER): Payer: Medicare Other | Admitting: *Deleted

## 2016-10-09 DIAGNOSIS — I442 Atrioventricular block, complete: Secondary | ICD-10-CM | POA: Diagnosis not present

## 2016-10-09 LAB — CUP PACEART INCLINIC DEVICE CHECK
Battery Remaining Longevity: 140 mo
Battery Voltage: 3.19 V
Brady Statistic RV Percent Paced: 99.84 %
Implantable Lead Location: 753860
Implantable Pulse Generator Implant Date: 20180919
Lead Channel Impedance Value: 323 Ohm
Lead Channel Impedance Value: 361 Ohm
Lead Channel Sensing Intrinsic Amplitude: 15.5 mV
Lead Channel Setting Pacing Amplitude: 2.5 V
Lead Channel Setting Pacing Pulse Width: 0.4 ms
Lead Channel Setting Sensing Sensitivity: 4 mV
MDC IDC LEAD IMPLANT DT: 20090402
MDC IDC MSMT LEADCHNL RV PACING THRESHOLD AMPLITUDE: 1 V
MDC IDC MSMT LEADCHNL RV PACING THRESHOLD PULSEWIDTH: 0.4 ms
MDC IDC MSMT LEADCHNL RV SENSING INTR AMPL: 15.5 mV
MDC IDC SESS DTM: 20181002100338

## 2016-10-09 NOTE — Progress Notes (Signed)
Wound check appointment. Staples removed. Wound without redness or edema. Incision edges approximated, wound well healed. Normal device function. Threshold and impedances consistent with implant measurements. Device programmed at chronic output values. Histogram distribution appropriate for patient and level of activity. No high ventricular rates noted. Patient educated about wound care, arm mobility. Msg sent to scheduler for 3 month f/u with [facility]

## 2016-10-15 ENCOUNTER — Telehealth: Payer: Self-pay | Admitting: Cardiovascular Disease

## 2016-10-15 MED ORDER — NEBIVOLOL HCL 20 MG PO TABS: 20.0000 mg | ORAL_TABLET | Freq: Every day | ORAL | 1 refills | Status: DC

## 2016-10-15 NOTE — Telephone Encounter (Signed)
Increase Bystolic to 20 mg once daily. Call  back with recorded blood pressures in about a week. Thanks [employer]

## 2016-10-15 NOTE — Telephone Encounter (Signed)
Pt advised on change recommendations as well as instruction to call back in 1 week w report of BP trends. He verbalized understanding of instructions and thanks. Rx called to his preferred local pharmacy in [place] for pickup.

## 2016-10-15 NOTE — Telephone Encounter (Signed)
Pt of Dr. Sallyanne Kuster  Pt notes elevated readings for the last few days. Asking to tweak medications to get BP under control.  readings: 198/107 180/96 194/99, "that type of thing"   Doesn't know HRs offhand.  Denies headache, slurred speech, lightheadedness, dizziness, etc.  Confirmed med list, pt reports NO CHANGES to how he has been taking medications, and denies missed doses.  Pt indicates he needs to be somewhere by 1pm today, requests adjustment to medication prior to that. [place] and will give me name of pharmacy to send to when I call back.  Aware I will route for MD review.

## 2016-10-15 NOTE — Telephone Encounter (Signed)
New message    Pt is calling asking for a call back.   Pt c/o BP issue: STAT if pt c/o blurred vision, one-sided weakness or slurred speech  1. What are your last 5 BP readings? 198/107, 180/96-this morning  2. Are you having any other symptoms (ex. Dizziness, headache, blurred vision, passed out)? No, ringing in ears  3. What is your BP issue? Pt BP is running high.

## 2016-10-19 ENCOUNTER — Telehealth: Payer: Self-pay | Admitting: Cardiovascular Disease

## 2016-10-19 DIAGNOSIS — I10 Essential (primary) hypertension: Secondary | ICD-10-CM

## 2016-10-19 NOTE — Telephone Encounter (Signed)
Returned call to patient of Dr. Loletha Grayer. He reports his BP has been elevated all week. He called in on 10/8 and was advised to increase bystolic to 20mg  daily. Verified meds with patient - he is now on hctz 25mg  daily (med list states 25mg  MWF, 12.5mg  all other days) in addition to bystolic 20mg  & azor 10/40mg  daily.  BP 174/103 and HR 78  BP 193/113  BP 195/91 and HR 62  Patient c/o ringing in his ears, denies headache/blurred vision.   Advised patient would defer to MD and pharmacy staff for advice on BP management and would follow up with him

## 2016-10-19 NOTE — Telephone Encounter (Signed)
I think we need to schedule him for renal artery ultrasound to make sure he doesn't have renal artery stenosis. Can increase the Bystolic to 40 mg daily, but he should be seen back in the office next week. Maybe we can get him into the hypertension clinic with our pharmacy staff? Please have him bring his bottles of medications in so that we know for sure where on the same page MCr

## 2016-10-19 NOTE — Telephone Encounter (Signed)
Called patient with MD recommendations. He agrees w/plan for renal art U/S (ordered entered & staff message sent to F. Clark) and increasing bystolic to 40mg  and f/up (scheduled w/L. Kilroy PA on 10/17 @ 11am).

## 2016-10-19 NOTE — Telephone Encounter (Signed)
New message    Pt c/o BP issue: STAT if pt c/o blurred vision, one-sided weakness or slurred speech  1. What are your last 5 BP readings? 174/103 pulse 78  195/91 pulse 62   2. Are you having any other symptoms (ex. Dizziness, headache, blurred vision, passed out)?  Ringing in his ears  3. What is your BP issue? Slightly elevated please call

## 2016-10-24 ENCOUNTER — Ambulatory Visit (INDEPENDENT_AMBULATORY_CARE_PROVIDER_SITE_OTHER): Payer: Medicare Other | Admitting: Cardiology

## 2016-10-24 ENCOUNTER — Other Ambulatory Visit: Payer: Self-pay | Admitting: Cardiovascular Disease

## 2016-10-24 ENCOUNTER — Encounter: Payer: Self-pay | Admitting: Cardiology

## 2016-10-24 VITALS — BP 168/110 | HR 80 | Ht 70.0 in | Wt 201.0 lb

## 2016-10-24 DIAGNOSIS — Z950 Presence of cardiac pacemaker: Secondary | ICD-10-CM | POA: Diagnosis not present

## 2016-10-24 DIAGNOSIS — I4821 Permanent atrial fibrillation: Secondary | ICD-10-CM

## 2016-10-24 DIAGNOSIS — I482 Chronic atrial fibrillation: Secondary | ICD-10-CM | POA: Diagnosis not present

## 2016-10-24 DIAGNOSIS — Z7901 Long term (current) use of anticoagulants: Secondary | ICD-10-CM | POA: Diagnosis not present

## 2016-10-24 DIAGNOSIS — I10 Essential (primary) hypertension: Secondary | ICD-10-CM

## 2016-10-24 DIAGNOSIS — I480 Paroxysmal atrial fibrillation: Secondary | ICD-10-CM | POA: Diagnosis not present

## 2016-10-24 MED ORDER — NEBIVOLOL HCL 20 MG PO TABS: 40.0000 mg | ORAL_TABLET | Freq: Every day | ORAL | 6 refills | Status: DC

## 2016-10-24 MED ORDER — CHLORTHALIDONE 25 MG PO TABS: 25.0000 mg | ORAL_TABLET | Freq: Every day | ORAL | 6 refills | Status: DC

## 2016-10-24 NOTE — Patient Instructions (Addendum)
Medication Instructions:  INCREASE Bystolic to 40mg  Take 2 tablets once a day  STOP Hydrochlorthiazide START Chlorthalidone 25 mg Take 1 tablet once a day  Labwork: Your physician recommends that you return for lab work in: 11/12/2016 the same day as ultrasound-BMP  Testing/Procedures: None   Follow-Up: Your physician recommends that you schedule a follow-up appointment in: 1 month with pharmacist for Blood Pressure Check  Any Other Special Instructions Will Be Listed Below (If Applicable).  If you need a refill on your cardiac medications before your next appointment, please call your pharmacy.

## 2016-10-24 NOTE — Assessment & Plan Note (Addendum)
Paced rhythm, on Coumadin

## 2016-10-24 NOTE — Assessment & Plan Note (Signed)
Pt's B/P poorly controlled- this is relatively new.

## 2016-10-24 NOTE — Assessment & Plan Note (Addendum)
Pacemaker dependent. Single-chamber Medtronic Adapta ADD SR 1 implanted April 2009 for CHB. Gen change 09/26/16

## 2016-10-24 NOTE — Progress Notes (Signed)
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 male with complete heart block and permanent atrial fibrillation who had a MDT pacemaker in '09 and recently a Gen change Sept 2018 for battery EOL. He also has obstructive sleep apnea, hypertension and hyperlipidemia but does not have known coronary disease or other structural heart problems. He recently has noted an increase in his B/P. Dr Sallyanne Kuster suggested he increase his Bystolic to 40 mg daily but apparently the pt never did this. The pt is on my schedule today for follow up. His B/P remains elevated- 164/98 by me. His only complaint is Rt leg pain-"ache".    Current Outpatient Prescriptions  Medication Sig Dispense Refill  . Arginine 1000 MG TABS Take 1,000 mg by mouth daily.     Marland Kitchen aspirin EC 81 MG tablet Take 81 mg by mouth at bedtime.    . AZOR 10-40 MG tablet TAKE 1 TABLET BY MOUTH EVERY DAY 90 tablet 1  . celecoxib (CELEBREX) 200 MG capsule Take 200 mg by mouth daily with breakfast.     . Cholecalciferol (VITAMIN D3) 5000 UNITS TABS Take 5,000 Units by mouth daily.     Marland Kitchen ezetimibe (ZETIA) 10 MG tablet Take 1 tablet (10 mg total) by mouth daily. (Patient taking differently: Take 10 mg by mouth daily with breakfast. ) 28 tablet 0  . fish oil-omega-3 fatty acids 1000 MG capsule Take 1 g by mouth 2 (two) times daily.     . Glucosamine-Chondroitin (COSAMIN DS PO) Take by mouth daily with breakfast.    . ibuprofen (ADVIL,MOTRIN) 200 MG tablet Take 400 mg by mouth every 8 (eight) hours as needed (for pain.).    Marland Kitchen niacin (SLO-NIACIN) 500 MG tablet Take 500 mg by mouth 2 (two) times daily with a meal.    . potassium chloride SA (K-DUR,KLOR-CON) 20 MEQ tablet Take 1 tablet (20 mEq total) by mouth daily. 90 tablet 0  . warfarin (COUMADIN) 4 MG tablet Take 4 mg by mouth daily with breakfast.     . chlorthalidone  (HYGROTON) 25 MG tablet Take 1 tablet (25 mg total) by mouth daily. 30 tablet 6  . Nebivolol HCl 20 MG TABS Take 2 tablets (40 mg total) by mouth daily. 30 tablet 6   No current facility-administered medications for this visit.     Allergies  Allergen Reactions  . Lipitor [Atorvastatin] Other (See Comments)    Muscle pain  . Penicillins Rash    Has patient had a PCN reaction causing immediate rash, facial/tongue/throat swelling, SOB or lightheadedness with hypotension: Unknown Has patient had a PCN reaction causing severe rash involving mucus membranes or skin necrosis: Unknown Has patient had a PCN reaction that required hospitalization: No Has patient had a PCN reaction occurring within the last 10 years: No If all of the above answers are "NO", then may proceed with Cephalosporin use.   . Vytorin [Ezetimibe-Simvastatin] Other (See Comments)    Muscle pain    Past Medical History:  Diagnosis Date  . Arthritis   . Atrial fibrillation ([place])   . Dysrhythmia    AF  . Hyperlipemia   . Hypertension   . Pacemaker   . Pacemaker   . Sinus bradycardia seen on cardiac monitor   . Sleep apnea    does use his cpap  . Wears glasses   . Wears  hearing aid     Social History   Social History  . Marital status: Married    Spouse name: N/A  . Number of children: N/A  . Years of education: N/A   Occupational History  . [name]    Social History Main Topics  . Smoking status: Never Smoker  . Smokeless tobacco: Never Used  . Alcohol use 0.0 oz/week     Comment: wine 3-4 times a week  . Drug use: No  . Sexual activity: Not on file   Other Topics Concern  . Not on file   Social History Narrative  . No narrative on file     Family History  Problem Relation Age of Onset  . Stroke Father   . Hypertension Brother   . Diabetes Brother   . Diabetes Maternal Grandmother   . Stroke Paternal Grandfather   . Emphysema Unknown   . Heart disease Unknown      Review of  Systems: General: negative for chills, fever, night sweats or weight changes.  Cardiovascular: negative for chest pain, dyspnea on exertion, edema, orthopnea, palpitations, paroxysmal nocturnal dyspnea or shortness of breath Dermatological: negative for rash Respiratory: negative for cough or wheezing Urologic: negative for hematuria Abdominal: negative for nausea, vomiting, diarrhea, bright red blood per rectum, melena, or hematemesis Neurologic: negative for visual changes, syncope, or dizziness All other systems reviewed and are otherwise negative except as noted above.    Blood pressure (!) 168/110, pulse 80, height 5\' 10"  (1.778 m), weight 201 lb (91.2 kg).  General appearance: alert, cooperative and no distress Lungs: clear to auscultation bilaterally Heart: regular rate and rhythm Extremities: extremities normal, atraumatic, no cyanosis or edema Pulses: 2-3/4 DP pulse RLE Skin: Skin color, texture, turgor normal. No rashes or lesions Neurologic: Grossly normal   ASSESSMENT AND PLAN:   Uncontrolled hypertension Pt's B/P poorly controlled- this is relatively new.  Atrial fibrillation, permanent ([facility]) Paced rhythm, on Coumadin  Pacemaker Pacemaker dependent. Single-chamber Medtronic Adapta ADD SR 1 implanted April 2009 for CHB. Gen change 09/26/16   PLAN  He is scheduled for renal dopplers. I spoke with our pharmacist in the office. We will change his HCTZ 12.5 daily to chlorthalidone 25 mg daily and he will increase his Bystolic to 40 mg. We'll check a BMP when he comes in for his renal artery dopplers and then he'll f/u with a pharmacist for further adjustment of his medications if needed. He knows to avoid anti inflammatories.   I reassured him him his Rt leg pain is most likely not a vascular problem. He has bounding distal pulses and he gave no history of claudication. He is chronically anticoagulated making a DVT unlikely.   Kerin Ransom PA-C 10/24/2016 12:54 PM

## 2016-11-12 ENCOUNTER — Ambulatory Visit (HOSPITAL_COMMUNITY)
Admission: RE | Admit: 2016-11-12 | Discharge: 2016-11-12 | Disposition: A | Discharge status: Home / Self Care | Payer: Medicare Other | Source: Ambulatory Visit | Attending: Cardiovascular Disease | Admitting: Cardiovascular Disease

## 2016-11-12 DIAGNOSIS — I10 Essential (primary) hypertension: Secondary | ICD-10-CM

## 2016-11-13 LAB — BASIC METABOLIC PANEL
BUN/Creatinine Ratio: 23 (ref 10–24)
BUN: 25 mg/dL (ref 8–27)
CO2: 25 mmol/L (ref 20–29)
Calcium: 9.6 mg/dL (ref 8.6–10.2)
Chloride: 102 mmol/L (ref 96–106)
Creatinine, Ser: 1.09 mg/dL (ref 0.76–1.27)
GFR calc Af Amer: 72 mL/min/{1.73_m2} (ref >59)
GFR calc non Af Amer: 62 mL/min/{1.73_m2} (ref >59)
Glucose: 97 mg/dL (ref 65–99)
Potassium: 3.8 mmol/L (ref 3.5–5.2)
Sodium: 144 mmol/L (ref 134–144)

## 2016-11-14 ENCOUNTER — Telehealth: Payer: Self-pay | Admitting: *Deleted

## 2016-11-14 NOTE — Telephone Encounter (Signed)
Left a message for the patient to call back.  

## 2016-11-14 NOTE — Telephone Encounter (Signed)
-----   Message from Erlene Quan, [place] sent at 11/14/2016  7:52 AM EST ----- Please let the pt know his lab work looked good.  Kerin Ransom PA-C 11/14/2016 7:52 AM

## 2016-11-19 ENCOUNTER — Telehealth: Payer: Self-pay | Admitting: Cardiovascular Disease

## 2016-11-19 NOTE — Telephone Encounter (Signed)
New message  Pt verbalized that he is returning call for the rn   Please call pt in the next 30 min

## 2016-11-19 NOTE — Telephone Encounter (Signed)
Returned the call to the patient. He was calling for results.  Notes recorded by Sanda Klein, MD on 11/13/2016 at 8:32 AM EST Normal kidney arteries, no problems there to explain the increase in BP. How is he feeling after the latest change in meds?  The patient verbalized his understanding. He stated that he feels much better and that his blood pressures have been better controlled.

## 2016-11-19 NOTE — Telephone Encounter (Signed)
Dr Quay Burow wants somebody to call him asap please.

## 2016-11-22 DIAGNOSIS — I480 Paroxysmal atrial fibrillation: Secondary | ICD-10-CM | POA: Diagnosis not present

## 2016-11-22 DIAGNOSIS — Z7901 Long term (current) use of anticoagulants: Secondary | ICD-10-CM | POA: Diagnosis not present

## 2016-11-26 ENCOUNTER — Telehealth: Payer: Self-pay | Admitting: Cardiovascular Disease

## 2016-11-26 ENCOUNTER — Ambulatory Visit: Payer: Medicare Other

## 2016-11-26 NOTE — Telephone Encounter (Signed)
Returned the call to the patient. He stated that he was calling to report his blood pressures and to state that he was feeling much better now. He did state that he has periods of feeling light headed but it is rare. His blood pressures today, after his medication, were:  118/72  HR 78 106/64  HR 62  Will route to the provider for his knowledge.

## 2016-11-26 NOTE — Telephone Encounter (Signed)
New message   Patient calling to report BP.   Pt c/o BP issue: STAT if pt c/o blurred vision, one-sided weakness or slurred speech  1. What are your last 5 BP readings? 118/72, 106/64  2. Are you having any other symptoms (ex. Dizziness, headache, blurred vision, passed out)? Gets lightheaded  at times  3. What is your BP issue? Calling to report BP only

## 2016-11-26 NOTE — Telephone Encounter (Signed)
Thank you for my knowledge MCr

## 2016-11-28 ENCOUNTER — Telehealth: Payer: Self-pay | Admitting: Cardiovascular Disease

## 2016-11-28 NOTE — Telephone Encounter (Signed)
New Message     Pt c/o BP issue: STAT if pt c/o blurred vision, one-sided weakness or slurred speech  1. What are your last 5 BP readings?  11/28/16 118/71 78 before meds  100/63 63 after meds   2. Are you having any other symptoms (ex. Dizziness, headache, blurred vision, passed out)?  No energy  3. What is your BP issue?  Was to let Dr C know what the readings are

## 2016-11-28 NOTE — Telephone Encounter (Signed)
Returned call to patient.   He called in on 11/19 with BP readings that were forwarded to MD and no med changes were made - similar BP/HR readings to what was provided today. Advised that MD did not recommend med changes on 11/19, so he should continue current meds. He voiced understanding.   Routed to MD as Juluis Rainier

## 2016-12-14 ENCOUNTER — Other Ambulatory Visit: Payer: Self-pay | Admitting: Cardiovascular Disease

## 2016-12-24 DIAGNOSIS — Z7901 Long term (current) use of anticoagulants: Secondary | ICD-10-CM | POA: Insufficient documentation

## 2016-12-24 NOTE — Progress Notes (Signed)
Cardiology Office Note    Date:  12/31/2016   ID:  Dean Morgan, Dean Morgan, DOB 04/26/1931, MRN [id number]  PCP:  Marton Redwood, MD  Cardiologist:   Sanda Klein, MD   Chief Complaint  Patient presents with  . Follow-up    3 months post ppm gen change. patient reports leg pain.    History of Present Illness:  Dean Morgan, Dean Morgan is a 83 y.o. male with complete heart block and permanent atrial fibrillation here for pacemaker follow-up. He also has obstructive sleep apnea, hypertension and hyperlipidemia but does not have known coronary disease or other structural heart problems.  This is his first visit since his generator change out. The site has healed well, without any problems. He has had issues with his blood pressure being high over the last several months, but after multiple changes in his medications his blood pressure is now well within the desirable range in consistent fashion.   The patient specifically denies any chest pain at rest exertion, dyspnea at rest or with exertion, orthopnea, paroxysmal nocturnal dyspnea, syncope, palpitations, focal neurological deficits, intermittent claudication, lower extremity edema, unexplained weight gain, cough, hemoptysis or wheezing.  Pacemaker interrogation shows normal device function. He is pacemaker dependent without any underlying escape rhythm. His Medtronic Azure single-chamber pacemaker generator was implanted in September 2018, the leads were implanted in 2009. There are no detectable R waves. No episodes of high ventricular rates have been recorded.  His BP has been high this year, after being under excellent control. Renal artery duplex US November 2018 was normal. BP improved after multiple med changes. He no longer complains of fatigue. His biggest complaint is related to bilateral leg pain that occurs at rest, not with activity  Past Medical History:  Diagnosis Date  . Arthritis   . Atrial fibrillation ([place])   .  Dysrhythmia    AF  . Hyperlipemia   . Hypertension   . Pacemaker   . Pacemaker   . Sinus bradycardia seen on cardiac monitor   . Sleep apnea    does use his cpap  . Wears glasses   . Wears hearing aid     Past Surgical History:  Procedure Laterality Date  . CARDIOVERSION     multiple times-pacer put in 2009  . COLONOSCOPY    . I&D EXTREMITY Left 04/20/2013   Procedure: LEFT HAND WOUND DEBRIDEMENT AND CLOSURE ;  Surgeon: Jolyn Nap, MD;  Location: [facility];  Service: Orthopedics;  Laterality: Left;  . KNEE ARTHROSCOPY  2/10   left  . MASS EXCISION  01/29/2012   Procedure: EXCISION MASS;  Surgeon: Hessie Dibble, MD;  Location: [facility];  Service: Orthopedics;  Laterality: Left;  excision of bone spur left hand  . METACARPAL OSTEOTOMY Left 01/13/2013   Procedure: LEFT INDEX FINGER MCP RADIAL COLLATERAL REPAIR/RECONSTRUCTION VS METACARPAL ROTATIONAL OSTEOTOMY;  Surgeon: Jolyn Nap, MD;  Location: [facility];  Service: Orthopedics;  Laterality: Left;  . PACEMAKER INSERTION  2009  . PPM GENERATOR CHANGEOUT N/A 09/26/2016   Procedure: PPM Generator Changeout;  Surgeon: Sanda Klein, MD;  Location: [facility];  Service: Cardiovascular;  Laterality: N/A;  . SHOULDER ARTHROSCOPY  2004   left  . THUMB ARTHROSCOPY  2007   rt thunb mass  . TONSILLECTOMY      Current Medications: Outpatient Medications Prior to Visit  Medication Sig Dispense Refill  . amLODipine (NORVASC) 10 MG tablet Take 1  tablet by mouth daily.    . Arginine 1000 MG TABS Take 1,000 mg by mouth daily.     Marland Kitchen aspirin EC 81 MG tablet Take 81 mg by mouth at bedtime.    . celecoxib (CELEBREX) 200 MG capsule Take 200 mg by mouth daily with breakfast.     . chlorthalidone (HYGROTON) 25 MG tablet Take 1 tablet (25 mg total) by mouth daily. 30 tablet 6  . Cholecalciferol (VITAMIN D3) 5000 UNITS TABS Take 5,000 Units by mouth daily.     Marland Kitchen ezetimibe (ZETIA)  10 MG tablet Take 1 tablet (10 mg total) by mouth daily. (Patient taking differently: Take 10 mg by mouth daily with breakfast. ) 28 tablet 0  . fish oil-omega-3 fatty acids 1000 MG capsule Take 1 g by mouth 2 (two) times daily.     . Glucosamine-Chondroitin (COSAMIN DS PO) Take by mouth daily with breakfast.    . ibuprofen (ADVIL,MOTRIN) 200 MG tablet Take 400 mg by mouth every 8 (eight) hours as needed (for pain.).    Marland Kitchen Nebivolol HCl 20 MG TABS Take 2 tablets (40 mg total) by mouth daily. 30 tablet 6  . niacin (SLO-NIACIN) 500 MG tablet Take 500 mg by mouth 2 (two) times daily with a meal.    . olmesartan (BENICAR) 40 MG tablet Take 1 tablet by mouth daily.    . potassium chloride SA (K-DUR,KLOR-CON) 20 MEQ tablet TAKE 1 TABLET BY MOUTH daily 90 tablet 3  . warfarin (COUMADIN) 4 MG tablet Take 4 mg by mouth daily with breakfast.     . AZOR 10-40 MG tablet TAKE 1 TABLET BY MOUTH EVERY DAY 90 tablet 1   No facility-administered medications prior to visit.      Allergies:   Lipitor [atorvastatin]; Penicillins; and Vytorin [ezetimibe-simvastatin]   Social History   Socioeconomic History  . Marital status: Married    Spouse name: None  . Number of children: None  . Years of education: None  . Highest education level: None  Social Needs  . Financial resource strain: None  . Food insecurity - worry: None  . Food insecurity - inability: None  . Transportation needs - medical: None  . Transportation needs - non-medical: None  Occupational History  . Occupation: Dean Morgan  Tobacco Use  . Smoking status: Never Smoker  . Smokeless tobacco: Never Used  Substance and Sexual Activity  . Alcohol use: Yes    Alcohol/week: 0.0 oz    Comment: wine 3-4 times a week  . Drug use: No  . Sexual activity: None  Other Topics Concern  . None  Social History Narrative  . None     Family History:  The patient's family history includes Diabetes in his brother and maternal grandmother; Emphysema in his  unknown relative; Heart disease in his unknown relative; Hypertension in his brother; Stroke in his father and paternal grandfather.   ROS:   Please see the history of present illness.    ROS All other systems reviewed and are negative.   PHYSICAL EXAM:   VS:  BP 124/81   Pulse 89   Ht 5\' 10"  (1.778 m)   Wt 201 lb (91.2 kg)   SpO2 98%   BMI 28.84 kg/m     General: Alert, oriented x3, no distress, appears well Head: no evidence of trauma, PERRL, EOMI, no exophtalmos or lid lag, no myxedema, no xanthelasma; normal ears, nose and oropharynx Neck: normal jugular venous pulsations and no hepatojugular reflux; brisk carotid  pulses without delay and no carotid bruits Chest: clear to auscultation, no signs of consolidation by percussion or palpation, normal fremitus, symmetrical and full respiratory excursions. Healthy right subclavian pacemaker site. Cardiovascular: normal position and quality of the apical impulse, regular rhythm, normal first and paradoxically split second heart sounds, no murmurs, rubs or gallops Abdomen: no tenderness or distention, no masses by palpation, no abnormal pulsatility or arterial bruits, normal bowel sounds, no hepatosplenomegaly Extremities: no clubbing, cyanosis or edema; 2+ radial, ulnar and brachial pulses bilaterally; 2+ right femoral, posterior tibial and dorsalis pedis pulses; 2+ left femoral, posterior tibial and dorsalis pedis pulses; no subclavian or femoral bruits Neurological: grossly nonfocal Psych: Normal mood and affect   Wt Readings from Last 3 Encounters:  12/31/16 201 lb (91.2 kg)  10/24/16 201 lb (91.2 kg)  09/26/16 185 lb (83.9 kg)     Studies/Labs Reviewed:   EKG:  EKG is not ordered today.    Lipid Panel    Component Value Date/Time   CHOL 164 06/10/2012 0815   TRIG 94 06/10/2012 0815   HDL 64 06/10/2012 0815   CHOLHDL 2.6 06/10/2012 0815   VLDL 19 06/10/2012 0815   LDLCALC 81 06/10/2012 0815     ASSESSMENT:    1.  Atrial fibrillation, permanent ([place])   2. Long term current use of anticoagulant   3. Complete heart block ([place])   4. Pacemaker   5. Obstructive sleep apnea   6. Pure hypercholesterolemia   7. Essential hypertension      PLAN:  In order of problems listed above:  1. AFib: Permanent. CHADSVasc 3-5 (age 73, HTN, questionable TIA in 2002).  2. Warfarin: On appropriate anticoagulation with INR monitored by PCP. No embolic events, no bleeding complications.  3. CHB: Pacemaker dependent. 4. PPM: recent gen change, plane remote downloads every 3 months and yearly office visit 5. OSA: Reports good compliance with CPAP. 6. HLP: With history of intolerance to statins. On a combination of ezetimibe and niacin. Acceptable LDL. 7. HTN: better control after multiple med changes.    Medication Adjustments/Labs and Tests Ordered: Current medicines are reviewed at length with the patient today.  Concerns regarding medicines are outlined above.  Medication changes, Labs and Tests ordered today are listed in the Patient Instructions below. Patient Instructions  Dr Sallyanne Kuster recommends that you continue on your current medications as directed. Please refer to the Current Medication list given to you today.  Remote monitoring is used to monitor your Pacemaker or ICD from home. This monitoring reduces the number of office visits required to check your device to one time per year. It allows [place] to keep an eye on the functioning of your device to ensure it is working properly. You are scheduled for a device check from home on Monday, March 25th, 2019. You may send your transmission at any time that day. If you have a wireless device, the transmission will be sent automatically. After your physician reviews your transmission, you will receive a notification with your next transmission date.  Dr Sallyanne Kuster recommends that you schedule a follow-up appointment in 12 months with a pacemaker check. You will receive a  reminder letter in the mail two months in advance. If you don't receive a letter, please call our office to schedule the follow-up appointment.  If you need a refill on your cardiac medications before your next appointment, please call your pharmacy.    Signed, Sanda Klein, MD  12/31/2016 9:40 AM    [facility] [place], [place], [place]  [address] Phone: [phone]; Fax: [phone]

## 2016-12-25 DIAGNOSIS — Z7901 Long term (current) use of anticoagulants: Secondary | ICD-10-CM | POA: Diagnosis not present

## 2016-12-25 DIAGNOSIS — I480 Paroxysmal atrial fibrillation: Secondary | ICD-10-CM | POA: Diagnosis not present

## 2016-12-31 ENCOUNTER — Encounter: Payer: Self-pay | Admitting: Cardiovascular Disease

## 2016-12-31 ENCOUNTER — Ambulatory Visit (INDEPENDENT_AMBULATORY_CARE_PROVIDER_SITE_OTHER): Payer: Medicare Other | Admitting: Cardiovascular Disease

## 2016-12-31 VITALS — BP 124/81 | HR 89 | Ht 70.0 in | Wt 201.0 lb

## 2016-12-31 DIAGNOSIS — I4821 Permanent atrial fibrillation: Secondary | ICD-10-CM

## 2016-12-31 DIAGNOSIS — Z7901 Long term (current) use of anticoagulants: Secondary | ICD-10-CM

## 2016-12-31 DIAGNOSIS — I482 Chronic atrial fibrillation: Secondary | ICD-10-CM

## 2016-12-31 DIAGNOSIS — I442 Atrioventricular block, complete: Secondary | ICD-10-CM | POA: Diagnosis not present

## 2016-12-31 DIAGNOSIS — I10 Essential (primary) hypertension: Secondary | ICD-10-CM | POA: Diagnosis not present

## 2016-12-31 DIAGNOSIS — E7800 Pure hypercholesterolemia, unspecified: Secondary | ICD-10-CM | POA: Diagnosis not present

## 2016-12-31 DIAGNOSIS — G4733 Obstructive sleep apnea (adult) (pediatric): Secondary | ICD-10-CM | POA: Diagnosis not present

## 2016-12-31 DIAGNOSIS — Z950 Presence of cardiac pacemaker: Secondary | ICD-10-CM

## 2016-12-31 NOTE — Patient Instructions (Signed)

## 2017-01-10 ENCOUNTER — Other Ambulatory Visit: Payer: Self-pay | Admitting: Cardiovascular Disease

## 2017-01-11 ENCOUNTER — Telehealth: Payer: Self-pay

## 2017-01-11 NOTE — Telephone Encounter (Signed)
The correct dose should be Bystolic 40 mg daily (2 x 20 mg tabs, which he can take all at once or one every 12 hours, patient preference) MCr

## 2017-01-11 NOTE — Telephone Encounter (Signed)
Received a call from pharmacist at [facility] she was calling to verify Bystolic dose.Stated a prescription was sent in yesterday for 10 mg daily.After reviewing chart on snapshot Bystolic 10 mg daily and Nebivolol 20 mg 2 tablets daily.I will send message to Dr.Croitoru for advice.

## 2017-01-11 NOTE — Telephone Encounter (Signed)
Returned call to pharmacist Juliann Pulse at [facility].Dr.Croitoru's recommendations given.

## 2017-01-23 DIAGNOSIS — I480 Paroxysmal atrial fibrillation: Secondary | ICD-10-CM | POA: Diagnosis not present

## 2017-01-23 DIAGNOSIS — Z23 Encounter for immunization: Secondary | ICD-10-CM | POA: Diagnosis not present

## 2017-01-23 DIAGNOSIS — Z7901 Long term (current) use of anticoagulants: Secondary | ICD-10-CM | POA: Diagnosis not present

## 2017-02-04 DIAGNOSIS — D1801 Hemangioma of skin and subcutaneous tissue: Secondary | ICD-10-CM | POA: Diagnosis not present

## 2017-02-04 DIAGNOSIS — L853 Xerosis cutis: Secondary | ICD-10-CM | POA: Diagnosis not present

## 2017-02-04 DIAGNOSIS — Z85828 Personal history of other malignant neoplasm of skin: Secondary | ICD-10-CM | POA: Diagnosis not present

## 2017-02-04 DIAGNOSIS — Z23 Encounter for immunization: Secondary | ICD-10-CM | POA: Diagnosis not present

## 2017-02-04 DIAGNOSIS — L570 Actinic keratosis: Secondary | ICD-10-CM | POA: Diagnosis not present

## 2017-02-04 DIAGNOSIS — D225 Melanocytic nevi of trunk: Secondary | ICD-10-CM | POA: Diagnosis not present

## 2017-02-04 DIAGNOSIS — L219 Seborrheic dermatitis, unspecified: Secondary | ICD-10-CM | POA: Diagnosis not present

## 2017-02-04 DIAGNOSIS — L905 Scar conditions and fibrosis of skin: Secondary | ICD-10-CM | POA: Diagnosis not present

## 2017-02-04 DIAGNOSIS — L814 Other melanin hyperpigmentation: Secondary | ICD-10-CM | POA: Diagnosis not present

## 2017-02-04 DIAGNOSIS — L821 Other seborrheic keratosis: Secondary | ICD-10-CM | POA: Diagnosis not present

## 2017-02-07 ENCOUNTER — Ambulatory Visit (INDEPENDENT_AMBULATORY_CARE_PROVIDER_SITE_OTHER): Payer: Medicare Other | Admitting: Internal Medicine

## 2017-02-07 ENCOUNTER — Encounter: Payer: Self-pay | Admitting: Internal Medicine

## 2017-02-07 VITALS — BP 124/72 | HR 63 | Ht 70.0 in | Wt 204.6 lb

## 2017-02-07 DIAGNOSIS — I482 Chronic atrial fibrillation: Secondary | ICD-10-CM | POA: Diagnosis not present

## 2017-02-07 DIAGNOSIS — G4733 Obstructive sleep apnea (adult) (pediatric): Secondary | ICD-10-CM | POA: Diagnosis not present

## 2017-02-07 DIAGNOSIS — I4821 Permanent atrial fibrillation: Secondary | ICD-10-CM

## 2017-02-07 NOTE — Assessment & Plan Note (Signed)
He benefits from CPAP and download confirms excellent compliance. Plan-increase pressure to 9 in hopes of improving AHI

## 2017-02-07 NOTE — Progress Notes (Signed)
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 Denies problems with mask/pressure. DME:  He feels he is sleeping very well and doing very well with CPAP which has improved quality of life. He denies snoring or daytime sleepiness. Download shows 100% 4 hour compliance with residual AHI 6.4. We decided to leave pressure where it is.  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 Pt wears CPAP nightly and DL attached. Pt will need order for new supplies.  He sleeps all night every night with CPAP and feels better using it. Download 100% compliance, AHI 6.7/hour.  We talked about how to decide when to replace the mask.  He is willing to try pressure change to reduce AHI.  ROS-see HPI              + = pos Constitutional:   No-   weight loss, night sweats, fevers, chills, fatigue, lassitude. HEENT:   No-  headaches, difficulty swallowing, tooth/dental problems, sore throat,       No-  sneezing, itching, ear ache, +nasal congestion, post nasal drip,  CV:  No-   chest pain, orthopnea, PND, swelling in lower extremities, anasarca,                                                        dizziness, palpitations Resp: No-   shortness of breath with exertion or at rest.              productive cough,  No non-productive cough,  No- coughing up of blood.              No-    change in color of mucus.  No- wheezing.   Skin: No-   rash or lesions. GI:  No-   heartburn, indigestion, abdominal pain, nausea, vomiting, diarrhea,                 change in bowel habits, loss of appetite GU: No-   dysuria, change in color of urine, no urgency or frequency.  No- flank pain. MS:  No-   joint pain or swelling.  No- decreased range of motion.  No- back pain. Neuro-     nothing unusual Psych:  No- change in mood or affect. No depression or anxiety.  No memory loss.  OBJ- Physical Exam    Medium build General- Alert, Oriented, Affect-appropriate, Distress- none acute Skin- rash-none, lesions- none, excoriation- none.  Lymphadenopathy- none Head- atraumatic            Eyes- Gross vision intact, PERRLA, conjunctivae and secretions clear            Ears- Hearing, canals-normal            Nose- Clear, no-Septal dev, mucus, polyps, erosion, + perforation with clot            Throat- Mallampati II-III , mucosa clear , drainage- none, tonsils- atrophic Neck- flexible , trachea  midline, no stridor , thyroid nl, carotid no bruit Chest - symmetrical excursion , unlabored           Heart/CV- RRR-feels regular , no murmur , no gallop  , no rub, nl s1 s2                           - JVD- none , edema- none, stasis changes- none, varices- none           Lung- clear to P&A, wheeze- none, cough- none , dullness-none, rub- none           Chest wall- +pacemaker R Abd- tender-no, distended-no, bowel sounds-present, HSM- no Br/ Gen/ Rectal- Not done, not indicated Extrem- cyanosis- none, clubbing, none, atrophy- none, strength- nl Neuro- grossly intact to observation

## 2017-02-07 NOTE — Assessment & Plan Note (Signed)
Heart rate feels regular on exam at this visit with pacemaker in place.  Followed by cardiology.

## 2017-02-07 NOTE — Patient Instructions (Signed)
Order- DME [facility]- please increase CPAP to 9 cwp      Dx OSA  Please call if we can help

## 2017-02-11 ENCOUNTER — Telehealth: Payer: Self-pay | Admitting: Internal Medicine

## 2017-02-11 NOTE — Telephone Encounter (Signed)
Called to speak with pt regarding above message.  Pt states he called the incorrect office, as he meant to call PCP.  Nothing further is needed.

## 2017-02-18 LAB — CUP PACEART INCLINIC DEVICE CHECK
Battery Remaining Longevity: 138 mo
Battery Voltage: 3.16 V
Brady Statistic RV Percent Paced: 99.9 %
Implantable Lead Implant Date: 20090402
Implantable Lead Location: 753860
Implantable Lead Model: 5076
Implantable Pulse Generator Implant Date: 20180919
Lead Channel Impedance Value: 380 Ohm
Lead Channel Pacing Threshold Pulse Width: 0.4 ms
Lead Channel Sensing Intrinsic Amplitude: 15.5 mV
Lead Channel Setting Pacing Amplitude: 2.5 V
Lead Channel Setting Pacing Pulse Width: 0.4 ms
MDC IDC MSMT LEADCHNL RV IMPEDANCE VALUE: 361 Ohm
MDC IDC MSMT LEADCHNL RV PACING THRESHOLD AMPLITUDE: 0.875 V
MDC IDC MSMT LEADCHNL RV SENSING INTR AMPL: 15.5 mV
MDC IDC SESS DTM: 20181224133029
MDC IDC SET LEADCHNL RV SENSING SENSITIVITY: 4 mV

## 2017-02-20 DIAGNOSIS — L910 Hypertrophic scar: Secondary | ICD-10-CM | POA: Diagnosis not present

## 2017-02-21 DIAGNOSIS — I480 Paroxysmal atrial fibrillation: Secondary | ICD-10-CM | POA: Diagnosis not present

## 2017-02-21 DIAGNOSIS — Z7901 Long term (current) use of anticoagulants: Secondary | ICD-10-CM | POA: Diagnosis not present

## 2017-03-06 ENCOUNTER — Encounter: Payer: Self-pay | Admitting: Cardiovascular Disease

## 2017-03-06 DIAGNOSIS — R82998 Other abnormal findings in urine: Secondary | ICD-10-CM | POA: Diagnosis not present

## 2017-03-06 DIAGNOSIS — I10 Essential (primary) hypertension: Secondary | ICD-10-CM | POA: Diagnosis not present

## 2017-03-06 DIAGNOSIS — E7849 Other hyperlipidemia: Secondary | ICD-10-CM | POA: Diagnosis not present

## 2017-03-13 DIAGNOSIS — I480 Paroxysmal atrial fibrillation: Secondary | ICD-10-CM | POA: Diagnosis not present

## 2017-03-13 DIAGNOSIS — Z1389 Encounter for screening for other disorder: Secondary | ICD-10-CM | POA: Diagnosis not present

## 2017-03-13 DIAGNOSIS — E7849 Other hyperlipidemia: Secondary | ICD-10-CM | POA: Diagnosis not present

## 2017-03-13 DIAGNOSIS — Z7901 Long term (current) use of anticoagulants: Secondary | ICD-10-CM | POA: Diagnosis not present

## 2017-03-13 DIAGNOSIS — I10 Essential (primary) hypertension: Secondary | ICD-10-CM | POA: Diagnosis not present

## 2017-03-13 DIAGNOSIS — Z6831 Body mass index (BMI) 31.0-31.9, adult: Secondary | ICD-10-CM | POA: Diagnosis not present

## 2017-03-13 DIAGNOSIS — E668 Other obesity: Secondary | ICD-10-CM | POA: Diagnosis not present

## 2017-03-13 DIAGNOSIS — G3184 Mild cognitive impairment, so stated: Secondary | ICD-10-CM | POA: Diagnosis not present

## 2017-03-13 DIAGNOSIS — Z0000 Encounter for general adult medical examination without abnormal findings: Secondary | ICD-10-CM | POA: Diagnosis not present

## 2017-03-14 DIAGNOSIS — Z961 Presence of intraocular lens: Secondary | ICD-10-CM | POA: Diagnosis not present

## 2017-03-15 DIAGNOSIS — Z1212 Encounter for screening for malignant neoplasm of rectum: Secondary | ICD-10-CM | POA: Diagnosis not present

## 2017-04-01 ENCOUNTER — Ambulatory Visit (INDEPENDENT_AMBULATORY_CARE_PROVIDER_SITE_OTHER): Payer: Medicare Other | Admitting: *Deleted

## 2017-04-01 DIAGNOSIS — I442 Atrioventricular block, complete: Secondary | ICD-10-CM

## 2017-04-02 ENCOUNTER — Telehealth: Payer: Self-pay | Admitting: Cardiology

## 2017-04-02 ENCOUNTER — Encounter: Payer: Self-pay | Admitting: Cardiology

## 2017-04-02 LAB — CUP PACEART REMOTE DEVICE CHECK
Battery Remaining Longevity: 133 mo
Date Time Interrogation Session: 20190326110041
Implantable Lead Implant Date: 20090402
Implantable Lead Location: 753860
Implantable Lead Model: 5076
Implantable Pulse Generator Implant Date: 20180919
Lead Channel Pacing Threshold Amplitude: 0.875 V
Lead Channel Pacing Threshold Pulse Width: 0.4 ms
Lead Channel Sensing Intrinsic Amplitude: 15.5 mV
Lead Channel Sensing Intrinsic Amplitude: 15.5 mV
Lead Channel Setting Sensing Sensitivity: 4 mV
MDC IDC MSMT BATTERY VOLTAGE: 3.11 V
MDC IDC MSMT LEADCHNL RV IMPEDANCE VALUE: 323 Ohm
MDC IDC MSMT LEADCHNL RV IMPEDANCE VALUE: 361 Ohm
MDC IDC SET LEADCHNL RV PACING AMPLITUDE: 2.5 V
MDC IDC SET LEADCHNL RV PACING PULSEWIDTH: 0.4 ms
MDC IDC STAT BRADY RV PERCENT PACED: 99.9 %

## 2017-04-02 NOTE — Progress Notes (Signed)
Remote pacemaker transmission.   

## 2017-04-02 NOTE — Telephone Encounter (Signed)
Spoke with pt and reminded pt of remote transmission that is due today. Pt verbalized understanding.   

## 2017-04-03 DIAGNOSIS — L905 Scar conditions and fibrosis of skin: Secondary | ICD-10-CM | POA: Diagnosis not present

## 2017-04-16 DIAGNOSIS — Z7901 Long term (current) use of anticoagulants: Secondary | ICD-10-CM | POA: Diagnosis not present

## 2017-04-16 DIAGNOSIS — I480 Paroxysmal atrial fibrillation: Secondary | ICD-10-CM | POA: Diagnosis not present

## 2017-04-23 ENCOUNTER — Other Ambulatory Visit: Payer: Self-pay | Admitting: Cardiology

## 2017-04-30 DIAGNOSIS — I480 Paroxysmal atrial fibrillation: Secondary | ICD-10-CM | POA: Diagnosis not present

## 2017-04-30 DIAGNOSIS — Z7901 Long term (current) use of anticoagulants: Secondary | ICD-10-CM | POA: Diagnosis not present

## 2017-05-02 ENCOUNTER — Telehealth: Payer: Self-pay | Admitting: Cardiovascular Disease

## 2017-05-02 DIAGNOSIS — R413 Other amnesia: Secondary | ICD-10-CM

## 2017-05-02 NOTE — Telephone Encounter (Signed)
Pt's wife calling:   Stating Dr C had told pt to see a neurologist and wanting to know who he wanted to refer him to for Memory insures. Please adisve

## 2017-05-02 NOTE — Telephone Encounter (Signed)
Returned call to patient's wife.She stated she wanted to ask Dr.Croitoru which neurologist he wanted husband to see.Advised Dr.Croitoru is out of office this week.I will send message to him for advice.

## 2017-05-03 NOTE — Telephone Encounter (Signed)
I would suggest Dr. Floyde Parkins. MCr

## 2017-05-06 NOTE — Telephone Encounter (Signed)
Returned call to patient Dr.Croitoru's recommendations given.Scheduler will call back with appointment.

## 2017-05-08 ENCOUNTER — Telehealth: Payer: Self-pay | Admitting: Cardiovascular Disease

## 2017-05-08 NOTE — Telephone Encounter (Signed)
Referral in Epic, spoke with patient and he will call Dr Jannifer Franklin office directly

## 2017-05-08 NOTE — Telephone Encounter (Signed)
New message  Pt verbalzied that he is calling for RN  He is following up on the referral that Dr.Croitoru for a neurologist

## 2017-05-15 DIAGNOSIS — Z7901 Long term (current) use of anticoagulants: Secondary | ICD-10-CM | POA: Diagnosis not present

## 2017-05-15 DIAGNOSIS — I480 Paroxysmal atrial fibrillation: Secondary | ICD-10-CM | POA: Diagnosis not present

## 2017-05-15 DIAGNOSIS — L910 Hypertrophic scar: Secondary | ICD-10-CM | POA: Diagnosis not present

## 2017-06-14 ENCOUNTER — Encounter: Payer: Self-pay | Admitting: Neurology

## 2017-06-14 ENCOUNTER — Ambulatory Visit (INDEPENDENT_AMBULATORY_CARE_PROVIDER_SITE_OTHER): Payer: Medicare Other | Admitting: Neurology

## 2017-06-14 VITALS — BP 120/80 | HR 84 | Ht 70.0 in | Wt 210.0 lb

## 2017-06-14 DIAGNOSIS — R413 Other amnesia: Secondary | ICD-10-CM

## 2017-06-14 DIAGNOSIS — I480 Paroxysmal atrial fibrillation: Secondary | ICD-10-CM | POA: Diagnosis not present

## 2017-06-14 DIAGNOSIS — E538 Deficiency of other specified B group vitamins: Secondary | ICD-10-CM | POA: Diagnosis not present

## 2017-06-14 DIAGNOSIS — Z7901 Long term (current) use of anticoagulants: Secondary | ICD-10-CM | POA: Diagnosis not present

## 2017-06-14 MED ORDER — DONEPEZIL HCL 5 MG PO TABS: 5.0000 mg | ORAL_TABLET | Freq: Every day | ORAL | 1 refills | Status: DC

## 2017-06-14 NOTE — Patient Instructions (Signed)
   We will start aricept 5 mg in the morning.  Begin Aricept (donepezil) at 5 mg at night for one month. If this medication is well-tolerated, please call our office and we will call in a prescription for the 10 mg tablets. Look out for side effects that may include nausea, diarrhea, weight loss, or stomach cramps. This medication will also cause a runny nose, therefore there is no need for allergy medications for this purpose.

## 2017-06-14 NOTE — Progress Notes (Addendum)
Reason for visit: Memory disturbance  Referring physician: Dr. Vivien Rossetti, [name] is a 84 y.o. 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  The patient was found to have atrial fibrillation around that time and he has been on Coumadin therapy.  The patient has a pacemaker in, he can no longer have MRI evaluation.  The patient comes in today with his wife.  Since 2002, the patient had difficulty with spatial orientation, he will put things down backwards.  The patient has had some more pervasive memory changes over the last 2 or 3 years.  He has developed some problems with generalized short-term memory, he has difficulty with names for people and things.  He will repeat himself frequently.  He has had some safety issues with driving, he will pull out in front of individuals, he has not had any problems getting lost.  He is able to manage his own appointments and medications.  He is now getting some assistance with doing the finances with his wife.  He has sleep apnea on CPAP, he does have some daytime drowsiness.  The patient reports no significant changes in strength or sensation although he does note some troubles with numbness in the legs at times when he stands up after sitting a while.  He claims that his mother also had memory problems as she got older.  The patient was placed on Aricept previously but he had to stop the medication when he got to 10 mg at night secondary to vivid dreams, he did tolerate the 5 mg dose.  He is sent to this office for an evaluation.  Past Medical History:  Diagnosis Date  . Arthritis   . Atrial fibrillation ([place])   . Dysrhythmia    AF  . Hyperlipemia   . Hypertension   . Pacemaker   . Pacemaker   . Sinus bradycardia seen on cardiac monitor   . Sleep apnea    does use his cpap  . Wears glasses   . Wears hearing  aid     Past Surgical History:  Procedure Laterality Date  . CARDIOVERSION     multiple times-pacer put in 2009  . COLONOSCOPY    . I&D EXTREMITY Left 04/20/2013   Procedure: LEFT HAND WOUND DEBRIDEMENT AND CLOSURE ;  Surgeon: Jolyn Nap, MD;  Location: [facility];  Service: Orthopedics;  Laterality: Left;  . KNEE ARTHROSCOPY  2/10   left  . MASS EXCISION  01/29/2012   Procedure: EXCISION MASS;  Surgeon: Hessie Dibble, MD;  Location: [facility];  Service: Orthopedics;  Laterality: Left;  excision of bone spur left hand  . METACARPAL OSTEOTOMY Left 01/13/2013   Procedure: LEFT INDEX FINGER MCP RADIAL COLLATERAL REPAIR/RECONSTRUCTION VS METACARPAL ROTATIONAL OSTEOTOMY;  Surgeon: Jolyn Nap, MD;  Location: [facility];  Service: Orthopedics;  Laterality: Left;  . PACEMAKER INSERTION  2009  . PPM GENERATOR CHANGEOUT N/A 09/26/2016   Procedure: PPM Generator Changeout;  Surgeon: Sanda Klein, MD;  Location: [facility];  Service: Cardiovascular;  Laterality: N/A;  . SHOULDER ARTHROSCOPY  2004   left  . THUMB ARTHROSCOPY  2007   rt thunb mass  . TONSILLECTOMY      Family History  Problem Relation Age of Onset  . Dementia Mother   . Stroke Father   .  Hypertension Brother   . Diabetes Brother   . Diabetes Maternal Grandmother   . Stroke Paternal Grandfather   . Emphysema Unknown   . Heart disease Unknown     Social history:  reports that he has never smoked. He has never used smokeless tobacco. He reports that he drinks alcohol. He reports that he does not use drugs.  Medications:  Prior to Admission medications   Medication Sig Start Date End Date Taking? Authorizing Provider  amLODipine (NORVASC) 10 MG tablet Take 1 tablet by mouth daily. 12/25/16  Yes [provider]  Arginine 1000 MG TABS Take 1,000 mg by mouth daily.    Yes [provider]  aspirin EC 81 MG tablet Take 81 mg by mouth at  bedtime.   Yes [provider]  celecoxib (CELEBREX) 200 MG capsule Take 200 mg by mouth daily with breakfast.  01/09/13  Yes [provider]  chlorthalidone (HYGROTON) 25 MG tablet TAKE 1 TABLET BY MOUTH EVERY DAY 04/23/17  Yes Kilroy, Doreene Burke, PA-C  Cholecalciferol (VITAMIN D3) 5000 UNITS TABS Take 5,000 Units by mouth daily.    Yes [provider]  ezetimibe (ZETIA) 10 MG tablet Take 1 tablet (10 mg total) by mouth daily. Patient taking differently: Take 10 mg by mouth daily with breakfast.  08/04/13  Yes Croitoru, Mihai, MD  fish oil-omega-3 fatty acids 1000 MG capsule Take 1 g by mouth 2 (two) times daily.    Yes [provider]  Glucosamine-Chondroitin (COSAMIN DS PO) Take by mouth daily with breakfast.   Yes [provider]  ibuprofen (ADVIL,MOTRIN) 200 MG tablet Take 400 mg by mouth every 8 (eight) hours as needed (for pain.).   Yes [provider]  Nebivolol HCl (BYSTOLIC) 20 MG TABS Take 2 tablets ( 40 mg ) daily 01/11/17  Yes Croitoru, Mihai, MD  niacin (SLO-NIACIN) 500 MG tablet Take 500 mg by mouth 2 (two) times daily with a meal.   Yes [provider]  olmesartan (BENICAR) 40 MG tablet Take 1 tablet by mouth daily. 12/19/16  Yes [provider]  potassium chloride SA (K-DUR,KLOR-CON) 20 MEQ tablet TAKE 1 TABLET BY MOUTH daily 10/24/16  Yes Croitoru, Mihai, MD  warfarin (COUMADIN) 4 MG tablet Take 4 mg by mouth daily with breakfast.    Yes [provider]  donepezil (ARICEPT) 5 MG tablet Take 1 tablet (5 mg total) by mouth daily. 06/14/17   Kathrynn Ducking, MD      Allergies  Allergen Reactions  . Lipitor [Atorvastatin] Other (See Comments)    Muscle pain  . Penicillins Rash    Has patient had a PCN reaction causing immediate rash, facial/tongue/throat swelling, SOB or lightheadedness with hypotension: Unknown Has patient had a PCN reaction causing severe rash involving mucus membranes or skin necrosis:  Unknown Has patient had a PCN reaction that required hospitalization: No Has patient had a PCN reaction occurring within the last 10 years: No If all of the above answers are "NO", then may proceed with Cephalosporin use.   . Vytorin [Ezetimibe-Simvastatin] Other (See Comments)    Muscle pain    ROS:  Out of a complete 14 system review of symptoms, the patient complains only of the following symptoms, and all other reviewed systems are negative.  Fatigue Hearing loss, ringing in the ears Easy bruising, easy bleeding Impotence Allergies, skin sensitivity Memory loss, weakness Decreased energy Snoring on CPAP  Blood pressure 120/80, pulse 84, height 5\' 10"  (1.778 m), weight  210 lb (95.3 kg).  Physical Exam  General: The patient is alert and cooperative at the time of the examination.  The patient is moderately obese.  Eyes: Pupils are equal, round, and reactive to light. Discs are flat bilaterally.  Neck: The neck is supple, no carotid bruits are noted.  Respiratory: The respiratory examination is clear.  Cardiovascular: The cardiovascular examination reveals a regular rate and rhythm, no obvious murmurs or rubs are noted.  Skin: Extremities are without significant edema.  Neurologic Exam  Mental status: The patient is alert and oriented x 3 at the time of the examination. The Mini-Mental status examination done today shows a total score of 27/30.  Cranial nerves: Facial symmetry is present. There is good sensation of the face to pinprick and soft touch bilaterally. The strength of the facial muscles and the muscles to head turning and shoulder shrug are normal bilaterally. Speech is well enunciated, no aphasia or dysarthria is noted. Extraocular movements are full. Visual fields are full. The tongue is midline, and the patient has symmetric elevation of the soft palate. No obvious hearing deficits are noted.  Motor: The motor testing reveals 5 over 5 strength of all 4  extremities. Good symmetric motor tone is noted throughout.  Sensory: Sensory testing is intact to pinprick, soft touch, vibration sensation, and position sense on all 4 extremities. No evidence of extinction is noted.  Coordination: Cerebellar testing reveals good finger-nose-finger and heel-to-shin bilaterally.  Gait and station: Gait is normal. Tandem gait is minimally unsteady. Romberg is negative. No drift is seen.  Reflexes: Deep tendon reflexes are symmetric and normal bilaterally. Toes are downgoing bilaterally.   Assessment/Plan:  1.  Progressive memory disturbance  2.  History of cerebrovascular disease  The patient will be sent for a CT scan of the brain.  He will undergo blood work today.  The patient will be placed back on Aricept taking 5 mg in the morning, not in the evening.  It is possible in the future he may be able to tolerate a 10 mg dose in the morning.  The patient will follow-up in 6 months.  The patient apparently is having some safety issues while driving, I have recommended that the driving be curtailed if this is the issue.  Jill Alexanders MD 06/14/2017 12:22 PM  [facility] [address] [place], [place] [address]  Phone [phone] Fax [phone]

## 2017-06-15 LAB — VITAMIN B12: Vitamin B-12: 757 pg/mL (ref 232–1245)

## 2017-06-15 LAB — SEDIMENTATION RATE: Sed Rate: 15 mm/hr (ref 0–30)

## 2017-06-15 LAB — RPR: RPR: NONREACTIVE

## 2017-06-15 NOTE — Progress Notes (Signed)
Thank you, [name]. His pacemaker system is actually "MRI-conditional". With appropriate precautions we can get an MRI of the brain. If you believe it is important, please order it and I will make the necessary arrangements. Always grateful for your help, [name]

## 2017-06-17 ENCOUNTER — Telehealth: Payer: Self-pay | Admitting: Neurology

## 2017-06-17 ENCOUNTER — Telehealth: Payer: Self-pay | Admitting: *Deleted

## 2017-06-17 NOTE — Telephone Encounter (Signed)
Called and LVM about unremarkable labs per CW,MD note.   Also advised CT order sent to [facility]. He can call them at [phone] to schedule.

## 2017-06-17 NOTE — Telephone Encounter (Signed)
-----   Message from Kathrynn Ducking, MD sent at 06/15/2017 12:22 PM EDT -----  The blood work results are unremarkable. Please call the patient.  ----- Message ----- From: Lavone Neri Lab Results In Sent: 06/15/2017   5:40 AM To: Kathrynn Ducking, MD

## 2017-06-17 NOTE — Telephone Encounter (Signed)
Medicare/BCBS supp order sent to [facility]. No auth they will reach out to the pt to schedule.  °

## 2017-06-27 ENCOUNTER — Ambulatory Visit
Admission: RE | Admit: 2017-06-27 | Discharge: 2017-06-27 | Disposition: A | Discharge status: Home / Self Care | Payer: Medicare Other | Source: Ambulatory Visit | Attending: Neurology | Admitting: Neurology

## 2017-06-27 DIAGNOSIS — R413 Other amnesia: Secondary | ICD-10-CM

## 2017-06-27 DIAGNOSIS — D485 Neoplasm of uncertain behavior of skin: Secondary | ICD-10-CM | POA: Diagnosis not present

## 2017-06-27 DIAGNOSIS — B079 Viral wart, unspecified: Secondary | ICD-10-CM | POA: Diagnosis not present

## 2017-06-28 ENCOUNTER — Telehealth: Payer: Self-pay | Admitting: Neurology

## 2017-06-28 DIAGNOSIS — D485 Neoplasm of uncertain behavior of skin: Secondary | ICD-10-CM | POA: Diagnosis not present

## 2017-06-28 DIAGNOSIS — B079 Viral wart, unspecified: Secondary | ICD-10-CM | POA: Diagnosis not present

## 2017-06-28 NOTE — Telephone Encounter (Signed)
I called the patient.  The CT scan of the brain shows no significant small vessel ischemic changes, some degree of atrophy is seen.  Nothing structurally that would impair memory.   CT brain 06/28/17:  IMPRESSION: This CT scan of the head without contrast shows mild to moderate age-related generalized cortical atrophy.   There are no significant chronic ischemic changes.    There are no acute findings.

## 2017-07-01 ENCOUNTER — Ambulatory Visit (INDEPENDENT_AMBULATORY_CARE_PROVIDER_SITE_OTHER): Payer: Medicare Other | Admitting: *Deleted

## 2017-07-01 ENCOUNTER — Telehealth: Payer: Self-pay | Admitting: Cardiovascular Disease

## 2017-07-01 DIAGNOSIS — I442 Atrioventricular block, complete: Secondary | ICD-10-CM | POA: Diagnosis not present

## 2017-07-01 NOTE — Progress Notes (Signed)
Remote pacemaker transmission.   

## 2017-07-01 NOTE — Telephone Encounter (Signed)
Spoke w/ pt and informed him that his remote transmission was received.  

## 2017-07-01 NOTE — Telephone Encounter (Signed)
New Message    Patient is returning call in reference to his remote transmission. Please call to discuss.

## 2017-07-02 ENCOUNTER — Encounter: Payer: Self-pay | Admitting: Cardiology

## 2017-07-04 LAB — CUP PACEART REMOTE DEVICE CHECK
Battery Remaining Longevity: 130 mo
Implantable Lead Location: 753860
Implantable Lead Model: 5076
Implantable Pulse Generator Implant Date: 20180919
Lead Channel Impedance Value: 361 Ohm
Lead Channel Sensing Intrinsic Amplitude: 12.5 mV
Lead Channel Sensing Intrinsic Amplitude: 12.5 mV
Lead Channel Setting Pacing Amplitude: 2.5 V
Lead Channel Setting Pacing Pulse Width: 0.4 ms
Lead Channel Setting Sensing Sensitivity: 4 mV
MDC IDC LEAD IMPLANT DT: 20090402
MDC IDC MSMT BATTERY VOLTAGE: 3.07 V
MDC IDC MSMT LEADCHNL RV IMPEDANCE VALUE: 323 Ohm
MDC IDC MSMT LEADCHNL RV PACING THRESHOLD AMPLITUDE: 0.875 V
MDC IDC MSMT LEADCHNL RV PACING THRESHOLD PULSEWIDTH: 0.4 ms
MDC IDC SESS DTM: 20190624200046
MDC IDC STAT BRADY RV PERCENT PACED: 99.78 %

## 2017-07-05 NOTE — Telephone Encounter (Signed)
I called the wife.  The patient is having progressive memory problem, the CT scan does not show extensive white matter changes, he does have atrophy.  We are likely dealing with a an Alzheimer's process, he will remain on Aricept as long as he is tolerating this.  We will follow-up in about 6 months.

## 2017-07-05 NOTE — Telephone Encounter (Signed)
Pt wife(on DPR-[place],[phone]) has called asking that Dr Jannifer Franklin calls her to discuss in greater detail the results of the CT of pt

## 2017-07-17 DIAGNOSIS — L910 Hypertrophic scar: Secondary | ICD-10-CM | POA: Diagnosis not present

## 2017-07-17 DIAGNOSIS — L905 Scar conditions and fibrosis of skin: Secondary | ICD-10-CM | POA: Diagnosis not present

## 2017-07-17 DIAGNOSIS — Z7901 Long term (current) use of anticoagulants: Secondary | ICD-10-CM | POA: Diagnosis not present

## 2017-07-17 DIAGNOSIS — I480 Paroxysmal atrial fibrillation: Secondary | ICD-10-CM | POA: Diagnosis not present

## 2017-08-22 ENCOUNTER — Other Ambulatory Visit: Payer: Self-pay | Admitting: Cardiovascular Disease

## 2017-08-22 DIAGNOSIS — Z7901 Long term (current) use of anticoagulants: Secondary | ICD-10-CM | POA: Diagnosis not present

## 2017-08-22 DIAGNOSIS — I480 Paroxysmal atrial fibrillation: Secondary | ICD-10-CM | POA: Diagnosis not present

## 2017-09-11 ENCOUNTER — Other Ambulatory Visit: Payer: Self-pay | Admitting: Neurology

## 2017-09-13 DIAGNOSIS — L910 Hypertrophic scar: Secondary | ICD-10-CM | POA: Diagnosis not present

## 2017-09-16 ENCOUNTER — Other Ambulatory Visit: Payer: Self-pay | Admitting: Cardiology

## 2017-09-30 ENCOUNTER — Ambulatory Visit (INDEPENDENT_AMBULATORY_CARE_PROVIDER_SITE_OTHER): Payer: Medicare Other | Admitting: *Deleted

## 2017-09-30 ENCOUNTER — Telehealth: Payer: Self-pay | Admitting: Cardiology

## 2017-09-30 DIAGNOSIS — I442 Atrioventricular block, complete: Secondary | ICD-10-CM | POA: Diagnosis not present

## 2017-09-30 NOTE — Telephone Encounter (Signed)
LMOVM reminding pt to send remote transmission.   

## 2017-10-01 ENCOUNTER — Encounter: Payer: Self-pay | Admitting: Cardiology

## 2017-10-01 NOTE — Progress Notes (Signed)
Remote pacemaker transmission.   

## 2017-10-01 NOTE — Progress Notes (Signed)
Letter  

## 2017-10-03 DIAGNOSIS — I480 Paroxysmal atrial fibrillation: Secondary | ICD-10-CM | POA: Diagnosis not present

## 2017-10-03 DIAGNOSIS — Z23 Encounter for immunization: Secondary | ICD-10-CM | POA: Diagnosis not present

## 2017-10-03 DIAGNOSIS — Z7901 Long term (current) use of anticoagulants: Secondary | ICD-10-CM | POA: Diagnosis not present

## 2017-10-03 DIAGNOSIS — M19042 Primary osteoarthritis, left hand: Secondary | ICD-10-CM | POA: Diagnosis not present

## 2017-10-18 LAB — CUP PACEART REMOTE DEVICE CHECK
Battery Remaining Longevity: 128 mo
Battery Voltage: 3.04 V
Brady Statistic RV Percent Paced: 99.67 %
Implantable Lead Model: 5076
Lead Channel Pacing Threshold Amplitude: 1 V
Lead Channel Pacing Threshold Pulse Width: 0.4 ms
Lead Channel Sensing Intrinsic Amplitude: 12.25 mV
Lead Channel Setting Pacing Amplitude: 2.5 V
Lead Channel Setting Pacing Pulse Width: 0.4 ms
MDC IDC LEAD IMPLANT DT: 20090402
MDC IDC LEAD LOCATION: 753860
MDC IDC MSMT LEADCHNL RV IMPEDANCE VALUE: 342 Ohm
MDC IDC MSMT LEADCHNL RV IMPEDANCE VALUE: 380 Ohm
MDC IDC MSMT LEADCHNL RV SENSING INTR AMPL: 12.25 mV
MDC IDC PG IMPLANT DT: 20180919
MDC IDC SESS DTM: 20190924152307
MDC IDC SET LEADCHNL RV SENSING SENSITIVITY: 4 mV

## 2017-11-07 DIAGNOSIS — Z7901 Long term (current) use of anticoagulants: Secondary | ICD-10-CM | POA: Diagnosis not present

## 2017-11-07 DIAGNOSIS — I480 Paroxysmal atrial fibrillation: Secondary | ICD-10-CM | POA: Diagnosis not present

## 2017-11-16 ENCOUNTER — Other Ambulatory Visit: Payer: Self-pay | Admitting: Cardiology

## 2017-11-18 NOTE — Telephone Encounter (Signed)
Rx has been sent to the pharmacy electronically. ° °

## 2017-12-10 DIAGNOSIS — Z7901 Long term (current) use of anticoagulants: Secondary | ICD-10-CM | POA: Diagnosis not present

## 2017-12-10 DIAGNOSIS — I480 Paroxysmal atrial fibrillation: Secondary | ICD-10-CM | POA: Diagnosis not present

## 2017-12-11 DIAGNOSIS — L905 Scar conditions and fibrosis of skin: Secondary | ICD-10-CM | POA: Diagnosis not present

## 2017-12-16 ENCOUNTER — Ambulatory Visit (INDEPENDENT_AMBULATORY_CARE_PROVIDER_SITE_OTHER): Payer: Medicare Other | Admitting: Neurology

## 2017-12-16 ENCOUNTER — Encounter: Payer: Self-pay | Admitting: *Deleted

## 2017-12-16 DIAGNOSIS — R413 Other amnesia: Secondary | ICD-10-CM

## 2017-12-16 HISTORY — DX: Other amnesia: R41.3

## 2017-12-16 MED ORDER — DONEPEZIL HCL 10 MG PO TABS: 10.0000 mg | ORAL_TABLET | ORAL | 1 refills | Status: DC
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
## 2017-12-31 ENCOUNTER — Ambulatory Visit (INDEPENDENT_AMBULATORY_CARE_PROVIDER_SITE_OTHER): Payer: Medicare Other

## 2017-12-31 DIAGNOSIS — I442 Atrioventricular block, complete: Secondary | ICD-10-CM

## 2018-01-01 LAB — CUP PACEART REMOTE DEVICE CHECK
Date Time Interrogation Session: 20191225011303
Implantable Lead Implant Date: 20090402
Implantable Lead Location: 753860
Implantable Lead Model: 5076
Implantable Pulse Generator Implant Date: 20180919
Lead Channel Pacing Threshold Amplitude: 1 V
Lead Channel Pacing Threshold Pulse Width: 0.4 ms
Lead Channel Sensing Intrinsic Amplitude: 12.25 mV
Lead Channel Sensing Intrinsic Amplitude: 12.25 mV
Lead Channel Setting Pacing Pulse Width: 0.4 ms
MDC IDC MSMT BATTERY REMAINING LONGEVITY: 124 mo
MDC IDC MSMT BATTERY VOLTAGE: 3.03 V
MDC IDC MSMT LEADCHNL RV IMPEDANCE VALUE: 323 Ohm
MDC IDC MSMT LEADCHNL RV IMPEDANCE VALUE: 361 Ohm
MDC IDC SET LEADCHNL RV PACING AMPLITUDE: 2.5 V
MDC IDC SET LEADCHNL RV SENSING SENSITIVITY: 4 mV
MDC IDC STAT BRADY RV PERCENT PACED: 99.77 %

## 2018-01-02 NOTE — Progress Notes (Signed)
Remote pacemaker transmission.   

## 2018-01-09 DIAGNOSIS — I480 Paroxysmal atrial fibrillation: Secondary | ICD-10-CM | POA: Diagnosis not present

## 2018-01-09 DIAGNOSIS — Z7901 Long term (current) use of anticoagulants: Secondary | ICD-10-CM | POA: Diagnosis not present

## 2018-01-10 ENCOUNTER — Encounter: Payer: Self-pay | Admitting: Cardiovascular Disease

## 2018-01-10 ENCOUNTER — Ambulatory Visit (INDEPENDENT_AMBULATORY_CARE_PROVIDER_SITE_OTHER): Payer: Medicare Other | Admitting: Cardiovascular Disease

## 2018-01-10 VITALS — BP 110/70 | HR 83 | Ht 70.0 in | Wt 204.0 lb

## 2018-01-10 DIAGNOSIS — I4821 Permanent atrial fibrillation: Secondary | ICD-10-CM

## 2018-01-10 DIAGNOSIS — I10 Essential (primary) hypertension: Secondary | ICD-10-CM

## 2018-01-10 DIAGNOSIS — G4733 Obstructive sleep apnea (adult) (pediatric): Secondary | ICD-10-CM | POA: Diagnosis not present

## 2018-01-10 DIAGNOSIS — Z7901 Long term (current) use of anticoagulants: Secondary | ICD-10-CM | POA: Diagnosis not present

## 2018-01-10 DIAGNOSIS — I442 Atrioventricular block, complete: Secondary | ICD-10-CM | POA: Diagnosis not present

## 2018-01-10 DIAGNOSIS — Z950 Presence of cardiac pacemaker: Secondary | ICD-10-CM | POA: Diagnosis not present

## 2018-01-10 DIAGNOSIS — E7800 Pure hypercholesterolemia, unspecified: Secondary | ICD-10-CM | POA: Diagnosis not present

## 2018-01-10 LAB — CUP PACEART INCLINIC DEVICE CHECK
Implantable Lead Location: 753860
MDC IDC LEAD IMPLANT DT: 20090402
MDC IDC PG IMPLANT DT: 20180919
MDC IDC SESS DTM: 20200131164211

## 2018-01-10 NOTE — Progress Notes (Signed)
Cardiology Office Note    Date:  01/10/2018   ID:  [name], [name], DOB 04-Aug-1931, MRN [id number]  PCP:  Marton Redwood, MD  Cardiologist:   Sanda Klein, MD   Chief Complaint  Patient presents with  . Atrial Fibrillation  . 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 male with complete heart block and permanent atrial fibrillation here for pacemaker follow-up. He also has obstructive sleep apnea, hypertension and hyperlipidemia but does not have known coronary disease or other structural heart problems.  Dr. 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  He has not had any cardiac problems. The patient specifically denies any chest pain at rest exertion, dyspnea at rest or with exertion, orthopnea, paroxysmal nocturnal dyspnea, syncope, palpitations, focal neurological deficits, intermittent claudication, lower extremity edema, unexplained weight gain, cough, hemoptysis or wheezing.  Pacemaker interrogation shows normal device function. He is pacemaker dependent without any underlying escape rhythm. His Medtronic Azure single-chamber pacemaker generator was implanted in September 2018, the leads were implanted in 2009. There are no detectable R waves.  A single 4 beat episode of nonsustained VT is recorded.  Blood pressure is well controlled.  Last year he had some issues with elevated blood pressure and the renal Doppler ultrasound in November 2018  was normal.  Past Medical History:  Diagnosis Date  . Arthritis   . Atrial fibrillation ([place])   . Dysrhythmia    AF  . Hyperlipemia   . Hypertension   . Memory difficulty 12/16/2017  . Pacemaker   . Pacemaker   . Sinus bradycardia seen on cardiac monitor   . Sleep apnea    does use his cpap  . Wears glasses   . Wears hearing aid     Past Surgical History:  Procedure Laterality Date  . CARDIOVERSION     multiple times-pacer put in 2009  . COLONOSCOPY    . I&D EXTREMITY Left  04/20/2013   Procedure: LEFT HAND WOUND DEBRIDEMENT AND CLOSURE ;  Surgeon: Jolyn Nap, MD;  Location: [facility];  Service: Orthopedics;  Laterality: Left;  . KNEE ARTHROSCOPY  2/10   left  . MASS EXCISION  01/29/2012   Procedure: EXCISION MASS;  Surgeon: Hessie Dibble, MD;  Location: [facility];  Service: Orthopedics;  Laterality: Left;  excision of bone spur left hand  . METACARPAL OSTEOTOMY Left 01/13/2013   Procedure: LEFT INDEX FINGER MCP RADIAL COLLATERAL REPAIR/RECONSTRUCTION VS METACARPAL ROTATIONAL OSTEOTOMY;  Surgeon: Jolyn Nap, MD;  Location: [facility];  Service: Orthopedics;  Laterality: Left;  . PACEMAKER INSERTION  2009  . PPM GENERATOR CHANGEOUT N/A 09/26/2016   Procedure: PPM Generator Changeout;  Surgeon: Sanda Klein, MD;  Location: [facility];  Service: Cardiovascular;  Laterality: N/A;  . SHOULDER ARTHROSCOPY  2004   left  . THUMB ARTHROSCOPY  2007   rt thunb mass  . TONSILLECTOMY      Current Medications: Outpatient Medications Prior to Visit  Medication Sig Dispense Refill  . amLODipine (NORVASC) 10 MG tablet Take 1 tablet by mouth daily.    . Arginine 1000 MG TABS Take 1,000 mg by mouth daily.     Marland Kitchen aspirin EC 81 MG tablet Take 81 mg by mouth at bedtime.    Marland Kitchen BYSTOLIC 20 MG TABS TAKE 2 TABLETS BY MOUTH EVERY DAY 60 tablet 6  . celecoxib (CELEBREX) 200  MG capsule Take 200 mg by mouth daily with breakfast.     . chlorthalidone (HYGROTON) 25 MG tablet TAKE 1 TABLET BY MOUTH EVERY DAY 90 tablet 0  . Cholecalciferol (VITAMIN D3) 5000 UNITS TABS Take 5,000 Units by mouth daily.     Marland Kitchen donepezil (ARICEPT) 10 MG tablet Take 1 tablet (10 mg total) by mouth every morning. 90 tablet 1  . ezetimibe (ZETIA) 10 MG tablet Take 1 tablet (10 mg total) by mouth daily. (Patient taking differently: Take 10 mg by mouth daily with breakfast. ) 28 tablet 0  . fish oil-omega-3 fatty acids 1000 MG capsule Take 1 g by  mouth 2 (two) times daily.     . Glucosamine-Chondroitin (COSAMIN DS PO) Take by mouth daily with breakfast.    . ibuprofen (ADVIL,MOTRIN) 200 MG tablet Take 400 mg by mouth every 8 (eight) hours as needed (for pain.).    Marland Kitchen Nebivolol HCl (BYSTOLIC) 20 MG TABS Take 2 tablets ( 40 mg ) daily 30 tablet 6  . niacin (SLO-NIACIN) 500 MG tablet Take 500 mg by mouth 2 (two) times daily with a meal.    . olmesartan (BENICAR) 40 MG tablet Take 1 tablet by mouth daily.    . potassium chloride SA (K-DUR,KLOR-CON) 20 MEQ tablet TAKE 1 TABLET BY MOUTH daily 90 tablet 3  . warfarin (COUMADIN) 4 MG tablet Take 4 mg by mouth daily with breakfast.      No facility-administered medications prior to visit.      Allergies:   Lipitor [atorvastatin]; Penicillins; and Vytorin [ezetimibe-simvastatin]   Social History   Socioeconomic History  . Marital status: Married    Spouse name: Masami Plata  . Number of children: 2  . Years of education: [name]  . Highest education level: Not on file  Occupational History  . Occupation: [name]-Retired  Social Needs  . Financial resource strain: Not on file  . Food insecurity:    Worry: Not on file    Inability: Not on file  . Transportation needs:    Medical: Not on file    Non-medical: Not on file  Tobacco Use  . Smoking status: Never Smoker  . Smokeless tobacco: Never Used  Substance and Sexual Activity  . Alcohol use: Yes    Alcohol/week: 0.0 standard drinks    Comment: wine 3-4 times a week  . Drug use: No  . Sexual activity: Not on file  Lifestyle  . Physical activity:    Days per week: Not on file    Minutes per session: Not on file  . Stress: Not on file  Relationships  . Social connections:    Talks on phone: Not on file    Gets together: Not on file    Attends religious service: Not on file    Active member of club or organization: Not on file    Attends meetings of clubs or organizations: Not on file    Relationship status: Not on file  Other  Topics Concern  . Not on file  Social History Narrative   Lives with wife   Right handed    Caffeine use: 1-2 cups coffee per day     Family History:  The patient's family history includes Dementia in his mother; Diabetes in his brother and maternal grandmother; Emphysema in an other family member; Heart disease in an other family member; Hypertension in his brother; Stroke in his father and paternal grandfather.   ROS:   Please see the history of  present illness.    ROS All other systems reviewed and are negative.   PHYSICAL EXAM:   VS:  BP 110/70 (BP Location: Left Arm, Patient Position: Sitting, Cuff Size: Normal)   Pulse 83   Ht 5\' 10"  (1.778 m)   Wt 204 lb (92.5 kg)   BMI 29.27 kg/m     General: Alert, oriented x3, no distress, appears well Head: no evidence of trauma, PERRL, EOMI, no exophtalmos or lid lag, no myxedema, no xanthelasma; normal ears, nose and oropharynx Neck: normal jugular venous pulsations and no hepatojugular reflux; brisk carotid pulses without delay and no carotid bruits Chest: clear to auscultation, no signs of consolidation by percussion or palpation, normal fremitus, symmetrical and full respiratory excursions. Healthy right subclavian pacemaker site. Cardiovascular: normal position and quality of the apical impulse, regular rhythm, normal first and paradoxically split second heart sounds, no murmurs, rubs or gallops Abdomen: no tenderness or distention, no masses by palpation, no abnormal pulsatility or arterial bruits, normal bowel sounds, no hepatosplenomegaly Extremities: no clubbing, cyanosis or edema; 2+ radial, ulnar and brachial pulses bilaterally; 2+ right femoral, posterior tibial and dorsalis pedis pulses; 2+ left femoral, posterior tibial and dorsalis pedis pulses; no subclavian or femoral bruits Neurological: grossly nonfocal Psych: Normal mood and affect   Wt Readings from Last 3 Encounters:  01/10/18 204 lb (92.5 kg)  12/16/17 202 lb  (91.6 kg)  06/14/17 210 lb (95.3 kg)     Studies/Labs Reviewed:   EKG:  EKG is not ordered today.  Intracardiac electrogram shows ventricular paced rhythm  Lipid Panel    Component Value Date/Time   CHOL 164 06/10/2012 0815   TRIG 94 06/10/2012 0815   HDL 64 06/10/2012 0815   CHOLHDL 2.6 06/10/2012 0815   VLDL 19 06/10/2012 0815   LDLCALC 81 06/10/2012 0815     ASSESSMENT:    1. Atrial fibrillation, permanent ([place])   2. Long term current use of anticoagulant   3. Complete heart block ([place])   4. Pacemaker   5. Obstructive sleep apnea   6. Pure hypercholesterolemia   7. Essential hypertension      PLAN:  In order of problems listed above:  1. AFib: Permanent. CHADSVasc 3-5 (age 43, HTN, questionable TIA in 2002).  2. Warfarin: On appropriate anticoagulation with INR monitored by PCP. No embolic events, no bleeding complications.  3. CHB: Pacemaker dependent. 4. PPM: Normal device function.  Remote downloads every 3 months and yearly office visits. 5. OSA: Compliant with CPAP with good response to therapy. 6. HLP: Last LDL was rather high at 138.  He is intolerant to statins.  On Zetia.  No known vascular disease.  I do not think it is worth starting PCSK9 inhibitors for primary prevention in this 85 year old gentleman. 7. HTN: Well-controlled.    Medication Adjustments/Labs and Tests Ordered: Current medicines are reviewed at length with the patient today.  Concerns regarding medicines are outlined above.  Medication changes, Labs and Tests ordered today are listed in the Patient Instructions below. Patient Instructions  Medication Instructions:  Continue current medications  If you need a refill on your cardiac medications before your next appointment, please call your pharmacy.  Testing/Procedures: Remote monitoring is used to monitor your Pacemaker or ICD from home. This monitoring reduces the number of office visits required to check your device to one time per  year. It allows [place] to keep an eye on the functioning of your device to ensure it is working properly. You are  scheduled for a device check from home on Tuesday, March 24th, 2020. You may send your transmission at any time that day. If you have a wireless device, the transmission will be sent automatically. After your physician reviews your transmission, you will receive a postcard with your next transmission date.  To improve our patient care and to more adequately follow your device, [facility] has decided, as a practice, to start following each patient four times a year with your home monitor. This means that you may experience a remote appointment that is close to an in-office appointment with your physician. Your insurance will apply at the same rate as other remote monitoring transmissions.  Follow-Up: You will need a follow up appointment in 12 months.  Please call our office 2 months in advance to schedule this appointment.  You may see Dr Sallyanne Kuster or one of the following Advanced Practice Providers on your designated Care Team.  At [facility], you and your health needs are our priority.  As part of our continuing mission to provide you with exceptional heart care, we have created designated Provider Care Teams.  These Care Teams include your primary Cardiologist (physician) and Advanced Practice Providers (APPs -  Physician Assistants and Nurse Practitioners) who all work together to provide you with the care you need, when you need it.  Thank you for choosing [facility] at [facility]!!            Signed, Sanda Klein, MD  01/10/2018 8:41 AM    [address], [place], [place]  [address] Phone: [phone]; Fax: [phone]

## 2018-01-10 NOTE — Patient Instructions (Addendum)
Medication Instructions:  Continue current medications  If you need a refill on your cardiac medications before your next appointment, please call your pharmacy.  Testing/Procedures: Remote monitoring is used to monitor your Pacemaker or ICD from home. This monitoring reduces the number of office visits required to check your device to one time per year. It allows [place] to keep an eye on the functioning of your device to ensure it is working properly. You are scheduled for a device check from home on Tuesday, March 24th, 2020. You may send your transmission at any time that day. If you have a wireless device, the transmission will be sent automatically. After your physician reviews your transmission, you will receive a postcard with your next transmission date.  To improve our patient care and to more adequately follow your device, [facility] has decided, as a practice, to start following each patient four times a year with your home monitor. This means that you may experience a remote appointment that is close to an in-office appointment with your physician. Your insurance will apply at the same rate as other remote monitoring transmissions.  Follow-Up: You will need a follow up appointment in 12 months.  Please call our office 2 months in advance to schedule this appointment.  You may see Dr Sallyanne Kuster or one of the following Advanced Practice Providers on your designated Care Team.  At [facility], you and your health needs are our priority.  As part of our continuing mission to provide you with exceptional heart care, we have created designated Provider Care Teams.  These Care Teams include your primary Cardiologist (physician) and Advanced Practice Providers (APPs -  Physician Assistants and Nurse Practitioners) who all work together to provide you with the care you need, when you need it.  Thank you for choosing [facility] at [facility]!!

## 2018-01-17 ENCOUNTER — Other Ambulatory Visit: Payer: Self-pay | Admitting: Cardiovascular Disease

## 2018-02-06 ENCOUNTER — Telehealth: Payer: Self-pay | Admitting: Internal Medicine

## 2018-02-06 NOTE — Telephone Encounter (Signed)
Checking Airview and latest download is 54 days ago. Called and spoke with patient. Let him know if he didn't know where his SD card was then he needed to bring in his whole machine. Patient verbalized understanding. Nothing further needed.

## 2018-02-07 ENCOUNTER — Encounter: Payer: Self-pay | Admitting: Internal Medicine

## 2018-02-07 ENCOUNTER — Ambulatory Visit (INDEPENDENT_AMBULATORY_CARE_PROVIDER_SITE_OTHER): Payer: Medicare Other | Admitting: Internal Medicine

## 2018-02-07 VITALS — BP 106/72 | HR 83 | Ht 70.0 in | Wt 203.4 lb

## 2018-02-07 DIAGNOSIS — I4821 Permanent atrial fibrillation: Secondary | ICD-10-CM | POA: Diagnosis not present

## 2018-02-07 DIAGNOSIS — G4733 Obstructive sleep apnea (adult) (pediatric): Secondary | ICD-10-CM | POA: Diagnosis not present

## 2018-02-07 LAB — CUP PACEART INCLINIC DEVICE CHECK
MDC IDC PG IMPLANT DT: 20180919
MDC IDC SESS DTM: 20200131123625

## 2018-02-07 NOTE — Assessment & Plan Note (Signed)
Rhythm today consistent with known pacemaker.  Followed by cardiology.

## 2018-02-07 NOTE — Progress Notes (Signed)
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 Pt wears CPAP nightly and DL attached. Pt will need order for new supplies.  He sleeps all night every night with CPAP and feels better using it. Download 100% compliance, AHI 6.7/hour.  We talked about how to decide when to replace the mask.  He is willing to try pressure change to reduce AHI.  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  Wife comes with him and confirms that he uses CPAP every night, does not snore and seems to be doing fine.  Nasal pillows mask.  He is comfortable with current pressure.  We discussed potential conversion to AutoPap when he replaces his old machine.  We discussed availability of smaller travel machines.   ROS-see HPI              + = positive Constitutional:   No-   weight loss, night sweats, fevers, chills, fatigue, lassitude. HEENT:   No-  headaches, difficulty swallowing, tooth/dental problems, sore throat,       No-  sneezing, itching, ear ache, +nasal congestion, post nasal drip,  CV:  No-   chest pain, orthopnea, PND, swelling in lower extremities, anasarca,                                                      dizziness, palpitations Resp: No-   shortness of breath with exertion or at  rest.              productive cough,  No non-productive cough,  No- coughing up of blood.              No-   change in color of mucus.  No- wheezing.   Skin: No-   rash or lesions. GI:  No-   heartburn, indigestion, abdominal pain, nausea, vomiting, diarrhea,                 change in bowel habits, loss of appetite GU: No-   dysuria, change in color of urine, no urgency or frequency.  No- flank pain. MS:  No-   joint pain or swelling.  No- decreased range of motion.  No- back pain. Neuro-     nothing unusual Psych:  No- change in mood or affect. No depression or anxiety.  No memory loss.  OBJ- Physical Exam    Medium build, not obese General- Alert, Oriented, Affect-appropriate, Distress- none acute Skin- rash-none, lesions- none, excoriation- none.  Lymphadenopathy- none Head- atraumatic            Eyes- Gross vision intact, PERRLA, conjunctivae and secretions clear            Ears- Hearing, canals-normal            Nose- Clear, no-Septal dev, mucus, polyps, erosion, + perforation             Throat-  Mallampati II-III , mucosa clear , drainage- none, tonsils- atrophic Neck- flexible , trachea midline, no stridor , thyroid nl, carotid no bruit Chest - symmetrical excursion , unlabored           Heart/CV- RRR-feels regular , no murmur , no gallop  , no rub, nl s1 s2                           - JVD- none , edema- none, stasis changes- none, varices- none           Lung- clear to P&A, wheeze- none, cough- none , dullness-none, rub- none           Chest wall- +pacemaker R Abd- tender-no, distended-no, bowel sounds-present, HSM- no Br/ Gen/ Rectal- Not done, not indicated Extrem- cyanosis- none, clubbing, none, atrophy- none, strength- nl Neuro- grossly intact to observation

## 2018-02-07 NOTE — Assessment & Plan Note (Signed)
Continues to benefit from CPAP with improved sleep.  We discussed alternative therapies, travel machines. Plan-continue CPAP 9.  When eligible for replacement we can change him to an AutoPap machine, probably 5-15.

## 2018-02-07 NOTE — Patient Instructions (Signed)
We  Can continue CPAP 9, or change to auto 5-15 when you are due for a replacement machine. Continue mask of choice, humidifier, supplies, Airview/ card.  You can ask [facility] when you will be eligible to replace your old machine, and what information they have about small travel CPAP machines.

## 2018-02-11 DIAGNOSIS — D225 Melanocytic nevi of trunk: Secondary | ICD-10-CM | POA: Diagnosis not present

## 2018-02-11 DIAGNOSIS — Z85828 Personal history of other malignant neoplasm of skin: Secondary | ICD-10-CM | POA: Diagnosis not present

## 2018-02-11 DIAGNOSIS — B353 Tinea pedis: Secondary | ICD-10-CM | POA: Diagnosis not present

## 2018-02-11 DIAGNOSIS — L814 Other melanin hyperpigmentation: Secondary | ICD-10-CM | POA: Diagnosis not present

## 2018-02-11 DIAGNOSIS — L821 Other seborrheic keratosis: Secondary | ICD-10-CM | POA: Diagnosis not present

## 2018-02-11 DIAGNOSIS — Z23 Encounter for immunization: Secondary | ICD-10-CM | POA: Diagnosis not present

## 2018-02-11 DIAGNOSIS — L570 Actinic keratosis: Secondary | ICD-10-CM | POA: Diagnosis not present

## 2018-02-12 ENCOUNTER — Other Ambulatory Visit: Payer: Self-pay | Admitting: Cardiovascular Disease

## 2018-02-12 ENCOUNTER — Other Ambulatory Visit: Payer: Self-pay | Admitting: Cardiology

## 2018-02-12 ENCOUNTER — Other Ambulatory Visit: Payer: Self-pay | Admitting: Neurology

## 2018-02-13 DIAGNOSIS — I480 Paroxysmal atrial fibrillation: Secondary | ICD-10-CM | POA: Diagnosis not present

## 2018-02-13 DIAGNOSIS — Z7901 Long term (current) use of anticoagulants: Secondary | ICD-10-CM | POA: Diagnosis not present

## 2018-03-18 DIAGNOSIS — R82998 Other abnormal findings in urine: Secondary | ICD-10-CM | POA: Diagnosis not present

## 2018-03-18 DIAGNOSIS — E7849 Other hyperlipidemia: Secondary | ICD-10-CM | POA: Diagnosis not present

## 2018-03-18 DIAGNOSIS — I10 Essential (primary) hypertension: Secondary | ICD-10-CM | POA: Diagnosis not present

## 2018-03-25 DIAGNOSIS — E291 Testicular hypofunction: Secondary | ICD-10-CM | POA: Diagnosis not present

## 2018-03-25 DIAGNOSIS — G3184 Mild cognitive impairment, so stated: Secondary | ICD-10-CM | POA: Diagnosis not present

## 2018-03-25 DIAGNOSIS — I480 Paroxysmal atrial fibrillation: Secondary | ICD-10-CM | POA: Diagnosis not present

## 2018-03-25 DIAGNOSIS — N183 Chronic kidney disease, stage 3 (moderate): Secondary | ICD-10-CM | POA: Diagnosis not present

## 2018-03-25 DIAGNOSIS — Z0000 Encounter for general adult medical examination without abnormal findings: Secondary | ICD-10-CM | POA: Diagnosis not present

## 2018-03-25 DIAGNOSIS — Z1339 Encounter for screening examination for other mental health and behavioral disorders: Secondary | ICD-10-CM | POA: Diagnosis not present

## 2018-03-25 DIAGNOSIS — I129 Hypertensive chronic kidney disease with stage 1 through stage 4 chronic kidney disease, or unspecified chronic kidney disease: Secondary | ICD-10-CM | POA: Diagnosis not present

## 2018-03-25 DIAGNOSIS — E7849 Other hyperlipidemia: Secondary | ICD-10-CM | POA: Diagnosis not present

## 2018-03-25 DIAGNOSIS — Z6831 Body mass index (BMI) 31.0-31.9, adult: Secondary | ICD-10-CM | POA: Diagnosis not present

## 2018-03-25 DIAGNOSIS — Z7901 Long term (current) use of anticoagulants: Secondary | ICD-10-CM | POA: Diagnosis not present

## 2018-03-25 DIAGNOSIS — Z1331 Encounter for screening for depression: Secondary | ICD-10-CM | POA: Diagnosis not present

## 2018-03-25 DIAGNOSIS — E668 Other obesity: Secondary | ICD-10-CM | POA: Diagnosis not present

## 2018-04-01 ENCOUNTER — Other Ambulatory Visit: Payer: Self-pay

## 2018-04-01 ENCOUNTER — Ambulatory Visit (INDEPENDENT_AMBULATORY_CARE_PROVIDER_SITE_OTHER): Payer: Medicare Other | Admitting: *Deleted

## 2018-04-01 DIAGNOSIS — I442 Atrioventricular block, complete: Secondary | ICD-10-CM

## 2018-04-01 DIAGNOSIS — Z1212 Encounter for screening for malignant neoplasm of rectum: Secondary | ICD-10-CM | POA: Diagnosis not present

## 2018-04-02 ENCOUNTER — Telehealth: Payer: Self-pay

## 2018-04-02 LAB — CUP PACEART REMOTE DEVICE CHECK
Brady Statistic RV Percent Paced: 99.63 %
Date Time Interrogation Session: 20200325133020
Implantable Lead Implant Date: 20090402
Implantable Lead Location: 753860
Implantable Lead Model: 5076
Implantable Pulse Generator Implant Date: 20180919
Lead Channel Pacing Threshold Amplitude: 0.75 V
Lead Channel Pacing Threshold Pulse Width: 0.4 ms
Lead Channel Sensing Intrinsic Amplitude: 12.875 mV
Lead Channel Sensing Intrinsic Amplitude: 12.875 mV
MDC IDC MSMT BATTERY REMAINING LONGEVITY: 122 mo
MDC IDC MSMT BATTERY VOLTAGE: 3.02 V
MDC IDC MSMT LEADCHNL RV IMPEDANCE VALUE: 342 Ohm
MDC IDC MSMT LEADCHNL RV IMPEDANCE VALUE: 380 Ohm
MDC IDC SET LEADCHNL RV PACING AMPLITUDE: 2.5 V
MDC IDC SET LEADCHNL RV PACING PULSEWIDTH: 0.4 ms
MDC IDC SET LEADCHNL RV SENSING SENSITIVITY: 4 mV

## 2018-04-02 NOTE — Telephone Encounter (Signed)
Spoke with patient to remind of missed remote transmission 

## 2018-04-07 ENCOUNTER — Encounter: Payer: Self-pay | Admitting: Cardiology

## 2018-04-07 NOTE — Progress Notes (Signed)
Remote pacemaker transmission.   

## 2018-04-24 DIAGNOSIS — Z7901 Long term (current) use of anticoagulants: Secondary | ICD-10-CM | POA: Diagnosis not present

## 2018-04-24 DIAGNOSIS — I480 Paroxysmal atrial fibrillation: Secondary | ICD-10-CM | POA: Diagnosis not present

## 2018-05-10 ENCOUNTER — Other Ambulatory Visit: Payer: Self-pay | Admitting: Cardiovascular Disease

## 2018-05-10 NOTE — Telephone Encounter (Signed)
k-dur refilled. 

## 2018-05-13 DIAGNOSIS — Z961 Presence of intraocular lens: Secondary | ICD-10-CM | POA: Diagnosis not present

## 2018-05-27 DIAGNOSIS — M19021 Primary osteoarthritis, right elbow: Secondary | ICD-10-CM | POA: Diagnosis not present

## 2018-05-27 DIAGNOSIS — M19041 Primary osteoarthritis, right hand: Secondary | ICD-10-CM | POA: Diagnosis not present

## 2018-05-28 DIAGNOSIS — I480 Paroxysmal atrial fibrillation: Secondary | ICD-10-CM | POA: Diagnosis not present

## 2018-05-28 DIAGNOSIS — Z7901 Long term (current) use of anticoagulants: Secondary | ICD-10-CM | POA: Diagnosis not present

## 2018-07-01 ENCOUNTER — Encounter: Payer: Medicare Other | Admitting: *Deleted

## 2018-07-02 ENCOUNTER — Telehealth: Payer: Self-pay

## 2018-07-02 NOTE — Telephone Encounter (Signed)
Left message for patient to remind of missed remote transmission.  

## 2018-07-03 DIAGNOSIS — M19021 Primary osteoarthritis, right elbow: Secondary | ICD-10-CM | POA: Diagnosis not present

## 2018-07-03 DIAGNOSIS — I480 Paroxysmal atrial fibrillation: Secondary | ICD-10-CM | POA: Diagnosis not present

## 2018-07-03 DIAGNOSIS — M19041 Primary osteoarthritis, right hand: Secondary | ICD-10-CM | POA: Diagnosis not present

## 2018-07-03 DIAGNOSIS — Z7901 Long term (current) use of anticoagulants: Secondary | ICD-10-CM | POA: Diagnosis not present

## 2018-07-07 ENCOUNTER — Telehealth: Payer: Self-pay | Admitting: Cardiovascular Disease

## 2018-07-07 NOTE — Telephone Encounter (Signed)
New message   Patient would like a call back about his missed remote transmission.

## 2018-07-07 NOTE — Telephone Encounter (Signed)
LMOVM to call my direct office number to discuss missed transmission.

## 2018-07-08 NOTE — Telephone Encounter (Signed)
I spoke with the pt wife she states they are at the [facility]. They will be back in [facility] July 8 and will do the transmission then.

## 2018-07-16 ENCOUNTER — Encounter: Payer: Medicare Other | Admitting: *Deleted

## 2018-07-16 ENCOUNTER — Other Ambulatory Visit: Payer: Self-pay

## 2018-07-16 ENCOUNTER — Encounter: Payer: Self-pay | Admitting: Neurology

## 2018-07-16 ENCOUNTER — Ambulatory Visit (INDEPENDENT_AMBULATORY_CARE_PROVIDER_SITE_OTHER): Payer: Medicare Other | Admitting: Neurology

## 2018-07-16 VITALS — BP 106/62 | HR 79 | Temp 97.3°F | Ht 70.5 in | Wt 200.1 lb

## 2018-07-16 DIAGNOSIS — R413 Other amnesia: Secondary | ICD-10-CM

## 2018-07-16 MED ORDER — MEMANTINE HCL 5 MG PO TABS: ORAL_TABLET | ORAL | 0 refills | Status: DC

## 2018-07-16 MED ORDER — DONEPEZIL HCL 10 MG PO TABS: 10.0000 mg | ORAL_TABLET | Freq: Every morning | ORAL | 3 refills | Status: DC

## 2018-07-16 NOTE — Patient Instructions (Signed)
We will start Namenda for the memory. Look out for dizziness on this medication.

## 2018-07-16 NOTE — Progress Notes (Signed)
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  The patient is on Aricept, he has been able to tolerate the 10 mg dose in the morning without having vivid dreams at night.  The patient comes in with his wife, she does not believe it has been a significant change in memory since last seen.  He still operates a Teacher, music, he has not had any safety concerns but his wife does most of the driving.  The patient is not reading as much as he used to, he spends much of the day watching TV.  He has some difficulty keeping up with characters and plots in the books.  The patient otherwise has not had any alteration in his activities of daily living.  He has not had any significant medical issues that have come up since last seen.  He had to come off of Celebrex secondary to chronic renal insufficiency.  Past Medical History:  Diagnosis Date  . Arthritis   . Atrial fibrillation ([place])   . Dysrhythmia    AF  . Hyperlipemia   . Hypertension   . Memory difficulty 12/16/2017  . Pacemaker   . Pacemaker   . Sinus bradycardia seen on cardiac monitor   . Sleep apnea    does use his cpap  . Wears glasses   . Wears hearing aid     Past Surgical History:  Procedure Laterality Date  . CARDIOVERSION     multiple times-pacer put in 2009  . COLONOSCOPY    . I&D EXTREMITY Left 04/20/2013   Procedure: LEFT HAND WOUND DEBRIDEMENT AND CLOSURE ;  Surgeon: Jolyn Nap, MD;  Location: [place];  Service: Orthopedics;  Laterality: Left;  . KNEE ARTHROSCOPY  2/10   left  . MASS EXCISION  01/29/2012   Procedure: EXCISION MASS;  Surgeon: Hessie Dibble, MD;  Location: [place];  Service: Orthopedics;  Laterality: Left;  excision of bone spur left hand  . METACARPAL OSTEOTOMY Left 01/13/2013   Procedure: LEFT  INDEX FINGER MCP RADIAL COLLATERAL REPAIR/RECONSTRUCTION VS METACARPAL ROTATIONAL OSTEOTOMY;  Surgeon: Jolyn Nap, MD;  Location: [place];  Service: Orthopedics;  Laterality: Left;  . PACEMAKER INSERTION  2009  . PPM GENERATOR CHANGEOUT N/A 09/26/2016   Procedure: PPM Generator Changeout;  Surgeon: Sanda Klein, MD;  Location: [place] [facility];  Service: Cardiovascular;  Laterality: N/A;  . SHOULDER ARTHROSCOPY  2004   left  . THUMB ARTHROSCOPY  2007   rt thunb mass  . TONSILLECTOMY      Family History  Problem Relation Age of Onset  . Dementia Mother   . Stroke Father   . Hypertension Brother   . Diabetes Brother   . Diabetes Maternal Grandmother   . Stroke Paternal Grandfather   . Emphysema Other   . Heart disease Other     Social history:  reports that he has never smoked. He has never used smokeless tobacco. He reports current alcohol use. He reports that he does not use drugs.    Allergies  Allergen Reactions  . Lipitor [Atorvastatin] Other (See Comments)    Muscle pain  . Penicillins Rash    Has patient had a PCN reaction causing immediate rash, facial/tongue/throat swelling, SOB or lightheadedness with hypotension: Unknown Has patient had  a PCN reaction causing severe rash involving mucus membranes or skin necrosis: Unknown Has patient had a PCN reaction that required hospitalization: No Has patient had a PCN reaction occurring within the last 10 years: No If all of the above answers are "NO", then may proceed with Cephalosporin use.   . Vytorin [Ezetimibe-Simvastatin] Other (See Comments)    Muscle pain    Medications:  Prior to Admission medications   Medication Sig Start Date End Date Taking? Authorizing Provider  amLODipine (NORVASC) 10 MG tablet Take 1 tablet by mouth daily. 12/25/16  Yes [provider]  Arginine 1000 MG TABS Take 1,000 mg by mouth daily.    Yes [provider]  aspirin EC 81 MG tablet Take 81 mg  by mouth at bedtime.   Yes [provider]  BYSTOLIC 20 MG TABS TAKE 2 TABLETS BY MOUTH EVERY DAY 02/12/18  Yes Croitoru, Mihai, MD  celecoxib (CELEBREX) 200 MG capsule Take 200 mg by mouth daily with breakfast.  01/09/13  Yes [provider]  chlorthalidone (HYGROTON) 25 MG tablet TAKE 1 TABLET BY MOUTH EVERY DAY 02/12/18  Yes Kilroy, Luke K, PA-C  Cholecalciferol (VITAMIN D3) 5000 UNITS TABS Take 5,000 Units by mouth daily.    Yes [provider]  donepezil (ARICEPT) 10 MG tablet TAKE 1 TABLET BY MOUTH EVERY MORNING 02/12/18  Yes Kathrynn Ducking, MD  ezetimibe (ZETIA) 10 MG tablet Take 1 tablet (10 mg total) by mouth daily. Patient taking differently: Take 10 mg by mouth daily with breakfast.  08/04/13  Yes Croitoru, Mihai, MD  fish oil-omega-3 fatty acids 1000 MG capsule Take 1 g by mouth 2 (two) times daily.    Yes [provider]  Glucosamine-Chondroitin (COSAMIN DS PO) Take by mouth daily with breakfast.   Yes [provider]  ibuprofen (ADVIL,MOTRIN) 200 MG tablet Take 400 mg by mouth every 8 (eight) hours as needed (for pain.).   Yes [provider]  Nebivolol HCl (BYSTOLIC) 20 MG TABS Take 2 tablets ( 40 mg ) daily 01/11/17  Yes Croitoru, Mihai, MD  niacin (SLO-NIACIN) 500 MG tablet Take 500 mg by mouth 2 (two) times daily with a meal.   Yes [provider]  olmesartan (BENICAR) 40 MG tablet Take 1 tablet by mouth daily. 12/19/16  Yes [provider]  potassium chloride SA (K-DUR) 20 MEQ tablet TAKE 1 TABLET BY MOUTH daily 05/10/18  Yes Croitoru, Mihai, MD  warfarin (COUMADIN) 4 MG tablet Take 4 mg by mouth daily with breakfast.    Yes [provider]    ROS:  Out of a complete 14 system review of symptoms, the patient complains only of the following symptoms, and all other reviewed systems are negative.  Memory problems Joint pains  Blood pressure 106/62, pulse 79, temperature (!) 97.3 F (36.3 C), temperature  source Temporal, height 5' 10.5" (1.791 m), weight 200 lb 2 oz (90.8 kg), SpO2 98 %.  Physical Exam  General: The patient is alert and cooperative at the time of the examination.  Skin: No significant peripheral edema is noted.   Neurologic Exam  Mental status: The patient is alert and oriented x 3 at the time of the examination. The Mini-Mental status examination done today shows a total score 23/30.   Cranial nerves: Facial symmetry is present. Speech is normal, no aphasia or dysarthria is noted. Extraocular movements are full. Visual fields are full.  Motor: The patient has good strength in all 4 extremities.  Sensory examination: Soft touch sensation is symmetric on the face, arms, and legs.  Coordination: The patient has good finger-nose-finger and heel-to-shin bilaterally.  Gait and station: The patient has a normal gait. Tandem gait is normal. Romberg is negative. No drift is seen.  Reflexes: Deep tendon reflexes are symmetric.   Assessment/Plan:  1.  Progressive memory disturbance  The patient is tolerating the Aricept well.  We will consider adding Namenda to the Aricept at this time, a prescription was given to the patient, he will call in 1 month for a maintenance dose prescription if he tolerates the drug well.  We will follow-up here in about 8 months.  The driving issue needs to be scrutinized closely.  Jill Alexanders MD 07/16/2018 10:25 AM  [facility] [address] [place], [place] [address]  Phone [phone] Fax [phone]

## 2018-07-24 DIAGNOSIS — Z7901 Long term (current) use of anticoagulants: Secondary | ICD-10-CM | POA: Diagnosis not present

## 2018-07-24 DIAGNOSIS — I480 Paroxysmal atrial fibrillation: Secondary | ICD-10-CM | POA: Diagnosis not present

## 2018-07-28 ENCOUNTER — Encounter: Payer: Self-pay | Admitting: Cardiology

## 2018-08-12 DIAGNOSIS — Z7901 Long term (current) use of anticoagulants: Secondary | ICD-10-CM | POA: Diagnosis not present

## 2018-08-12 DIAGNOSIS — I480 Paroxysmal atrial fibrillation: Secondary | ICD-10-CM | POA: Diagnosis not present

## 2018-08-13 ENCOUNTER — Ambulatory Visit (INDEPENDENT_AMBULATORY_CARE_PROVIDER_SITE_OTHER): Payer: Medicare Other | Admitting: *Deleted

## 2018-08-13 DIAGNOSIS — I442 Atrioventricular block, complete: Secondary | ICD-10-CM

## 2018-08-13 LAB — CUP PACEART REMOTE DEVICE CHECK
Battery Remaining Longevity: 115 mo
Battery Voltage: 3.02 V
Brady Statistic RV Percent Paced: 99.69 %
Date Time Interrogation Session: 20200805160849
Implantable Lead Implant Date: 20090402
Implantable Lead Location: 753860
Implantable Lead Model: 5076
Implantable Pulse Generator Implant Date: 20180919
Lead Channel Impedance Value: 323 Ohm
Lead Channel Impedance Value: 342 Ohm
Lead Channel Pacing Threshold Amplitude: 1 V
Lead Channel Pacing Threshold Pulse Width: 0.4 ms
Lead Channel Sensing Intrinsic Amplitude: 11.75 mV
Lead Channel Sensing Intrinsic Amplitude: 11.75 mV
Lead Channel Setting Pacing Amplitude: 2.5 V
Lead Channel Setting Pacing Pulse Width: 0.4 ms
Lead Channel Setting Sensing Sensitivity: 4 mV

## 2018-08-19 ENCOUNTER — Encounter: Payer: Self-pay | Admitting: Cardiology

## 2018-08-19 NOTE — Progress Notes (Signed)
Remote pacemaker transmission.   

## 2018-08-21 ENCOUNTER — Other Ambulatory Visit: Payer: Self-pay | Admitting: Neurology

## 2018-08-22 DIAGNOSIS — Z7901 Long term (current) use of anticoagulants: Secondary | ICD-10-CM | POA: Diagnosis not present

## 2018-09-26 DIAGNOSIS — G3184 Mild cognitive impairment, so stated: Secondary | ICD-10-CM | POA: Diagnosis not present

## 2018-09-26 DIAGNOSIS — Z7901 Long term (current) use of anticoagulants: Secondary | ICD-10-CM | POA: Diagnosis not present

## 2018-09-26 DIAGNOSIS — N183 Chronic kidney disease, stage 3 (moderate): Secondary | ICD-10-CM | POA: Diagnosis not present

## 2018-09-26 DIAGNOSIS — D6869 Other thrombophilia: Secondary | ICD-10-CM | POA: Diagnosis not present

## 2018-09-26 DIAGNOSIS — E785 Hyperlipidemia, unspecified: Secondary | ICD-10-CM | POA: Diagnosis not present

## 2018-09-26 DIAGNOSIS — I129 Hypertensive chronic kidney disease with stage 1 through stage 4 chronic kidney disease, or unspecified chronic kidney disease: Secondary | ICD-10-CM | POA: Diagnosis not present

## 2018-09-26 DIAGNOSIS — I480 Paroxysmal atrial fibrillation: Secondary | ICD-10-CM | POA: Diagnosis not present

## 2018-10-14 DIAGNOSIS — Z7901 Long term (current) use of anticoagulants: Secondary | ICD-10-CM | POA: Diagnosis not present

## 2018-10-14 DIAGNOSIS — Z23 Encounter for immunization: Secondary | ICD-10-CM | POA: Diagnosis not present

## 2018-10-14 DIAGNOSIS — I480 Paroxysmal atrial fibrillation: Secondary | ICD-10-CM | POA: Diagnosis not present

## 2018-11-12 ENCOUNTER — Encounter: Payer: Medicare Other | Admitting: *Deleted

## 2018-11-20 ENCOUNTER — Other Ambulatory Visit: Payer: Self-pay | Admitting: Cardiology

## 2018-11-26 ENCOUNTER — Other Ambulatory Visit: Payer: Self-pay | Admitting: Neurology

## 2019-01-12 ENCOUNTER — Encounter (INDEPENDENT_AMBULATORY_CARE_PROVIDER_SITE_OTHER): Payer: Self-pay

## 2019-01-12 ENCOUNTER — Ambulatory Visit (INDEPENDENT_AMBULATORY_CARE_PROVIDER_SITE_OTHER): Payer: Medicare Other | Admitting: Cardiovascular Disease

## 2019-01-12 ENCOUNTER — Other Ambulatory Visit: Payer: Self-pay

## 2019-01-12 ENCOUNTER — Encounter: Payer: Self-pay | Admitting: Cardiovascular Disease

## 2019-01-12 VITALS — BP 104/66 | HR 72 | Temp 96.4°F | Ht 70.5 in | Wt 204.0 lb

## 2019-01-12 DIAGNOSIS — E7800 Pure hypercholesterolemia, unspecified: Secondary | ICD-10-CM

## 2019-01-12 DIAGNOSIS — Z950 Presence of cardiac pacemaker: Secondary | ICD-10-CM

## 2019-01-12 DIAGNOSIS — I442 Atrioventricular block, complete: Secondary | ICD-10-CM | POA: Diagnosis not present

## 2019-01-12 DIAGNOSIS — G4733 Obstructive sleep apnea (adult) (pediatric): Secondary | ICD-10-CM

## 2019-01-12 DIAGNOSIS — Z7901 Long term (current) use of anticoagulants: Secondary | ICD-10-CM

## 2019-01-12 DIAGNOSIS — I4821 Permanent atrial fibrillation: Secondary | ICD-10-CM

## 2019-01-12 DIAGNOSIS — I10 Essential (primary) hypertension: Secondary | ICD-10-CM

## 2019-01-12 MED ORDER — CHLORTHALIDONE 25 MG PO TABS: 12.5000 mg | ORAL_TABLET | Freq: Every day | ORAL | 3 refills | Status: DC

## 2019-01-12 NOTE — Progress Notes (Signed)
Cardiology Office Note    Date:  01/14/2019   ID:  Dean Morgan, Dean Morgan, DOB 07/03/1931, MRN [id number]  PCP:  Marton Redwood, MD  Cardiologist:   Sanda Klein, MD   Chief Complaint  Patient presents with  . Follow-up    12 months.    History of Present Illness:  Dean Morgan, Dean Morgan is a 86 y.o. male with complete heart block and permanent atrial fibrillation here for pacemaker follow-up. He also has obstructive sleep apnea, hypertension and hyperlipidemia but does not have known coronary disease or other structural heart problems.  Feels well. The patient specifically denies any chest pain at rest exertion, dyspnea at rest or with exertion, orthopnea, paroxysmal nocturnal dyspnea, syncope, palpitations, focal neurological deficits, intermittent claudication, lower extremity edema, unexplained weight gain, cough, hemoptysis or wheezing. Denies falls, bleeding, focal neurological symptoms.  He has occasional dizziness, pattern suggests orthostatic hypotension.  Pacemaker interrogation shows normal device function. He is pacemaker dependent without any underlying escape rhythm. His Medtronic Azure single-chamber pacemaker generator was implanted in September 2018, the leads were implanted in 2009. He is pacemaker dependent - there are no detectable R waves. 99.7 paced, 0.3% PVCs. As before, he has rare episodes of brief NSVT (twice in last 12 months, longest 11 beats).    Memory issues remain mild.   Past Medical History:  Diagnosis Date  . Arthritis   . Atrial fibrillation ([place])   . Dysrhythmia    AF  . Hyperlipemia   . Hypertension   . Memory difficulty 12/16/2017  . Pacemaker   . Pacemaker   . Sinus bradycardia seen on cardiac monitor   . Sleep apnea    does use his cpap  . Wears glasses   . Wears hearing aid     Past Surgical History:  Procedure Laterality Date  . CARDIOVERSION     multiple times-pacer put in 2009  . COLONOSCOPY    . I & D EXTREMITY Left  04/20/2013   Procedure: LEFT HAND WOUND DEBRIDEMENT AND CLOSURE ;  Surgeon: Jolyn Nap, MD;  Location: [place];  Service: Orthopedics;  Laterality: Left;  . KNEE ARTHROSCOPY  2/10   left  . MASS EXCISION  01/29/2012   Procedure: EXCISION MASS;  Surgeon: Hessie Dibble, MD;  Location: [place];  Service: Orthopedics;  Laterality: Left;  excision of bone spur left hand  . METACARPAL OSTEOTOMY Left 01/13/2013   Procedure: LEFT INDEX FINGER MCP RADIAL COLLATERAL REPAIR/RECONSTRUCTION VS METACARPAL ROTATIONAL OSTEOTOMY;  Surgeon: Jolyn Nap, MD;  Location: [place];  Service: Orthopedics;  Laterality: Left;  . PACEMAKER INSERTION  2009  . PPM GENERATOR CHANGEOUT N/A 09/26/2016   Procedure: PPM Generator Changeout;  Surgeon: Sanda Klein, MD;  Location: [place] [facility];  Service: Cardiovascular;  Laterality: N/A;  . SHOULDER ARTHROSCOPY  2004   left  . THUMB ARTHROSCOPY  2007   rt thunb mass  . TONSILLECTOMY      Current Medications: Outpatient Medications Prior to Visit  Medication Sig Dispense Refill  . amLODipine (NORVASC) 10 MG tablet Take 1 tablet by mouth daily.    . Arginine 1000 MG TABS Take 1,000 mg by mouth daily.     Marland Kitchen aspirin EC 81 MG tablet Take 81 mg by mouth at bedtime.    Marland Kitchen BYSTOLIC 20 MG TABS TAKE 2 TABLETS BY MOUTH EVERY DAY 60 tablet 11  . Cholecalciferol (VITAMIN D3) 5000 UNITS TABS Take 5,000  Units by mouth daily.     Marland Kitchen donepezil (ARICEPT) 10 MG tablet Take 1 tablet (10 mg total) by mouth every morning. 90 tablet 3  . ezetimibe (ZETIA) 10 MG tablet Take 1 tablet (10 mg total) by mouth daily. (Patient taking differently: Take 10 mg by mouth daily with breakfast. ) 28 tablet 0  . fish oil-omega-3 fatty acids 1000 MG capsule Take 1 g by mouth 2 (two) times daily.     . Glucosamine-Chondroitin (COSAMIN DS PO) Take by mouth daily with breakfast.    . ibuprofen (ADVIL,MOTRIN) 200 MG tablet Take 400 mg by mouth  every 8 (eight) hours as needed (for pain.).    Marland Kitchen memantine (NAMENDA) 10 MG tablet TAKE 1 TABLET BY MOUTH 2 TIMES DAILY 60 tablet 5  . Nebivolol HCl (BYSTOLIC) 20 MG TABS Take 2 tablets ( 40 mg ) daily 30 tablet 6  . niacin (SLO-NIACIN) 500 MG tablet Take 500 mg by mouth 2 (two) times daily with a meal.    . olmesartan (BENICAR) 40 MG tablet Take 1 tablet by mouth daily.    . potassium chloride SA (K-DUR) 20 MEQ tablet TAKE 1 TABLET BY MOUTH daily 90 tablet 3  . warfarin (COUMADIN) 4 MG tablet Take 4 mg by mouth daily with breakfast.     . celecoxib (CELEBREX) 200 MG capsule Take 200 mg by mouth daily with breakfast.     . chlorthalidone (HYGROTON) 25 MG tablet TAKE 1 TABLET BY MOUTH EVERY DAY 90 tablet 3   No facility-administered medications prior to visit.     Allergies:   Lipitor [atorvastatin], Penicillins, and Vytorin [ezetimibe-simvastatin]   Social History   Socioeconomic History  . Marital status: Married    Spouse name: Acel Easter  . Number of children: 2  . Years of education: Dean Morgan  . Highest education level: Not on file  Occupational History  . Occupation: Dean Morgan-Retired  Tobacco Use  . Smoking status: Never Smoker  . Smokeless tobacco: Never Used  Substance and Sexual Activity  . Alcohol use: Yes    Alcohol/week: 0.0 standard drinks    Comment: wine 3-4 times a week  . Drug use: No  . Sexual activity: Not on file  Other Topics Concern  . Not on file  Social History Narrative   Lives with wife   Right handed    Caffeine use: 1-2 cups coffee per day   Social Determinants of Health   Financial Resource Strain:   . Difficulty of Paying Living Expenses: Not on file  Food Insecurity:   . Worried About Charity fundraiser in the Last Year: Not on file  . Ran Out of Food in the Last Year: Not on file  Transportation Needs:   . Lack of Transportation (Medical): Not on file  . Lack of Transportation (Non-Medical): Not on file  Physical Activity:   . Days of  Exercise per Week: Not on file  . Minutes of Exercise per Session: Not on file  Stress:   . Feeling of Stress : Not on file  Social Connections:   . Frequency of Communication with Friends and Family: Not on file  . Frequency of Social Gatherings with Friends and Family: Not on file  . Attends Religious Services: Not on file  . Active Member of Clubs or Organizations: Not on file  . Attends Archivist Meetings: Not on file  . Marital Status: Not on file     Family History:  The patient's  family history includes Dementia in his mother; Diabetes in his brother and maternal grandmother; Emphysema in an other family member; Heart disease in an other family member; Hypertension in his brother; Stroke in his father and paternal grandfather.   ROS:   Please see the history of present illness.    ROS All other systems are reviewed and are negative.   PHYSICAL EXAM:   VS:  BP 104/66 (BP Location: Right Arm, Patient Position: Sitting, Cuff Size: Normal)   Pulse 72   Temp (!) 96.4 F (35.8 C)   Ht 5' 10.5" (1.791 m)   Wt 204 lb (92.5 kg)   BMI 28.86 kg/m     General: Alert, oriented x3, no distress, appears well Head: no evidence of trauma, PERRL, EOMI, no exophtalmos or lid lag, no myxedema, no xanthelasma; normal ears, nose and oropharynx Neck: normal jugular venous pulsations and no hepatojugular reflux; brisk carotid pulses without delay and no carotid bruits Chest: clear to auscultation, no signs of consolidation by percussion or palpation, normal fremitus, symmetrical and full respiratory excursions Cardiovascular: normal position and quality of the apical impulse, regular rhythm, normal first and paradoxically split second heart sounds, no murmurs, rubs or gallops Abdomen: no tenderness or distention, no masses by palpation, no abnormal pulsatility or arterial bruits, normal bowel sounds, no hepatosplenomegaly Extremities: no clubbing, cyanosis or edema; 2+ radial, ulnar and  brachial pulses bilaterally; 2+ right femoral, posterior tibial and dorsalis pedis pulses; 2+ left femoral, posterior tibial and dorsalis pedis pulses; no subclavian or femoral bruits Neurological: grossly nonfocal Psych: Normal mood and affect  Wt Readings from Last 3 Encounters:  01/12/19 204 lb (92.5 kg)  07/16/18 200 lb 2 oz (90.8 kg)  02/07/18 203 lb 6.4 oz (92.3 kg)     Studies/Labs Reviewed:   EKG:  EKG is not ordered today. It shows AFib, 100% V paced.  Lipid Panel    Component Value Date/Time   CHOL 164 06/10/2012 0815   TRIG 94 06/10/2012 0815   HDL 64 06/10/2012 0815   CHOLHDL 2.6 06/10/2012 0815   VLDL 19 06/10/2012 0815   LDLCALC 81 06/10/2012 0815   03/18/2018 Chol 207, HDL 52, LDL 125, TG 150 Creat 1.5, Hgb 13.8  ASSESSMENT:    1. Atrial fibrillation, permanent ([place])   2. Long term current use of anticoagulant   3. Complete heart block ([place])   4. Pacemaker   5. Obstructive sleep apnea   6. Pure hypercholesterolemia   7. Essential hypertension      PLAN:  In order of problems listed above:  1. AFib: permanent. CHADSVasc 3-5 (age 76, HTN, questionable TIA in 2002).  2. Anticoagulation: no bleeding problems 3. CHB: Pacemaker dependent. 4. PPM: Normal device function.  Remote downloads every 3 months and yearly office visits. 5. OSA: Compliant with CPAP with good response to therapy. 6. HLP: LDL mildly elevated at 125.  He is intolerant to statins.  On Zetia.  No known vascular disease.  I do not think it is worth starting PCSK9 inhibitors for primary prevention in this 86 year old gentleman. 7. HTN: controlled, quite low. Orthostatic dizziness. Reduce diuretic dose.    Medication Adjustments/Labs and Tests Ordered: Current medicines are reviewed at length with the patient today.  Concerns regarding medicines are outlined above.  Medication changes, Labs and Tests ordered today are listed in the Patient Instructions below. Patient Instructions   Medication Instructions:  DECREASE the Chlorthalidone to 12.5 mg once daily (half a tablet)  *If you need  a refill on your cardiac medications before your next appointment, please call your pharmacy*  Lab Work: None ordered If you have labs (blood work) drawn today and your tests are completely normal, you will receive your results only by: Marland Kitchen MyChart Message (if you have MyChart) OR . A paper copy in the mail If you have any lab test that is abnormal or we need to change your treatment, we will call you to review the results.  Testing/Procedures: None ordered  Follow-Up: At [facility], you and your health needs are our priority.  As part of our continuing mission to provide you with exceptional heart care, we have created designated Provider Care Teams.  These Care Teams include your primary Cardiologist (physician) and Advanced Practice Providers (APPs -  Physician Assistants and Nurse Practitioners) who all work together to provide you with the care you need, when you need it.  Your next appointment:   12 month(s)  The format for your next appointment:   In Person  Provider:   Sanda Klein, MD  Dr. Sallyanne Kuster would like you to check your blood pressure DAILY for the next 3 weeks.  Keep a journal of these daily BP and heart rate readings and call our office or send a message through [facility] with the results.      Signed, Sanda Klein, MD  01/14/2019 3:55 PM    [address], [place], [place]  [address] Phone: [phone]; Fax: [phone]

## 2019-01-12 NOTE — Patient Instructions (Addendum)
Medication Instructions:  DECREASE the Chlorthalidone to 12.5 mg once daily (half a tablet)  *If you need a refill on your cardiac medications before your next appointment, please call your pharmacy*  Lab Work: None ordered If you have labs (blood work) drawn today and your tests are completely normal, you will receive your results only by: Marland Kitchen MyChart Message (if you have MyChart) OR . A paper copy in the mail If you have any lab test that is abnormal or we need to change your treatment, we will call you to review the results.  Testing/Procedures: None ordered  Follow-Up: At [facility], you and your health needs are our priority.  As part of our continuing mission to provide you with exceptional heart care, we have created designated Provider Care Teams.  These Care Teams include your primary Cardiologist (physician) and Advanced Practice Providers (APPs -  Physician Assistants and Nurse Practitioners) who all work together to provide you with the care you need, when you need it.  Your next appointment:   12 month(s)  The format for your next appointment:   In Person  Provider:   Sanda Klein, MD  Dr. Sallyanne Kuster would like you to check your blood pressure DAILY for the next 3 weeks.  Keep a journal of these daily BP and heart rate readings and call our office or send a message through [facility] with the results.

## 2019-01-21 DIAGNOSIS — Z23 Encounter for immunization: Secondary | ICD-10-CM | POA: Diagnosis not present

## 2019-01-21 DIAGNOSIS — L821 Other seborrheic keratosis: Secondary | ICD-10-CM | POA: Diagnosis not present

## 2019-01-21 DIAGNOSIS — D225 Melanocytic nevi of trunk: Secondary | ICD-10-CM | POA: Diagnosis not present

## 2019-01-21 DIAGNOSIS — L570 Actinic keratosis: Secondary | ICD-10-CM | POA: Diagnosis not present

## 2019-01-21 DIAGNOSIS — D485 Neoplasm of uncertain behavior of skin: Secondary | ICD-10-CM | POA: Diagnosis not present

## 2019-01-21 DIAGNOSIS — C44622 Squamous cell carcinoma of skin of right upper limb, including shoulder: Secondary | ICD-10-CM | POA: Diagnosis not present

## 2019-01-21 DIAGNOSIS — Z85828 Personal history of other malignant neoplasm of skin: Secondary | ICD-10-CM | POA: Diagnosis not present

## 2019-01-21 DIAGNOSIS — C44629 Squamous cell carcinoma of skin of left upper limb, including shoulder: Secondary | ICD-10-CM | POA: Diagnosis not present

## 2019-01-21 DIAGNOSIS — L814 Other melanin hyperpigmentation: Secondary | ICD-10-CM | POA: Diagnosis not present

## 2019-01-21 DIAGNOSIS — L309 Dermatitis, unspecified: Secondary | ICD-10-CM | POA: Diagnosis not present

## 2019-01-27 DIAGNOSIS — M25571 Pain in right ankle and joints of right foot: Secondary | ICD-10-CM | POA: Diagnosis not present

## 2019-01-27 DIAGNOSIS — M79671 Pain in right foot: Secondary | ICD-10-CM | POA: Diagnosis not present

## 2019-01-27 DIAGNOSIS — M79674 Pain in right toe(s): Secondary | ICD-10-CM | POA: Diagnosis not present

## 2019-02-05 ENCOUNTER — Encounter: Payer: Self-pay | Admitting: Internal Medicine

## 2019-02-05 ENCOUNTER — Other Ambulatory Visit: Payer: Self-pay | Admitting: *Deleted

## 2019-02-05 MED ORDER — AMLODIPINE BESYLATE 5 MG PO TABS: 5.0000 mg | ORAL_TABLET | Freq: Every day | ORAL | 5 refills | Status: DC

## 2019-02-09 ENCOUNTER — Ambulatory Visit (INDEPENDENT_AMBULATORY_CARE_PROVIDER_SITE_OTHER): Payer: Medicare Other | Admitting: Internal Medicine

## 2019-02-09 ENCOUNTER — Other Ambulatory Visit: Payer: Self-pay

## 2019-02-09 ENCOUNTER — Encounter: Payer: Self-pay | Admitting: Internal Medicine

## 2019-02-09 DIAGNOSIS — I4821 Permanent atrial fibrillation: Secondary | ICD-10-CM | POA: Diagnosis not present

## 2019-02-09 DIAGNOSIS — G4733 Obstructive sleep apnea (adult) (pediatric): Secondary | ICD-10-CM | POA: Diagnosis not present

## 2019-02-09 NOTE — Patient Instructions (Signed)
We can continue CPAP 9, mask of choice, humidiffier, supplies, Airview/ card  You can ask your DME company, [employer], how old your machine is and when you would be eligible to have it replaced.   Please call if we can help

## 2019-02-09 NOTE — Progress Notes (Signed)
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  Wife comes with him and confirms that he uses CPAP every night, does not snore and seems to be doing fine.  Nasal pillows mask.  He is comfortable with current pressure.  We discussed potential conversion to AutoPap when he replaces his old machine.  We discussed availability of smaller travel machines.    02/09/19- Virtual Visit via Telephone Note  I connected with Dean Morgan, Dean Morgan on 02/11/19 at  1:30 PM EST by telephone and verified that I am speaking with the correct person using two identifiers.  Location: Patient: H Provider: O   I discussed the limitations, risks, security and privacy concerns of performing an evaluation and management service by telephone and the availability of in person appointments. I also discussed with the patient that there may be a patient responsible charge related to this service. The patient expressed understanding and agreed to proceed.   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 [place] 2 machines[place] [facility] Says he uses every night and wife tells him he is "doing fine" with CPAP    Observations/Objective:   Assessment and Plan: OSA- benefits. They [place] ask DME how old machine is. Continue 9 cwp. AFib- cardiology continues to follow with no new events  Follow Up Instructions: 1 year   I discussed the assessment and treatment plan with the patient. The patient was provided an opportunity to ask questions and all were answered. The patient agreed with the plan and demonstrated an understanding of the instructions.   The patient was advised to call back or seek an in-person evaluation if the symptoms worsen or if the condition fails to improve as anticipated.  I provided 17 minutes of non-face-to-face time during this encounter.   Baird Lyons, MD  .   ROS-see HPI              + = positive Constitutional:   No-   weight loss, night sweats, fevers, chills, fatigue, lassitude. HEENT:   No-  headaches, difficulty swallowing, tooth/dental problems, sore throat,       No-  sneezing, itching, ear ache, +nasal congestion, post nasal drip,  CV:  No-   chest pain, orthopnea, PND, swelling in lower extremities, anasarca,                                                      dizziness, palpitations Resp: No-   shortness of breath with exertion or at rest.              productive cough,  No non-productive cough,  No- coughing up of blood.              No-   change in color of mucus.  No- wheezing.  Skin: No-   rash or lesions. GI:  No-   heartburn, indigestion, abdominal pain, nausea, vomiting, diarrhea,                 change in bowel habits, loss of appetite GU: No-   dysuria, change in color of urine, no urgency or frequency.  No- flank pain. MS:  No-   joint pain or swelling.  No- decreased range of motion.  No- back pain. Neuro-     nothing unusual Psych:  No- change in mood or affect. No depression or anxiety.  No memory loss.  OBJ- Physical Exam    Medium build, not obese General- Alert, Oriented, Affect-appropriate, Distress- none acute Skin- rash-none,  lesions- none, excoriation- none.  Lymphadenopathy- none Head- atraumatic            Eyes- Gross vision intact, PERRLA, conjunctivae and secretions clear            Ears- Hearing, canals-normal            Nose- Clear, no-Septal dev, mucus, polyps, erosion, + perforation             Throat- Mallampati II-III , mucosa clear , drainage- none, tonsils- atrophic Neck- flexible , trachea midline, no stridor , thyroid nl, carotid no bruit Chest - symmetrical excursion , unlabored           Heart/CV- RRR-feels regular , no murmur , no gallop  , no rub, nl s1 s2                           - JVD- none , edema- none, stasis changes- none, varices- none           Lung- clear to P&A, wheeze- none, cough- none , dullness-none, rub- none           Chest wall- +pacemaker R Abd- tender-no, distended-no, bowel sounds-present, HSM- no Br/ Gen/ Rectal- Not done, not indicated Extrem- cyanosis- none, clubbing, none, atrophy- none, strength- nl Neuro- grossly intact to observation

## 2019-02-10 ENCOUNTER — Other Ambulatory Visit: Payer: Self-pay | Admitting: Neurology

## 2019-02-10 ENCOUNTER — Other Ambulatory Visit: Payer: Self-pay | Admitting: Cardiovascular Disease

## 2019-02-11 ENCOUNTER — Encounter: Payer: Self-pay | Admitting: Internal Medicine

## 2019-02-11 NOTE — Assessment & Plan Note (Signed)
Benefits from CPAP. Comfortable and compliant. 2 machines in different locations.  Plan- continue CPAP 9. When machine is replaced anticipate change to auto 5-15

## 2019-02-11 NOTE — Assessment & Plan Note (Signed)
Cardiology continues to follow. He denies new cardiac events.

## 2019-02-12 ENCOUNTER — Telehealth: Payer: Self-pay

## 2019-02-12 DIAGNOSIS — M25551 Pain in right hip: Secondary | ICD-10-CM | POA: Diagnosis not present

## 2019-02-12 DIAGNOSIS — M545 Low back pain: Secondary | ICD-10-CM | POA: Diagnosis not present

## 2019-02-12 NOTE — Telephone Encounter (Signed)
Spoke with patient to remind of missed remote transmission 

## 2019-02-13 DIAGNOSIS — C44629 Squamous cell carcinoma of skin of left upper limb, including shoulder: Secondary | ICD-10-CM | POA: Diagnosis not present

## 2019-02-13 DIAGNOSIS — C44622 Squamous cell carcinoma of skin of right upper limb, including shoulder: Secondary | ICD-10-CM | POA: Diagnosis not present

## 2019-02-17 ENCOUNTER — Other Ambulatory Visit (HOSPITAL_COMMUNITY): Payer: Self-pay | Admitting: Orthopedic Surgery

## 2019-02-17 DIAGNOSIS — M545 Low back pain, unspecified: Secondary | ICD-10-CM

## 2019-02-24 ENCOUNTER — Ambulatory Visit (HOSPITAL_COMMUNITY)
Admission: RE | Admit: 2019-02-24 | Discharge: 2019-02-24 | Disposition: A | Discharge status: Home / Self Care | Payer: Medicare Other | Source: Ambulatory Visit | Attending: Orthopedic Surgery | Admitting: Orthopedic Surgery

## 2019-02-24 ENCOUNTER — Other Ambulatory Visit: Payer: Self-pay

## 2019-02-24 DIAGNOSIS — M545 Low back pain, unspecified: Secondary | ICD-10-CM

## 2019-02-26 ENCOUNTER — Ambulatory Visit: Payer: Medicare Other | Admitting: Neurology

## 2019-03-03 DIAGNOSIS — M19071 Primary osteoarthritis, right ankle and foot: Secondary | ICD-10-CM | POA: Diagnosis not present

## 2019-03-10 ENCOUNTER — Ambulatory Visit (INDEPENDENT_AMBULATORY_CARE_PROVIDER_SITE_OTHER): Payer: Medicare Other | Admitting: Neurology

## 2019-03-10 ENCOUNTER — Encounter: Payer: Self-pay | Admitting: Neurology

## 2019-03-10 ENCOUNTER — Other Ambulatory Visit: Payer: Self-pay

## 2019-03-10 VITALS — BP 117/77 | HR 84 | Temp 97.3°F | Ht 67.0 in | Wt 203.0 lb

## 2019-03-10 DIAGNOSIS — R413 Other amnesia: Secondary | ICD-10-CM

## 2019-03-10 MED ORDER — MEMANTINE HCL 10 MG PO TABS: 10.0000 mg | ORAL_TABLET | Freq: Two times a day (BID) | ORAL | 11 refills | Status: AC

## 2019-03-10 MED ORDER — DONEPEZIL HCL 10 MG PO TABS: 10.0000 mg | ORAL_TABLET | Freq: Every morning | ORAL | 11 refills | Status: AC

## 2019-03-10 NOTE — Patient Instructions (Signed)
It was great to meet you both today! Continue Aricept and Namenda Memory test was 23/30 today, stable from last time Continue seeing Dr. Brigitte Pulse, return here as needed

## 2019-03-10 NOTE — Progress Notes (Signed)
I have read the note, and I agree with the clinical assessment and plan.  Philipe Laswell K Xitlaly Ault   

## 2019-03-10 NOTE — Progress Notes (Signed)
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  He takes 10 mg of Aricept in the morning, he no longer has bad dreams.  He is tolerating Namenda without side effect.  He indicates his memory is overall stable, no major changes.  He may have good and bad days.  He drives a car, only very short distances, has not gotten lost, his wife mostly drives.  His wife closely monitors this.  He mostly manages medications, but his wife oversees this.  He has not had any falls.  He says he stays busy during the day, spend times [place] and here.  His overall health has been good.  He presents today for follow-up accompanied by his wife.  He denies any new problems or concerns.  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  The patient is on Aricept, he has been able to tolerate the 10 mg dose in the morning without having vivid dreams at night.  The patient comes in with his wife, she does not believe it has been a significant change in memory since last seen.  He still operates a Teacher, music, he has not had any safety concerns but his wife does most of the driving.  The patient is not reading as much as he used to, he spends much of the day watching TV.  He has some difficulty keeping up with characters and plots in the books.  The patient otherwise has not had any alteration in his activities of daily living.  He has not had any significant medical issues that have come up since last seen.  He had to come off of Celebrex secondary to chronic renal insufficiency.   REVIEW OF SYSTEMS: Out of a complete 14 system review of symptoms, the patient complains only of the following symptoms, and all other reviewed systems are negative.  Memory  loss  ALLERGIES: Allergies  Allergen Reactions  . Lipitor [Atorvastatin] Other (See Comments)    Muscle pain  . Penicillins Rash    Has patient had a PCN reaction causing immediate rash, facial/tongue/throat swelling, SOB or lightheadedness with hypotension: Unknown Has patient had a PCN reaction causing severe rash involving mucus membranes or skin necrosis: Unknown Has patient had a PCN reaction that required hospitalization: No Has patient had a PCN reaction occurring within the last 10 years: No If all of the above answers are "NO", then may proceed with Cephalosporin use.   . Vytorin [Ezetimibe-Simvastatin] Other (See Comments)    Muscle pain    HOME MEDICATIONS: Outpatient Medications Prior to Visit  Medication Sig Dispense Refill  . acetaminophen (TYLENOL) 325 MG tablet Take 650 mg by mouth every 6 (six) hours as needed. OTC for arthritis.    Marland Kitchen amLODipine (NORVASC) 5 MG tablet Take 1 tablet (5 mg total) by mouth daily. 30 tablet 5  . apixaban (ELIQUIS) 5 MG TABS tablet Take 5 mg by mouth 2 (two) times daily.    . Arginine 1000 MG TABS Take 1,000 mg by mouth daily.     Marland Kitchen aspirin EC 81 MG tablet Take 81 mg by mouth at bedtime.    Marland Kitchen BYSTOLIC 20 MG TABS TAKE 2 TABLETS BY MOUTH EVERY DAY 60 tablet 11  . chlorthalidone (HYGROTON) 25 MG tablet  Take 0.5 tablets (12.5 mg total) by mouth daily. 45 tablet 3  . Cholecalciferol (VITAMIN D3) 5000 UNITS TABS Take 5,000 Units by mouth daily.     Marland Kitchen donepezil (ARICEPT) 10 MG tablet Take 1 tablet (10 mg total) by mouth every morning. 90 tablet 3  . ezetimibe (ZETIA) 10 MG tablet Take 1 tablet (10 mg total) by mouth daily. (Patient taking differently: Take 10 mg by mouth daily with breakfast. ) 28 tablet 0  . fish oil-omega-3 fatty acids 1000 MG capsule Take 1 g by mouth 2 (two) times daily.     . Glucosamine-Chondroitin (COSAMIN DS PO) Take by mouth daily with breakfast.    . memantine (NAMENDA) 10 MG tablet TAKE 1 TABLET BY MOUTH 2 TIMES DAILY  60 tablet 5  . niacin (SLO-NIACIN) 500 MG tablet Take 500 mg by mouth 2 (two) times daily with a meal.    . olmesartan (BENICAR) 40 MG tablet Take 1 tablet by mouth daily.    . potassium chloride SA (K-DUR) 20 MEQ tablet TAKE 1 TABLET BY MOUTH daily 90 tablet 3  . ibuprofen (ADVIL,MOTRIN) 200 MG tablet Take 400 mg by mouth every 8 (eight) hours as needed (for pain.).     No facility-administered medications prior to visit.    PAST MEDICAL HISTORY: Past Medical History:  Diagnosis Date  . Arthritis   . Atrial fibrillation ([place])   . Dysrhythmia    AF  . Hyperlipemia   . Hypertension   . Memory difficulty 12/16/2017  . Pacemaker   . Pacemaker   . Sinus bradycardia seen on cardiac monitor   . Sleep apnea    does use his cpap  . Wears glasses   . Wears hearing aid     PAST SURGICAL HISTORY: Past Surgical History:  Procedure Laterality Date  . CARDIOVERSION     multiple times-pacer put in 2009  . COLONOSCOPY    . I & D EXTREMITY Left 04/20/2013   Procedure: LEFT HAND WOUND DEBRIDEMENT AND CLOSURE ;  Surgeon: Jolyn Nap, MD;  Location: [facility];  Service: Orthopedics;  Laterality: Left;  . KNEE ARTHROSCOPY  2/10   left  . MASS EXCISION  01/29/2012   Procedure: EXCISION MASS;  Surgeon: Hessie Dibble, MD;  Location: [facility];  Service: Orthopedics;  Laterality: Left;  excision of bone spur left hand  . METACARPAL OSTEOTOMY Left 01/13/2013   Procedure: LEFT INDEX FINGER MCP RADIAL COLLATERAL REPAIR/RECONSTRUCTION VS METACARPAL ROTATIONAL OSTEOTOMY;  Surgeon: Jolyn Nap, MD;  Location: [facility];  Service: Orthopedics;  Laterality: Left;  . PACEMAKER INSERTION  2009  . PPM GENERATOR CHANGEOUT N/A 09/26/2016   Procedure: PPM Generator Changeout;  Surgeon: Sanda Klein, MD;  Location: [facility];  Service: Cardiovascular;  Laterality: N/A;  . SHOULDER ARTHROSCOPY  2004   left  . THUMB ARTHROSCOPY  2007   rt thunb  mass  . TONSILLECTOMY      FAMILY HISTORY: Family History  Problem Relation Age of Onset  . Dementia Mother   . Stroke Father   . Hypertension Brother   . Diabetes Brother   . Diabetes Maternal Grandmother   . Stroke Paternal Grandfather   . Emphysema Other   . Heart disease Other     SOCIAL HISTORY: Social History   Socioeconomic History  . Marital status: Married    Spouse name: Utsav Kaehler  . Number of children: 2  . Years of education:  [name]  . Highest education level: Not on file  Occupational History  . Occupation: [name]-Retired  Tobacco Use  . Smoking status: Never Smoker  . Smokeless tobacco: Never Used  Substance and Sexual Activity  . Alcohol use: Yes    Alcohol/week: 0.0 standard drinks    Comment: wine 3-4 times a week  . Drug use: No  . Sexual activity: Not on file  Other Topics Concern  . Not on file  Social History Narrative   Lives with wife   Right handed    Caffeine use: 1-2 cups coffee per day   Social Determinants of Health   Financial Resource Strain:   . Difficulty of Paying Living Expenses: Not on file  Food Insecurity:   . Worried About Charity fundraiser in the Last Year: Not on file  . Ran Out of Food in the Last Year: Not on file  Transportation Needs:   . Lack of Transportation (Medical): Not on file  . Lack of Transportation (Non-Medical): Not on file  Physical Activity:   . Days of Exercise per Week: Not on file  . Minutes of Exercise per Session: Not on file  Stress:   . Feeling of Stress : Not on file  Social Connections:   . Frequency of Communication with Friends and Family: Not on file  . Frequency of Social Gatherings with Friends and Family: Not on file  . Attends Religious Services: Not on file  . Active Member of Clubs or Organizations: Not on file  . Attends Archivist Meetings: Not on file  . Marital Status: Not on file  Intimate Partner Violence:   . Fear of Current or Ex-Partner: Not on file  .  Emotionally Abused: Not on file  . Physically Abused: Not on file  . Sexually Abused: Not on file   PHYSICAL EXAM  Vitals:   03/10/19 0911  BP: 117/77  Pulse: 84  Temp: (!) 97.3 F (36.3 C)  SpO2: 96%  Weight: 203 lb (92.1 kg)  Height: 5\' 7"  (1.702 m)   Body mass index is 31.79 kg/m.  Generalized: Well developed, in no acute distress  MMSE - Mini Mental State Exam 03/10/2019 07/16/2018 12/16/2017  Orientation to time 1 1 3   Orientation to Place 4 5 5   Registration 3 3 3   Attention/ Calculation 5 5 5   Recall 1 1 1   Language- name 2 objects 2 2 2   Language- repeat 1 0 1  Language- follow 3 step command 3 3 3   Language- read & follow direction 1 1 1   Write a sentence 1 1 1   Copy design 1 1 1   Total score 23 23 26     Neurological examination  Mentation: Alert oriented to time, place, history taking. Follows all commands speech and language fluent Cranial nerve II-XII: Pupils were equal round reactive to light. Extraocular movements were full, visual field were full on confrontational test. Facial sensation and strength were normal. Head turning and shoulder shrug were normal and symmetric. Motor: The motor testing reveals 5 over 5 strength of all 4 extremities. Good symmetric motor tone is noted throughout.  Sensory: Sensory testing is intact to soft touch on all 4 extremities. No evidence of extinction is noted.  Coordination: Cerebellar testing reveals good finger-nose-finger and heel-to-shin bilaterally.  Gait and station: Gait is normal.  Reflexes: Deep tendon reflexes are symmetric   DIAGNOSTIC DATA (LABS, IMAGING, TESTING) - I reviewed patient records, labs, notes, testing and imaging myself where available.  Lab Results  Component Value Date   WBC 8.6 09/20/2016   HGB 14.9 09/20/2016   HCT 43.4 09/20/2016   MCV 91 09/20/2016   PLT 156 09/20/2016      Component Value Date/Time   NA 144 11/12/2016 0941   K 3.8 11/12/2016 0941   CL 102 11/12/2016 0941   CO2 25  11/12/2016 0941   GLUCOSE 97 11/12/2016 0941   GLUCOSE 86 08/21/2013 1236   BUN 25 11/12/2016 0941   CREATININE 1.09 11/12/2016 0941   CREATININE 1.09 08/21/2013 1236   CALCIUM 9.6 11/12/2016 0941   PROT 7.6 08/21/2013 1236   ALBUMIN 4.3 08/21/2013 1236   AST 33 08/21/2013 1236   ALT 23 08/21/2013 1236   ALKPHOS 51 08/21/2013 1236   BILITOT 0.6 08/21/2013 1236   GFRNONAA 62 11/12/2016 0941   GFRNONAA 64 08/21/2013 1236   GFRAA 72 11/12/2016 0941   GFRAA 74 08/21/2013 1236   Lab Results  Component Value Date   CHOL 164 06/10/2012   HDL 64 06/10/2012   LDLCALC 81 06/10/2012   TRIG 94 06/10/2012   CHOLHDL 2.6 06/10/2012   No results found for: HGBA1C Lab Results  Component Value Date   [id number] 06/14/2017   Lab Results  Component Value Date   TSH 3.828 06/10/2012      ASSESSMENT AND PLAN 86 y.o. year old male  has a past medical history of Arthritis, Atrial fibrillation ([place]), Dysrhythmia, Hyperlipemia, Hypertension, Memory difficulty (12/16/2017), Pacemaker, Pacemaker, Sinus bradycardia seen on cardiac monitor, Sleep apnea, Wears glasses, and Wears hearing aid. here with:  1.  Progressive memory disturbance  He seems to be overall stable, memory score remained 23/30.  He will remain on Aricept and Namenda, I refilled medications for 1 year.  He will continue follow-up with his PCP, follow-up here on an as-needed basis.  His driving will be closely scrutinized by his wife.  I spent 15 minutes with the patient. 50% of this time was spent discussing his plan of care.  Butler Denmark, AGNP-C, DNP 03/10/2019, 9:25 AM [facility] [address] [place], [place] [address] [phone]

## 2019-03-12 DIAGNOSIS — M5136 Other intervertebral disc degeneration, lumbar region: Secondary | ICD-10-CM | POA: Diagnosis not present

## 2019-03-16 ENCOUNTER — Ambulatory Visit: Payer: Medicare Other | Admitting: Neurology

## 2019-03-25 DIAGNOSIS — E7849 Other hyperlipidemia: Secondary | ICD-10-CM | POA: Diagnosis not present

## 2019-03-31 ENCOUNTER — Ambulatory Visit (INDEPENDENT_AMBULATORY_CARE_PROVIDER_SITE_OTHER): Payer: Medicare Other | Admitting: *Deleted

## 2019-03-31 DIAGNOSIS — I442 Atrioventricular block, complete: Secondary | ICD-10-CM

## 2019-03-31 LAB — CUP PACEART REMOTE DEVICE CHECK
Battery Remaining Longevity: 110 mo
Battery Voltage: 3.01 V
Brady Statistic RV Percent Paced: 99.88 %
Date Time Interrogation Session: 20210323171858
Implantable Lead Implant Date: 20090402
Implantable Lead Location: 753860
Implantable Lead Model: 5076
Implantable Pulse Generator Implant Date: 20180919
Lead Channel Impedance Value: 342 Ohm
Lead Channel Impedance Value: 380 Ohm
Lead Channel Pacing Threshold Amplitude: 0.75 V
Lead Channel Pacing Threshold Pulse Width: 0.4 ms
Lead Channel Sensing Intrinsic Amplitude: 12.375 mV
Lead Channel Sensing Intrinsic Amplitude: 12.375 mV
Lead Channel Setting Pacing Amplitude: 2.5 V
Lead Channel Setting Pacing Pulse Width: 0.4 ms
Lead Channel Setting Sensing Sensitivity: 4 mV

## 2019-04-01 DIAGNOSIS — I129 Hypertensive chronic kidney disease with stage 1 through stage 4 chronic kidney disease, or unspecified chronic kidney disease: Secondary | ICD-10-CM | POA: Diagnosis not present

## 2019-04-01 DIAGNOSIS — N1831 Chronic kidney disease, stage 3a: Secondary | ICD-10-CM | POA: Diagnosis not present

## 2019-04-01 DIAGNOSIS — D6869 Other thrombophilia: Secondary | ICD-10-CM | POA: Diagnosis not present

## 2019-04-01 DIAGNOSIS — F0390 Unspecified dementia without behavioral disturbance: Secondary | ICD-10-CM | POA: Diagnosis not present

## 2019-04-01 DIAGNOSIS — E785 Hyperlipidemia, unspecified: Secondary | ICD-10-CM | POA: Diagnosis not present

## 2019-04-01 DIAGNOSIS — Z0000 Encounter for general adult medical examination without abnormal findings: Secondary | ICD-10-CM | POA: Diagnosis not present

## 2019-04-01 DIAGNOSIS — E669 Obesity, unspecified: Secondary | ICD-10-CM | POA: Diagnosis not present

## 2019-04-01 DIAGNOSIS — R82998 Other abnormal findings in urine: Secondary | ICD-10-CM | POA: Diagnosis not present

## 2019-04-01 DIAGNOSIS — I480 Paroxysmal atrial fibrillation: Secondary | ICD-10-CM | POA: Diagnosis not present

## 2019-04-01 DIAGNOSIS — I10 Essential (primary) hypertension: Secondary | ICD-10-CM | POA: Diagnosis not present

## 2019-04-01 NOTE — Progress Notes (Signed)
PPM Remote  

## 2019-04-02 DIAGNOSIS — Z23 Encounter for immunization: Secondary | ICD-10-CM | POA: Diagnosis not present

## 2019-04-16 DIAGNOSIS — M5136 Other intervertebral disc degeneration, lumbar region: Secondary | ICD-10-CM | POA: Diagnosis not present

## 2019-04-20 DIAGNOSIS — D485 Neoplasm of uncertain behavior of skin: Secondary | ICD-10-CM | POA: Diagnosis not present

## 2019-04-20 DIAGNOSIS — C44629 Squamous cell carcinoma of skin of left upper limb, including shoulder: Secondary | ICD-10-CM | POA: Diagnosis not present

## 2019-04-28 DIAGNOSIS — C44629 Squamous cell carcinoma of skin of left upper limb, including shoulder: Secondary | ICD-10-CM | POA: Diagnosis not present

## 2019-04-29 DIAGNOSIS — Z23 Encounter for immunization: Secondary | ICD-10-CM | POA: Diagnosis not present

## 2019-05-01 DIAGNOSIS — M25571 Pain in right ankle and joints of right foot: Secondary | ICD-10-CM | POA: Diagnosis not present

## 2019-05-05 DIAGNOSIS — Z961 Presence of intraocular lens: Secondary | ICD-10-CM | POA: Diagnosis not present

## 2019-05-06 DIAGNOSIS — M25571 Pain in right ankle and joints of right foot: Secondary | ICD-10-CM | POA: Diagnosis not present

## 2019-05-08 DIAGNOSIS — M79644 Pain in right finger(s): Secondary | ICD-10-CM | POA: Diagnosis not present

## 2019-05-12 ENCOUNTER — Other Ambulatory Visit: Payer: Self-pay | Admitting: Cardiovascular Disease

## 2019-05-13 ENCOUNTER — Other Ambulatory Visit: Payer: Self-pay | Admitting: Cardiovascular Disease

## 2019-05-22 DIAGNOSIS — M79644 Pain in right finger(s): Secondary | ICD-10-CM | POA: Diagnosis not present

## 2019-06-04 DIAGNOSIS — M109 Gout, unspecified: Secondary | ICD-10-CM | POA: Diagnosis not present

## 2019-06-11 DIAGNOSIS — D485 Neoplasm of uncertain behavior of skin: Secondary | ICD-10-CM | POA: Diagnosis not present

## 2019-06-11 DIAGNOSIS — C44629 Squamous cell carcinoma of skin of left upper limb, including shoulder: Secondary | ICD-10-CM | POA: Diagnosis not present

## 2019-06-23 DIAGNOSIS — C44629 Squamous cell carcinoma of skin of left upper limb, including shoulder: Secondary | ICD-10-CM | POA: Diagnosis not present

## 2019-06-30 ENCOUNTER — Ambulatory Visit (INDEPENDENT_AMBULATORY_CARE_PROVIDER_SITE_OTHER): Payer: Medicare Other | Admitting: *Deleted

## 2019-06-30 DIAGNOSIS — I442 Atrioventricular block, complete: Secondary | ICD-10-CM

## 2019-07-01 LAB — CUP PACEART REMOTE DEVICE CHECK
Battery Remaining Longevity: 111 mo
Battery Voltage: 3 V
Brady Statistic RV Percent Paced: 99.93 %
Date Time Interrogation Session: 20210622133047
Implantable Lead Implant Date: 20090402
Implantable Lead Location: 753860
Implantable Lead Model: 5076
Implantable Pulse Generator Implant Date: 20180919
Lead Channel Impedance Value: 380 Ohm
Lead Channel Impedance Value: 418 Ohm
Lead Channel Pacing Threshold Amplitude: 0.75 V
Lead Channel Pacing Threshold Pulse Width: 0.4 ms
Lead Channel Sensing Intrinsic Amplitude: 11.5 mV
Lead Channel Sensing Intrinsic Amplitude: 11.5 mV
Lead Channel Setting Pacing Amplitude: 2.5 V
Lead Channel Setting Pacing Pulse Width: 0.4 ms
Lead Channel Setting Sensing Sensitivity: 4 mV

## 2019-07-01 NOTE — Progress Notes (Signed)
Remote pacemaker transmission.   

## 2019-07-14 ENCOUNTER — Telehealth: Payer: Self-pay | Admitting: Internal Medicine

## 2019-07-14 DIAGNOSIS — G4733 Obstructive sleep apnea (adult) (pediatric): Secondary | ICD-10-CM

## 2019-07-14 NOTE — Telephone Encounter (Signed)
Ok to order replacement for old CPAP machine, changing to auto 5-15, mask of choice, humidifier, supplies, AirView/ card

## 2019-07-14 NOTE — Telephone Encounter (Signed)
Order placed for new CPAP machine, sent to [facility].  Call placed to patient.  Left VM letting patient know the order has been placed and advised to call with any questions.

## 2019-07-14 NOTE — Telephone Encounter (Signed)
Dr. Annamaria Boots, patient has called and requesting a new cpap.  Please advise if we can send in an order for new cpap. Thank you.

## 2019-07-23 DIAGNOSIS — F0390 Unspecified dementia without behavioral disturbance: Secondary | ICD-10-CM | POA: Diagnosis not present

## 2019-07-23 DIAGNOSIS — R05 Cough: Secondary | ICD-10-CM | POA: Diagnosis not present

## 2019-07-23 DIAGNOSIS — M109 Gout, unspecified: Secondary | ICD-10-CM | POA: Diagnosis not present

## 2019-07-23 DIAGNOSIS — N1831 Chronic kidney disease, stage 3a: Secondary | ICD-10-CM | POA: Diagnosis not present

## 2019-07-23 DIAGNOSIS — I129 Hypertensive chronic kidney disease with stage 1 through stage 4 chronic kidney disease, or unspecified chronic kidney disease: Secondary | ICD-10-CM | POA: Diagnosis not present

## 2019-07-23 DIAGNOSIS — M2550 Pain in unspecified joint: Secondary | ICD-10-CM | POA: Diagnosis not present

## 2019-07-23 DIAGNOSIS — I480 Paroxysmal atrial fibrillation: Secondary | ICD-10-CM | POA: Diagnosis not present

## 2019-07-24 DIAGNOSIS — M109 Gout, unspecified: Secondary | ICD-10-CM | POA: Diagnosis not present

## 2019-07-24 DIAGNOSIS — M791 Myalgia, unspecified site: Secondary | ICD-10-CM | POA: Diagnosis not present

## 2019-08-19 ENCOUNTER — Other Ambulatory Visit: Payer: Self-pay | Admitting: Cardiovascular Disease

## 2019-10-19 DIAGNOSIS — M2550 Pain in unspecified joint: Secondary | ICD-10-CM | POA: Diagnosis not present

## 2019-10-21 ENCOUNTER — Ambulatory Visit (INDEPENDENT_AMBULATORY_CARE_PROVIDER_SITE_OTHER): Payer: Medicare Other

## 2019-10-21 DIAGNOSIS — I442 Atrioventricular block, complete: Secondary | ICD-10-CM | POA: Diagnosis not present

## 2019-10-23 LAB — CUP PACEART REMOTE DEVICE CHECK
Battery Remaining Longevity: 105 mo
Battery Voltage: 3 V
Brady Statistic RV Percent Paced: 99.94 %
Date Time Interrogation Session: 20211012123604
Implantable Lead Implant Date: 20090402
Implantable Lead Location: 753860
Implantable Lead Model: 5076
Implantable Pulse Generator Implant Date: 20180919
Lead Channel Impedance Value: 361 Ohm
Lead Channel Impedance Value: 399 Ohm
Lead Channel Pacing Threshold Amplitude: 0.75 V
Lead Channel Pacing Threshold Pulse Width: 0.4 ms
Lead Channel Sensing Intrinsic Amplitude: 11.5 mV
Lead Channel Sensing Intrinsic Amplitude: 11.5 mV
Lead Channel Setting Pacing Amplitude: 2.5 V
Lead Channel Setting Pacing Pulse Width: 0.4 ms
Lead Channel Setting Sensing Sensitivity: 4 mV

## 2019-10-26 NOTE — Progress Notes (Signed)
Remote pacemaker transmission.   

## 2019-11-03 ENCOUNTER — Other Ambulatory Visit: Payer: Self-pay | Admitting: Cardiovascular Disease

## 2019-11-04 DIAGNOSIS — Z23 Encounter for immunization: Secondary | ICD-10-CM | POA: Diagnosis not present

## 2019-11-05 DIAGNOSIS — M1A09X0 Idiopathic chronic gout, multiple sites, without tophus (tophi): Secondary | ICD-10-CM | POA: Diagnosis not present

## 2019-11-05 DIAGNOSIS — N1832 Chronic kidney disease, stage 3b: Secondary | ICD-10-CM | POA: Diagnosis not present

## 2019-11-05 DIAGNOSIS — M2550 Pain in unspecified joint: Secondary | ICD-10-CM | POA: Diagnosis not present

## 2019-11-05 DIAGNOSIS — Z6829 Body mass index (BMI) 29.0-29.9, adult: Secondary | ICD-10-CM | POA: Diagnosis not present

## 2019-11-05 DIAGNOSIS — E663 Overweight: Secondary | ICD-10-CM | POA: Diagnosis not present

## 2020-01-20 ENCOUNTER — Ambulatory Visit (INDEPENDENT_AMBULATORY_CARE_PROVIDER_SITE_OTHER): Payer: Medicare Other

## 2020-01-20 DIAGNOSIS — I442 Atrioventricular block, complete: Secondary | ICD-10-CM

## 2020-01-22 LAB — CUP PACEART REMOTE DEVICE CHECK
Battery Remaining Longevity: 100 mo
Battery Voltage: 3 V
Brady Statistic RV Percent Paced: 99.95 %
Date Time Interrogation Session: 20220113185807
Implantable Lead Implant Date: 20090402
Implantable Lead Location: 753860
Implantable Lead Model: 5076
Implantable Pulse Generator Implant Date: 20180919
Lead Channel Impedance Value: 342 Ohm
Lead Channel Impedance Value: 380 Ohm
Lead Channel Pacing Threshold Amplitude: 0.875 V
Lead Channel Pacing Threshold Pulse Width: 0.4 ms
Lead Channel Sensing Intrinsic Amplitude: 11.5 mV
Lead Channel Sensing Intrinsic Amplitude: 11.5 mV
Lead Channel Setting Pacing Amplitude: 2.5 V
Lead Channel Setting Pacing Pulse Width: 0.4 ms
Lead Channel Setting Sensing Sensitivity: 4 mV

## 2020-01-28 ENCOUNTER — Other Ambulatory Visit: Payer: Self-pay | Admitting: Cardiology

## 2020-01-28 ENCOUNTER — Other Ambulatory Visit: Payer: Self-pay | Admitting: Cardiovascular Disease

## 2020-01-28 ENCOUNTER — Other Ambulatory Visit: Payer: Self-pay | Admitting: Neurology

## 2020-02-02 NOTE — Progress Notes (Signed)
Remote pacemaker transmission.   

## 2020-02-10 DIAGNOSIS — E663 Overweight: Secondary | ICD-10-CM | POA: Diagnosis not present

## 2020-02-10 DIAGNOSIS — M1A09X0 Idiopathic chronic gout, multiple sites, without tophus (tophi): Secondary | ICD-10-CM | POA: Diagnosis not present

## 2020-02-10 DIAGNOSIS — M2550 Pain in unspecified joint: Secondary | ICD-10-CM | POA: Diagnosis not present

## 2020-02-10 DIAGNOSIS — N1832 Chronic kidney disease, stage 3b: Secondary | ICD-10-CM | POA: Diagnosis not present

## 2020-02-10 DIAGNOSIS — Z6829 Body mass index (BMI) 29.0-29.9, adult: Secondary | ICD-10-CM | POA: Diagnosis not present

## 2020-02-10 NOTE — Progress Notes (Signed)
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 [place] 2 machines[place] [facility] Says he uses every night and wife tells him he is "doing fine" with CPAP   Observations/Objective:   Assessment and Plan: OSA- benefits. They [place] ask DME how old machine is. Continue 9 cwp. 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 Flu vax- had -----DL given to CY.  Seems to be fighting the machine more and the machine is more loud.  This is disrupting his sleep pattern/cycle. We discussed replacement of old machine. He and wife also had questions about Inspire surgery which we discussed as alternative to CPAP.   ROS-see HPI              + = positive Constitutional:   No-   weight loss, night sweats, fevers, chills, fatigue, lassitude. HEENT:   No-  headaches, difficulty swallowing, tooth/dental problems, sore throat,       No-  sneezing, itching, ear ache, +nasal congestion, post nasal drip,  CV:  No-   chest pain,  orthopnea, PND, swelling in lower extremities, anasarca,                                                      dizziness, palpitations Resp: No-   shortness of breath with exertion or at rest.              productive cough,  No non-productive cough,  No- coughing up of blood.              No-   change in color of mucus.  No- wheezing.   Skin: No-   rash or lesions. GI:  No-   heartburn, indigestion, abdominal pain, nausea, vomiting, diarrhea,                 change in bowel habits, loss of appetite GU: No-   dysuria, change in color of urine, no urgency or frequency.  No- flank pain. MS:  No-   joint pain or swelling.  No- decreased range of motion.  No- back pain. Neuro-     nothing unusual Psych:  No- change in mood or affect. No depression or anxiety.  No memory loss.  OBJ- Physical Exam    Medium build, not obese General- Alert, Oriented, Affect-appropriate, Distress- none acute Skin- rash-none, lesions- none, excoriation- none.  Lymphadenopathy- none Head- atraumatic            Eyes- Gross vision intact, PERRLA, conjunctivae and secretions clear  Ears- Hearing, canals-normal            Nose- Clear, no-Septal dev, mucus, polyps, erosion, + perforation             Throat- Mallampati II-III , mucosa clear , drainage- none, tonsils- atrophic Neck- flexible , trachea midline, no stridor , thyroid nl, carotid no bruit Chest - symmetrical excursion , unlabored           Heart/CV- RRR-feels regular , no murmur , no gallop  , no rub, nl s1 s2                           - JVD- none , edema- none, stasis changes- none, varices- none           Lung- clear to P&A, wheeze- none, cough- none , dullness-none, rub- none           Chest wall- +pacemaker R Abd- tender-no, distended-no, bowel sounds-present, HSM- no Br/ Gen/ Rectal- Not done, not indicated Extrem- cyanosis- none, clubbing, none, atrophy- none, strength- nl Neuro- grossly intact to observation

## 2020-02-11 ENCOUNTER — Other Ambulatory Visit: Payer: Self-pay

## 2020-02-11 ENCOUNTER — Ambulatory Visit (INDEPENDENT_AMBULATORY_CARE_PROVIDER_SITE_OTHER): Payer: Medicare Other | Admitting: Internal Medicine

## 2020-02-11 ENCOUNTER — Encounter: Payer: Self-pay | Admitting: Internal Medicine

## 2020-02-11 VITALS — BP 112/78 | HR 85 | Temp 97.2°F | Ht 70.0 in | Wt 188.2 lb

## 2020-02-11 DIAGNOSIS — G4733 Obstructive sleep apnea (adult) (pediatric): Secondary | ICD-10-CM | POA: Diagnosis not present

## 2020-02-11 DIAGNOSIS — I442 Atrioventricular block, complete: Secondary | ICD-10-CM

## 2020-02-11 NOTE — Patient Instructions (Signed)
Order- DME [employer]- please replace old CPAP machine, change to auto 5-15, mask of choice, humidifier, supplies, AirView/ card  Order- referral to ENT- Dr Wilburn Cornelia or Dr Redmond Baseman  Consider Inspire as alternative to CPAP  Please call if we can help

## 2020-02-15 NOTE — Assessment & Plan Note (Signed)
Pacemaker in place.  Followed by cardiology.  

## 2020-02-15 NOTE — Assessment & Plan Note (Signed)
Benefits from CPAP but bothered by noisy machine and interested in options.  Machine should be eligible for replacement. He might be a candidate for Inspire. Plan- replace old CPAP machine, changing to auto 5-15. Also referral to ENT to consider Inspire.

## 2020-02-22 ENCOUNTER — Encounter: Payer: Self-pay | Admitting: Cardiovascular Disease

## 2020-02-22 ENCOUNTER — Other Ambulatory Visit: Payer: Self-pay

## 2020-02-22 ENCOUNTER — Ambulatory Visit (INDEPENDENT_AMBULATORY_CARE_PROVIDER_SITE_OTHER): Payer: Medicare Other | Admitting: Cardiovascular Disease

## 2020-02-22 VITALS — BP 87/56 | HR 73 | Ht 69.0 in | Wt 187.4 lb

## 2020-02-22 DIAGNOSIS — I10 Essential (primary) hypertension: Secondary | ICD-10-CM | POA: Diagnosis not present

## 2020-02-22 DIAGNOSIS — I442 Atrioventricular block, complete: Secondary | ICD-10-CM

## 2020-02-22 DIAGNOSIS — Z7901 Long term (current) use of anticoagulants: Secondary | ICD-10-CM

## 2020-02-22 DIAGNOSIS — D485 Neoplasm of uncertain behavior of skin: Secondary | ICD-10-CM | POA: Diagnosis not present

## 2020-02-22 DIAGNOSIS — E7800 Pure hypercholesterolemia, unspecified: Secondary | ICD-10-CM

## 2020-02-22 DIAGNOSIS — I4821 Permanent atrial fibrillation: Secondary | ICD-10-CM

## 2020-02-22 DIAGNOSIS — Z950 Presence of cardiac pacemaker: Secondary | ICD-10-CM | POA: Diagnosis not present

## 2020-02-22 DIAGNOSIS — C44622 Squamous cell carcinoma of skin of right upper limb, including shoulder: Secondary | ICD-10-CM | POA: Diagnosis not present

## 2020-02-22 DIAGNOSIS — D225 Melanocytic nevi of trunk: Secondary | ICD-10-CM | POA: Diagnosis not present

## 2020-02-22 DIAGNOSIS — G4733 Obstructive sleep apnea (adult) (pediatric): Secondary | ICD-10-CM | POA: Diagnosis not present

## 2020-02-22 DIAGNOSIS — L578 Other skin changes due to chronic exposure to nonionizing radiation: Secondary | ICD-10-CM | POA: Diagnosis not present

## 2020-02-22 DIAGNOSIS — L814 Other melanin hyperpigmentation: Secondary | ICD-10-CM | POA: Diagnosis not present

## 2020-02-22 DIAGNOSIS — L570 Actinic keratosis: Secondary | ICD-10-CM | POA: Diagnosis not present

## 2020-02-22 DIAGNOSIS — L821 Other seborrheic keratosis: Secondary | ICD-10-CM | POA: Diagnosis not present

## 2020-02-22 DIAGNOSIS — Z85828 Personal history of other malignant neoplasm of skin: Secondary | ICD-10-CM | POA: Diagnosis not present

## 2020-02-22 MED ORDER — CHLORTHALIDONE 25 MG PO TABS: 12.5000 mg | ORAL_TABLET | Freq: Every day | ORAL | 3 refills | Status: DC

## 2020-02-22 NOTE — Progress Notes (Unsigned)
Cardiology Office Note    Date:  02/23/2020   ID:  [name], Dean Morgan, DOB 1931/05/08, MRN [id number]  PCP:  Marton Redwood, MD  Cardiologist:   Sanda Klein, MD   Chief Complaint  Patient presents with  . Atrial Fibrillation  . Pacemaker Check    History of Present Illness:  Dean Morgan, Dean Morgan is a 87 y.o. male with complete heart block and permanent atrial fibrillation here for pacemaker follow-up. He also has obstructive sleep apnea, hypertension and hyperlipidemia, but does not have known coronary disease or other structural heart problems.  He feels well. His BP is consistently low, but he denies dizziness, fatigue or syncope. Continues to live with his wife independently. Stable memory issues.  Pacemaker interrogation shows normal device function. He is pacemaker dependent without any underlying escape rhythm. His Medtronic Azure single-chamber pacemaker generator was implanted in September 2018, the leads were implanted in 2009. Estimated generator longevity  8.4 years.  He is pacemaker dependent - there are no detectable R waves. 100% V-paced.  Activity level is steady at about 1.1 h/day.  Past Medical History:  Diagnosis Date  . Arthritis   . Atrial fibrillation ([place])   . Dysrhythmia    AF  . Hyperlipemia   . Hypertension   . Memory difficulty 12/16/2017  . Pacemaker   . Pacemaker   . Sinus bradycardia seen on cardiac monitor   . Sleep apnea    does use his cpap  . Wears glasses   . Wears hearing aid     Past Surgical History:  Procedure Laterality Date  . CARDIOVERSION     multiple times-pacer put in 2009  . COLONOSCOPY    . I & D EXTREMITY Left 04/20/2013   Procedure: LEFT HAND WOUND DEBRIDEMENT AND CLOSURE ;  Surgeon: Jolyn Nap, MD;  Location: [facility];  Service: Orthopedics;  Laterality: Left;  . KNEE ARTHROSCOPY  2/10   left  . MASS EXCISION  01/29/2012   Procedure: EXCISION MASS;  Surgeon: Hessie Dibble, MD;  Location:  [facility];  Service: Orthopedics;  Laterality: Left;  excision of bone spur left hand  . METACARPAL OSTEOTOMY Left 01/13/2013   Procedure: LEFT INDEX FINGER MCP RADIAL COLLATERAL REPAIR/RECONSTRUCTION VS METACARPAL ROTATIONAL OSTEOTOMY;  Surgeon: Jolyn Nap, MD;  Location: [facility];  Service: Orthopedics;  Laterality: Left;  . PACEMAKER INSERTION  2009  . PPM GENERATOR CHANGEOUT N/A 09/26/2016   Procedure: PPM Generator Changeout;  Surgeon: Sanda Klein, MD;  Location: [facility];  Service: Cardiovascular;  Laterality: N/A;  . SHOULDER ARTHROSCOPY  2004   left  . THUMB ARTHROSCOPY  2007   rt thunb mass  . TONSILLECTOMY      Current Medications: Outpatient Medications Prior to Visit  Medication Sig Dispense Refill  . acetaminophen (TYLENOL) 325 MG tablet Take 650 mg by mouth every 6 (six) hours as needed. OTC for arthritis.    Marland Kitchen allopurinol (ZYLOPRIM) 300 MG tablet Take by mouth.    Marland Kitchen amLODipine (NORVASC) 5 MG tablet TAKE 1 TABLET BY MOUTH EVERY DAY 90 tablet 3  . apixaban (ELIQUIS) 5 MG TABS tablet Take 5 mg by mouth 2 (two) times daily.    . Arginine 1000 MG TABS Take 1,000 mg by mouth daily.     Marland Kitchen aspirin EC 81 MG tablet Take 81 mg by mouth at bedtime.    . Cholecalciferol (VITAMIN D3) 5000 UNITS TABS Take 5,000  Units by mouth daily.     . colchicine 0.6 MG tablet     . donepezil (ARICEPT) 10 MG tablet Take 1 tablet (10 mg total) by mouth every morning. 30 tablet 11  . ezetimibe (ZETIA) 10 MG tablet Take 1 tablet (10 mg total) by mouth daily. (Patient taking differently: Take 10 mg by mouth daily with breakfast.) 28 tablet 0  . fish oil-omega-3 fatty acids 1000 MG capsule Take 1 g by mouth 2 (two) times daily.     . Glucosamine-Chondroitin (COSAMIN DS PO) Take by mouth daily with breakfast.    . memantine (NAMENDA) 10 MG tablet Take 1 tablet (10 mg total) by mouth 2 (two) times daily. 60 tablet 11  . Nebivolol HCl 20 MG TABS TAKE 2 TABLET  BY MOUTH EVERY DAY 180 tablet 3  . niacin (SLO-NIACIN) 500 MG tablet Take 500 mg by mouth 2 (two) times daily with a meal.    . olmesartan (BENICAR) 40 MG tablet Take 1 tablet by mouth daily.    . potassium chloride SA (KLOR-CON) 20 MEQ tablet TAKE 1 TABLET BY MOUTH daily 90 tablet 3  . chlorthalidone (HYGROTON) 25 MG tablet TAKE 1 TABLET BY MOUTH EVERY DAY 90 tablet 3   No facility-administered medications prior to visit.     Allergies:   Lipitor [atorvastatin], Penicillins, and Vytorin [ezetimibe-simvastatin]   Social History   Socioeconomic History  . Marital status: Married    Spouse name: Ashvin Adelson  . Number of children: 2  . Years of education: Dean Morgan  . Highest education level: Not on file  Occupational History  . Occupation: Dean Morgan-Retired  Tobacco Use  . Smoking status: Never Smoker  . Smokeless tobacco: Never Used  Vaping Use  . Vaping Use: Never used  Substance and Sexual Activity  . Alcohol use: Yes    Alcohol/week: 0.0 standard drinks    Comment: wine 3-4 times a week  . Drug use: No  . Sexual activity: Not on file  Other Topics Concern  . Not on file  Social History Narrative   Lives with wife   Right handed    Caffeine use: 1-2 cups coffee per day   Social Determinants of Health   Financial Resource Strain: Not on file  Food Insecurity: Not on file  Transportation Needs: Not on file  Physical Activity: Not on file  Stress: Not on file  Social Connections: Not on file     Family History:  The patient's family history includes Dementia in his mother; Diabetes in his brother and maternal grandmother; Emphysema in an other family member; Heart disease in an other family member; Hypertension in his brother; Stroke in his father and paternal grandfather.   ROS:   Please see the history of present illness.   All other systems are reviewed and are negative.   PHYSICAL EXAM:   VS:  BP (!) 87/56   Pulse 73   Ht 5\' 9"  (1.753 m)   Wt 187 lb 6.4 oz (85 kg)    SpO2 98%   BMI 27.67 kg/m    BP 91/57 mmHg, equal in both arms  General: Alert, oriented x3, no distress, healthy subclavian pacemaker site Head: no evidence of trauma, PERRL, EOMI, no exophtalmos or lid lag, no myxedema, no xanthelasma; normal ears, nose and oropharynx Neck: normal jugular venous pulsations and no hepatojugular reflux; brisk carotid pulses without delay and no carotid bruits Chest: clear to auscultation, no signs of consolidation by percussion or palpation, normal  fremitus, symmetrical and full respiratory excursions Cardiovascular: normal position and quality of the apical impulse, regular rhythm, normal first and paradoxically split second heart sounds, no murmurs, rubs or gallops Abdomen: no tenderness or distention, no masses by palpation, no abnormal pulsatility or arterial bruits, normal bowel sounds, no hepatosplenomegaly Extremities: no clubbing, cyanosis or edema; 2+ radial, ulnar and brachial pulses bilaterally; 2+ right femoral, posterior tibial and dorsalis pedis pulses; 2+ left femoral, posterior tibial and dorsalis pedis pulses; no subclavian or femoral bruits Neurological: grossly nonfocal Psych: Normal mood and affect   Wt Readings from Last 3 Encounters:  02/22/20 187 lb 6.4 oz (85 kg)  02/11/20 188 lb 4 oz (85.4 kg)  03/10/19 203 lb (92.1 kg)     Studies/Labs Reviewed:   EKG:  EKG is not ordered today. It shows AFib, 100% V paced.  Lipid Panel    Component Value Date/Time   CHOL 164 06/10/2012 0815   TRIG 94 06/10/2012 0815   HDL 64 06/10/2012 0815   CHOLHDL 2.6 06/10/2012 0815   VLDL 19 06/10/2012 0815   LDLCALC 81 06/10/2012 0815   03/18/2018 Chol 207, HDL 52, LDL 125, TG 150 Creat 1.5, Hgb 13.8  03/25/2019 Total cholesterol 206, HDL 53, LDL 123, triglycerides 152 TSH 3.65, potassium 3.9  10/19/2019 Hemoglobin 12.5, creatinine 1.38, normal liver function tests  ASSESSMENT:    1. Complete heart block ([place])   2. Atrial  fibrillation, permanent ([place])   3. Long term current use of anticoagulant   4. Pacemaker   5. OSA (obstructive sleep apnea)   6. Pure hypercholesterolemia   7. Essential hypertension      PLAN:  In order of problems listed above:  1. AFib: Permanent arrhythmia.Marland Kitchen CHADSVasc 3-5 (age 70, HTN, questionable TIA in 2002).  2. Anticoagulation: No bleeding complications. 3. CHB: Pacemaker dependent.  Appropriate heart rate histogram distribution relatively sedentary lifestyle. 4. PPM: Continue remote downloads every 3 months. 5. OSA: Good compliance to CPAP.  Denies daytime hypersomnolence. 6. HLP: No known coronary or peripheral vascular disease.  Intolerant to statins.  On Zetia monotherapy. 7. HTN: Blood pressure is quite low, thankfully without any symptoms.  We will reduce the dose of his diuretic in half.    Medication Adjustments/Labs and Tests Ordered: Current medicines are reviewed at length with the patient today.  Concerns regarding medicines are outlined above.  Medication changes, Labs and Tests ordered today are listed in the Patient Instructions below. Patient Instructions  Medication Instructions:  DECREASE the Chlorthalidone to 12.5 mg (half a tablet) daily  *If you need a refill on your cardiac medications before your next appointment, please call your pharmacy*   Lab Work: None ordered If you have labs (blood work) drawn today and your tests are completely normal, you will receive your results only by: Marland Kitchen MyChart Message (if you have MyChart) OR . A paper copy in the mail If you have any lab test that is abnormal or we need to change your treatment, we will call you to review the results.   Testing/Procedures: None ordered   Follow-Up: At [facility], you and your health needs are our priority.  As part of our continuing mission to provide you with exceptional heart care, we have created designated Provider Care Teams.  These Care Teams include your primary  Cardiologist (physician) and Advanced Practice Providers (APPs -  Physician Assistants and Nurse Practitioners) who all work together to provide you with the care you need, when you need it.  We recommend signing up for the patient portal called "MyChart".  Sign up information is provided on this After Visit Summary.  MyChart is used to connect with patients for Virtual Visits (Telemedicine).  Patients are able to view lab/test results, encounter notes, upcoming appointments, etc.  Non-urgent messages can be sent to your provider as well.   To learn more about what you can do with MyChart, go to [email or web address].    Your next appointment:   12 month(s)  The format for your next appointment:   In Person  Provider:   Sanda Klein, MD   Other Instructions Dr. Sallyanne Kuster would like you to check your blood pressure daily for the next several weeks.  Keep a journal of these daily blood pressure and heart rate readings and call our office or send a message through [facility] with the results. Thank you!  It is best to check your BP 1-2 hours after taking your medications to see the medications effectiveness on your BP.    Here are some tips that our clinical pharmacists share for home BP monitoring:          Rest 10 minutes before taking your blood pressure.          Don't smoke or drink caffeinated beverages for at least 30 minutes before.          Take your blood pressure before (not after) you eat.          Sit comfortably with your back supported and both feet on the floor (don't cross your legs).          Elevate your arm to heart level on a table or a desk.          Use the proper sized cuff. It should fit smoothly and snugly around your bare upper arm. There should be enough room to slip a fingertip under the cuff. The bottom edge of the cuff should be 1 inch above the crease of the elbow.      Signed, Sanda Klein, MD  02/23/2020 5:29 PM    [facility]  [facility] [place], [address], [place]  [address] Phone: [phone]; Fax: [phone]

## 2020-02-22 NOTE — Patient Instructions (Signed)
Medication Instructions:  DECREASE the Chlorthalidone to 12.5 mg (half a tablet) daily  *If you need a refill on your cardiac medications before your next appointment, please call your pharmacy*   Lab Work: None ordered If you have labs (blood work) drawn today and your tests are completely normal, you will receive your results only by: Marland Kitchen MyChart Message (if you have MyChart) OR . A paper copy in the mail If you have any lab test that is abnormal or we need to change your treatment, we will call you to review the results.   Testing/Procedures: None ordered   Follow-Up: At [facility], you and your health needs are our priority.  As part of our continuing mission to provide you with exceptional heart care, we have created designated Provider Care Teams.  These Care Teams include your primary Cardiologist (physician) and Advanced Practice Providers (APPs -  Physician Assistants and Nurse Practitioners) who all work together to provide you with the care you need, when you need it.  We recommend signing up for the patient portal called "MyChart".  Sign up information is provided on this After Visit Summary.  MyChart is used to connect with patients for Virtual Visits (Telemedicine).  Patients are able to view lab/test results, encounter notes, upcoming appointments, etc.  Non-urgent messages can be sent to your provider as well.   To learn more about what you can do with MyChart, go to [email or web address].    Your next appointment:   12 month(s)  The format for your next appointment:   In Person  Provider:   Sanda Klein, MD   Other Instructions Dr. Sallyanne Kuster would like you to check your blood pressure daily for the next several weeks.  Keep a journal of these daily blood pressure and heart rate readings and call our office or send a message through [facility] with the results. Thank you!  It is best to check your BP 1-2 hours after taking your medications to see the medications  effectiveness on your BP.    Here are some tips that our clinical pharmacists share for home BP monitoring:          Rest 10 minutes before taking your blood pressure.          Don't smoke or drink caffeinated beverages for at least 30 minutes before.          Take your blood pressure before (not after) you eat.          Sit comfortably with your back supported and both feet on the floor (don't cross your legs).          Elevate your arm to heart level on a table or a desk.          Use the proper sized cuff. It should fit smoothly and snugly around your bare upper arm. There should be enough room to slip a fingertip under the cuff. The bottom edge of the cuff should be 1 inch above the crease of the elbow.

## 2020-02-23 ENCOUNTER — Encounter: Payer: Self-pay | Admitting: Cardiovascular Disease

## 2020-03-16 DIAGNOSIS — C44622 Squamous cell carcinoma of skin of right upper limb, including shoulder: Secondary | ICD-10-CM | POA: Diagnosis not present

## 2020-03-24 ENCOUNTER — Other Ambulatory Visit: Payer: Self-pay | Admitting: Neurology

## 2020-04-05 DIAGNOSIS — Z125 Encounter for screening for malignant neoplasm of prostate: Secondary | ICD-10-CM | POA: Diagnosis not present

## 2020-04-05 DIAGNOSIS — E785 Hyperlipidemia, unspecified: Secondary | ICD-10-CM | POA: Diagnosis not present

## 2020-04-05 DIAGNOSIS — E291 Testicular hypofunction: Secondary | ICD-10-CM | POA: Diagnosis not present

## 2020-04-05 DIAGNOSIS — M109 Gout, unspecified: Secondary | ICD-10-CM | POA: Diagnosis not present

## 2020-04-08 DIAGNOSIS — J3489 Other specified disorders of nose and nasal sinuses: Secondary | ICD-10-CM | POA: Diagnosis not present

## 2020-04-08 DIAGNOSIS — G4733 Obstructive sleep apnea (adult) (pediatric): Secondary | ICD-10-CM | POA: Diagnosis not present

## 2020-04-12 DIAGNOSIS — Z7901 Long term (current) use of anticoagulants: Secondary | ICD-10-CM | POA: Diagnosis not present

## 2020-04-12 DIAGNOSIS — D6869 Other thrombophilia: Secondary | ICD-10-CM | POA: Diagnosis not present

## 2020-04-12 DIAGNOSIS — E669 Obesity, unspecified: Secondary | ICD-10-CM | POA: Diagnosis not present

## 2020-04-12 DIAGNOSIS — E785 Hyperlipidemia, unspecified: Secondary | ICD-10-CM | POA: Diagnosis not present

## 2020-04-12 DIAGNOSIS — F0390 Unspecified dementia without behavioral disturbance: Secondary | ICD-10-CM | POA: Diagnosis not present

## 2020-04-12 DIAGNOSIS — I480 Paroxysmal atrial fibrillation: Secondary | ICD-10-CM | POA: Diagnosis not present

## 2020-04-12 DIAGNOSIS — M109 Gout, unspecified: Secondary | ICD-10-CM | POA: Diagnosis not present

## 2020-04-12 DIAGNOSIS — Z1331 Encounter for screening for depression: Secondary | ICD-10-CM | POA: Diagnosis not present

## 2020-04-12 DIAGNOSIS — R82998 Other abnormal findings in urine: Secondary | ICD-10-CM | POA: Diagnosis not present

## 2020-04-12 DIAGNOSIS — N1831 Chronic kidney disease, stage 3a: Secondary | ICD-10-CM | POA: Diagnosis not present

## 2020-04-12 DIAGNOSIS — I10 Essential (primary) hypertension: Secondary | ICD-10-CM | POA: Diagnosis not present

## 2020-04-12 DIAGNOSIS — Z1339 Encounter for screening examination for other mental health and behavioral disorders: Secondary | ICD-10-CM | POA: Diagnosis not present

## 2020-04-12 DIAGNOSIS — Z0000 Encounter for general adult medical examination without abnormal findings: Secondary | ICD-10-CM | POA: Diagnosis not present

## 2020-04-12 DIAGNOSIS — I129 Hypertensive chronic kidney disease with stage 1 through stage 4 chronic kidney disease, or unspecified chronic kidney disease: Secondary | ICD-10-CM | POA: Diagnosis not present

## 2020-04-20 ENCOUNTER — Ambulatory Visit (INDEPENDENT_AMBULATORY_CARE_PROVIDER_SITE_OTHER): Payer: Medicare Other

## 2020-04-20 DIAGNOSIS — I442 Atrioventricular block, complete: Secondary | ICD-10-CM

## 2020-04-26 LAB — CUP PACEART REMOTE DEVICE CHECK
Battery Remaining Longevity: 96 mo
Battery Voltage: 2.99 V
Brady Statistic RV Percent Paced: 99.94 %
Date Time Interrogation Session: 20220419000559
Implantable Lead Implant Date: 20090402
Implantable Lead Location: 753860
Implantable Lead Model: 5076
Implantable Pulse Generator Implant Date: 20180919
Lead Channel Impedance Value: 304 Ohm
Lead Channel Impedance Value: 361 Ohm
Lead Channel Pacing Threshold Amplitude: 0.875 V
Lead Channel Pacing Threshold Pulse Width: 0.4 ms
Lead Channel Sensing Intrinsic Amplitude: 11.5 mV
Lead Channel Sensing Intrinsic Amplitude: 11.5 mV
Lead Channel Setting Pacing Amplitude: 2.5 V
Lead Channel Setting Pacing Pulse Width: 0.4 ms
Lead Channel Setting Sensing Sensitivity: 4 mV

## 2020-04-29 ENCOUNTER — Other Ambulatory Visit: Payer: Self-pay | Admitting: Neurology

## 2020-05-04 DIAGNOSIS — Z961 Presence of intraocular lens: Secondary | ICD-10-CM | POA: Diagnosis not present

## 2020-05-05 NOTE — Progress Notes (Signed)
Remote pacemaker transmission.   

## 2020-05-12 IMAGING — MR MR LUMBAR SPINE W/O CM
4 of 5 series · 19 of 48 positions shown · non-contrast
Comparison: None.

CLINICAL DATA: 87-year-old male with low back pain.

EXAM:
MRI LUMBAR SPINE WITHOUT CONTRAST
TECHNIQUE: Multiplanar, multisequence MR imaging of the lumbar spine was
performed. No intravenous contrast was administered.

[Series 3: T2 · sagittal · 4.0mm · 0.55mm/px · 6 of 13 slices shown (1 of 2)]
[im 1/13]
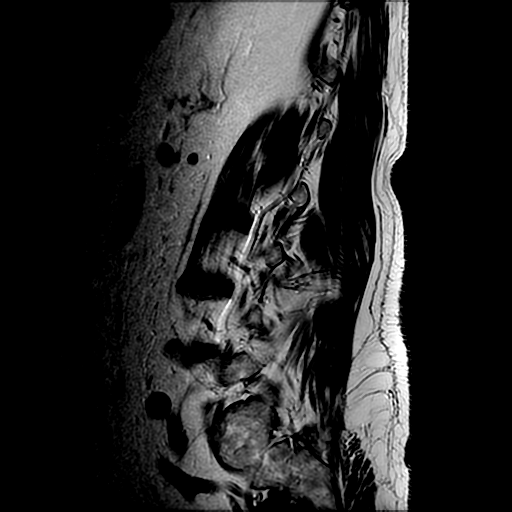
[im 3/13]
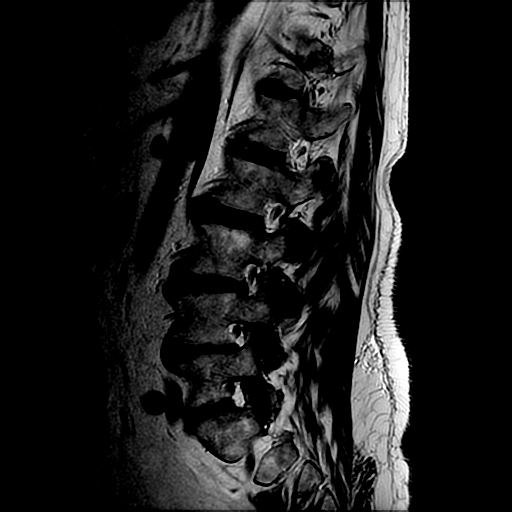
[im 5/13]
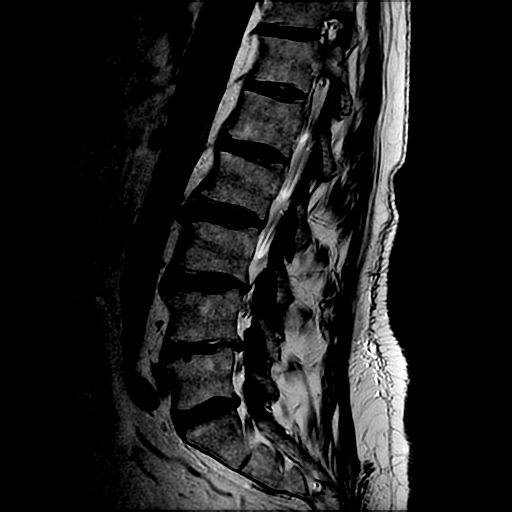
[im 8/13]
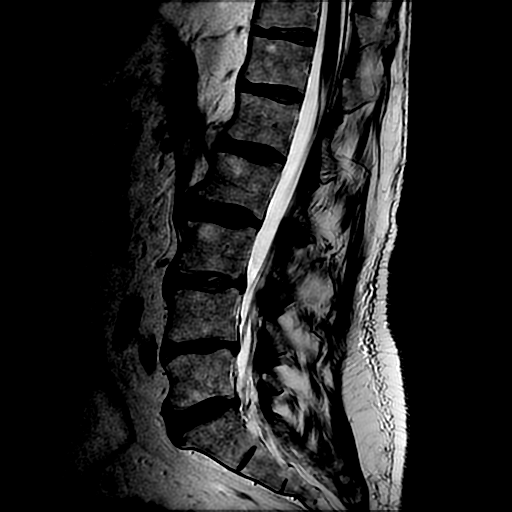
[im 10/13]
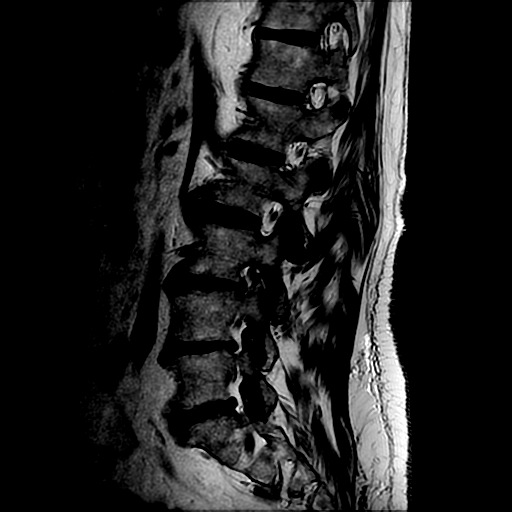
[im 13/13]
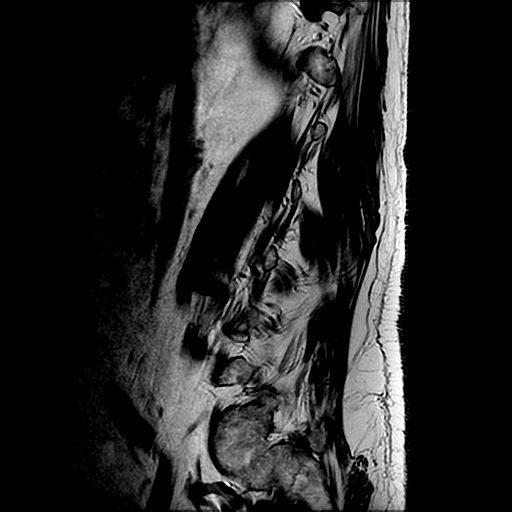

[Series 4: T1 · sagittal · 4.0mm · 0.55mm/px · 3 of 13 slices shown (1 of 2)]
[im 1/13]
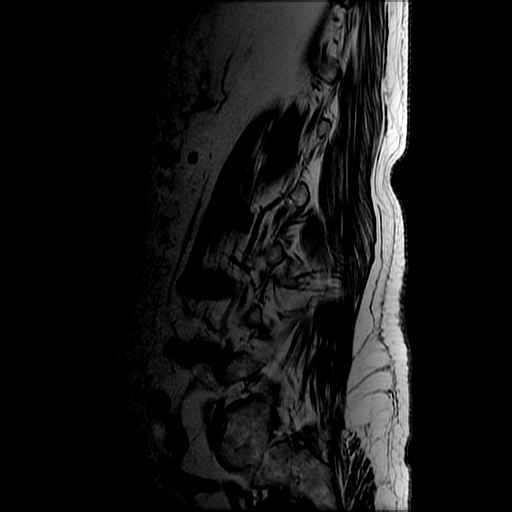
[im 7/13]
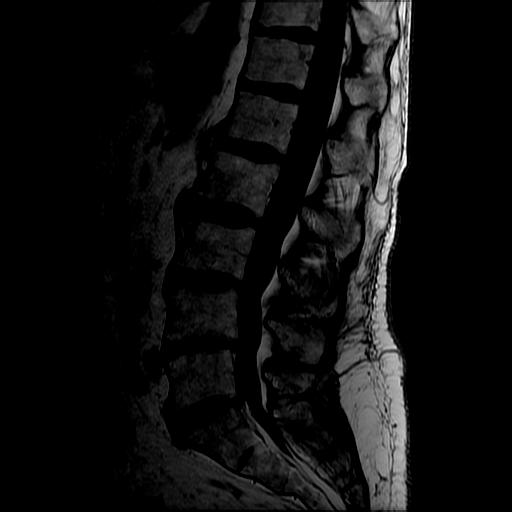
[im 13/13]
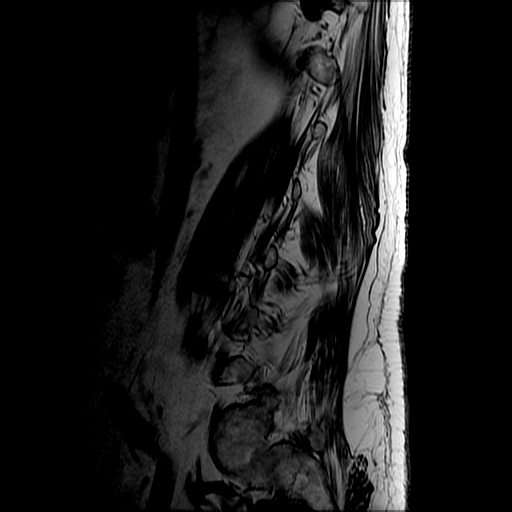

[Series 6: T2 · axial · 4.0mm · 0.41mm/px · z∈[-37,+140]mm · 7 of 39 slices shown (2 of 2)]
[im 3/39]
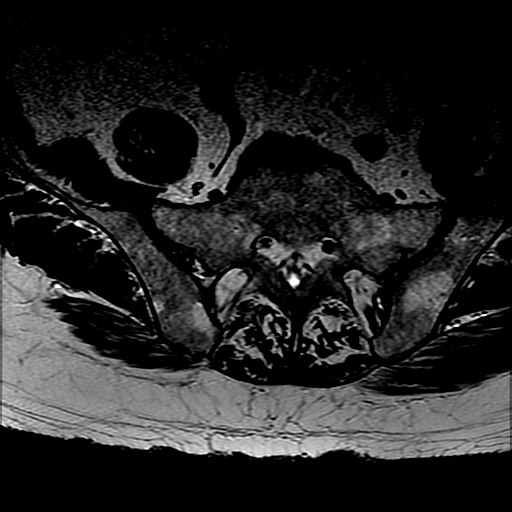
[im 6/39]
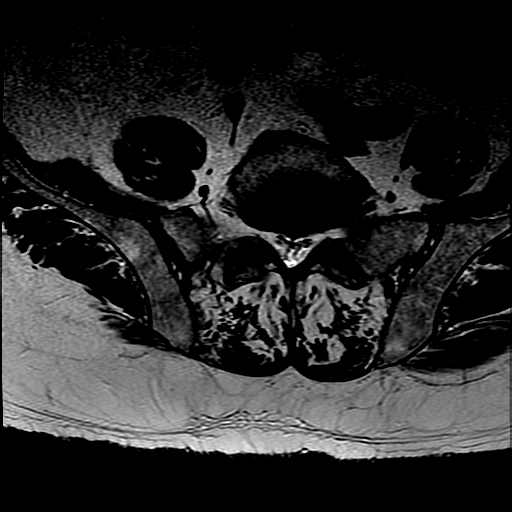
[im 8/39]
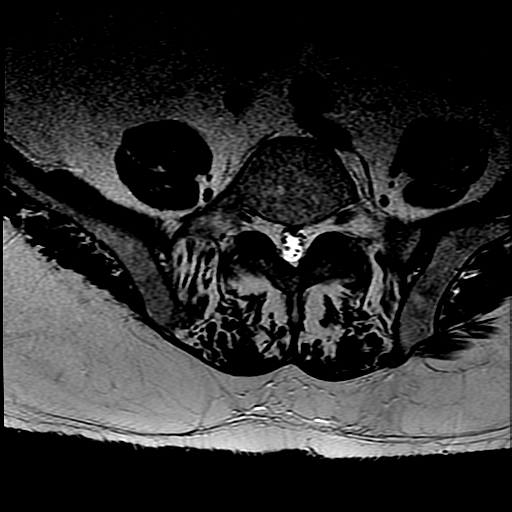
[im 13/39]
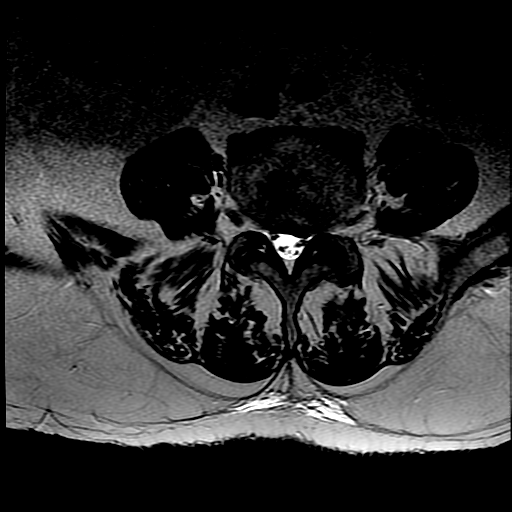
[im 18/39]
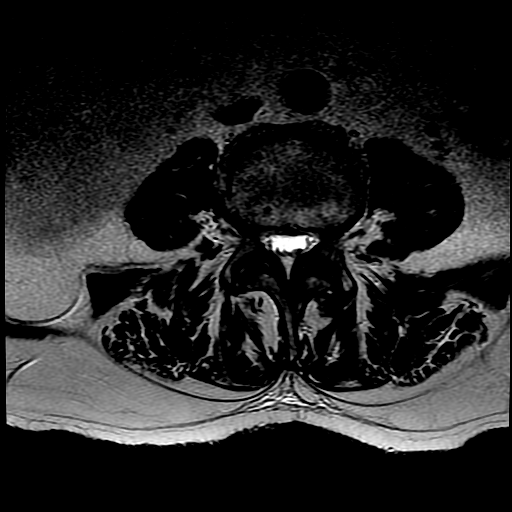
[im 21/39]
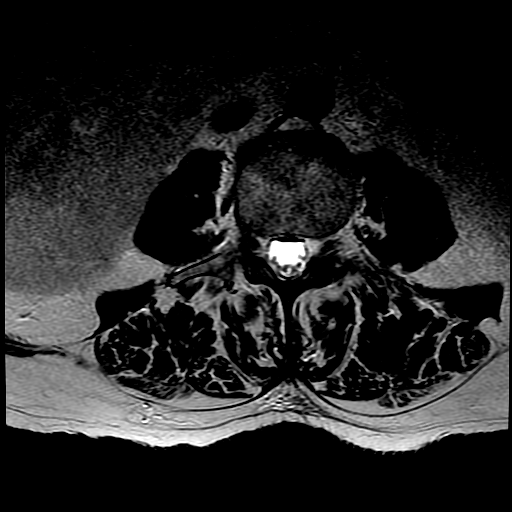
[im 33/39]
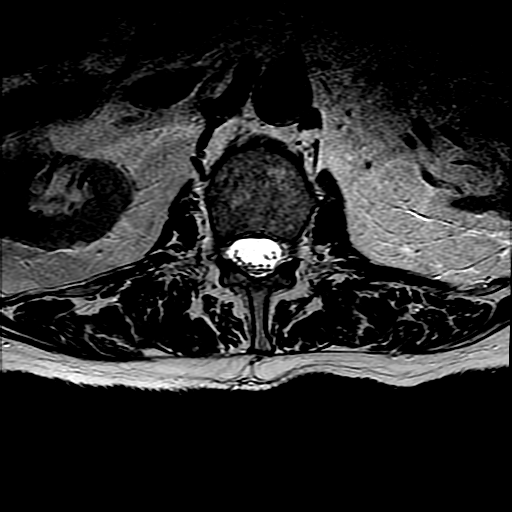

[Series 7: T1 · axial · 4.0mm · 0.43mm/px · z∈[-21,+142]mm · 3 of 39 slices shown (2 of 2)]
[im 6/39]
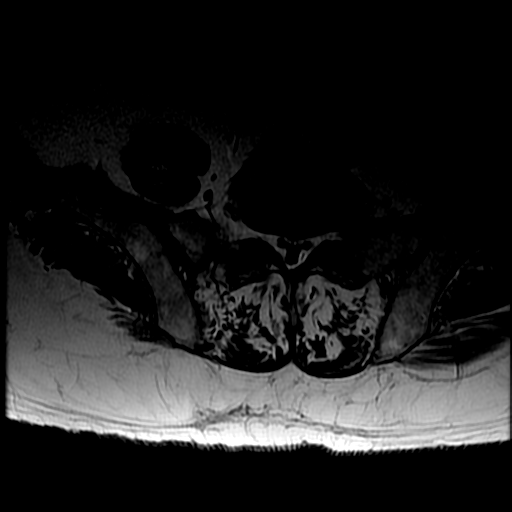
[im 21/39]
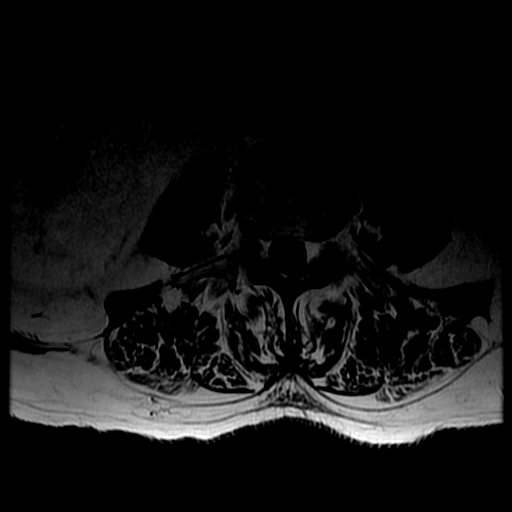
[im 33/39]
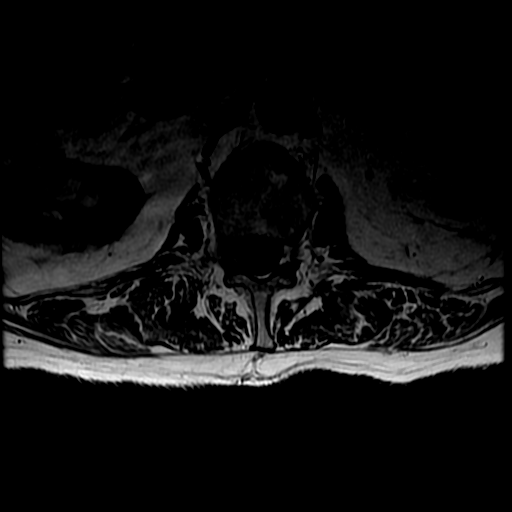

[19 of 48 positions shown; findings below may reference images not displayed]

FINDINGS: Segmentation: Lumbar segmentation appears to be normal and will be
designated as such for this report.

Alignment: Mild straightening of lumbar lordosis. No
spondylolisthesis.

Vertebrae: No marrow edema or evidence of acute osseous abnormality.
Visualized bone marrow signal is within normal limits. Intact
visible sacrum and SI joints.

Conus medullaris and cauda equina: Conus extends to the T12 level.
No lower spinal cord or conus signal abnormality.

Paraspinal and other soft tissues: Negative.

Disc levels:

T11-T12 through L1-L2 are normal for age.

L2-L3: Mostly anterior disc bulge and endplate spurring. Mild to
moderate facet hypertrophy. No stenosis.

L3-L4: Disc space loss with vacuum disc. Mild circumferential disc
bulge. Moderate posterior element hypertrophy. Mild left and
moderate right L3 foraminal stenosis. No significant spinal
stenosis.

L4-L5: Disc space loss with left eccentric circumferential disc
osteophyte complex. Moderate posterior element hypertrophy. Mild to
moderate left lateral recess stenosis (left L5 nerve level). Mild
left L4 foraminal stenosis. No significant spinal stenosis.

L5-S1: Mild circumferential disc bulge. Moderate facet hypertrophy.
Epidural lipomatosis effaces CSF from the thecal sac at this level.
No other stenosis.
IMPRESSION: 1. Advanced chronic disc degeneration in the lower lumbar spine and
up to moderate posterior element hypertrophy but no significant
spinal stenosis.
2. Up to moderate lateral recess stenosis at the left L5 nerve
level, and moderate foraminal stenosis at the right L3 nerve level.

## 2020-05-19 DIAGNOSIS — M2550 Pain in unspecified joint: Secondary | ICD-10-CM | POA: Diagnosis not present

## 2020-05-19 DIAGNOSIS — Z6829 Body mass index (BMI) 29.0-29.9, adult: Secondary | ICD-10-CM | POA: Diagnosis not present

## 2020-05-19 DIAGNOSIS — M1A09X0 Idiopathic chronic gout, multiple sites, without tophus (tophi): Secondary | ICD-10-CM | POA: Diagnosis not present

## 2020-05-19 DIAGNOSIS — N1832 Chronic kidney disease, stage 3b: Secondary | ICD-10-CM | POA: Diagnosis not present

## 2020-05-19 DIAGNOSIS — E663 Overweight: Secondary | ICD-10-CM | POA: Diagnosis not present

## 2020-05-19 DIAGNOSIS — M150 Primary generalized (osteo)arthritis: Secondary | ICD-10-CM | POA: Diagnosis not present

## 2020-06-07 DIAGNOSIS — E785 Hyperlipidemia, unspecified: Secondary | ICD-10-CM | POA: Diagnosis not present

## 2020-06-07 DIAGNOSIS — I129 Hypertensive chronic kidney disease with stage 1 through stage 4 chronic kidney disease, or unspecified chronic kidney disease: Secondary | ICD-10-CM | POA: Diagnosis not present

## 2020-06-07 DIAGNOSIS — N1831 Chronic kidney disease, stage 3a: Secondary | ICD-10-CM | POA: Diagnosis not present

## 2020-06-24 ENCOUNTER — Telehealth: Payer: Self-pay | Admitting: Internal Medicine

## 2020-06-24 NOTE — Telephone Encounter (Signed)
Documentation for order. I have office note from Dr Wilburn Cornelia ENT, evaluating for possible Inspire nerve stimulator to treat OSA. Dr Wilburn Cornelia asks for updated sleep study.  Please order HST   dx OSA

## 2020-06-24 NOTE — Telephone Encounter (Signed)
Called and spoke with patient to let him know that we received a message from Dr. Wilburn Cornelia that patient needed an updated sleep study. Patient and his wife stated that he has decided not to per sue the Inspire device since it is a surgery. Informed him that I would pass the information along to Dr. Annamaria Boots and Dr. Wilburn Cornelia. Nothing further needed at this time.

## 2020-07-13 ENCOUNTER — Ambulatory Visit
Admission: RE | Admit: 2020-07-13 | Discharge: 2020-07-13 | Disposition: A | Discharge status: Home / Self Care | Payer: Medicare Other | Source: Ambulatory Visit | Attending: Registered Nurse | Admitting: Registered Nurse

## 2020-07-13 ENCOUNTER — Other Ambulatory Visit: Payer: Self-pay | Admitting: Registered Nurse

## 2020-07-13 ENCOUNTER — Encounter (HOSPITAL_COMMUNITY): Payer: Self-pay | Admitting: Emergency Medicine

## 2020-07-13 ENCOUNTER — Inpatient Hospital Stay (HOSPITAL_COMMUNITY)
Admission: EM | Admit: 2020-07-13 | Discharge: 2020-07-15 | DRG: 339 | Disposition: A | Discharge status: Home / Self Care | Payer: Medicare Other | Attending: Internal Medicine | Admitting: Internal Medicine

## 2020-07-13 DIAGNOSIS — N179 Acute kidney failure, unspecified: Secondary | ICD-10-CM | POA: Diagnosis not present

## 2020-07-13 DIAGNOSIS — G4733 Obstructive sleep apnea (adult) (pediatric): Secondary | ICD-10-CM | POA: Diagnosis present

## 2020-07-13 DIAGNOSIS — Z9989 Dependence on other enabling machines and devices: Secondary | ICD-10-CM | POA: Diagnosis not present

## 2020-07-13 DIAGNOSIS — R109 Unspecified abdominal pain: Secondary | ICD-10-CM | POA: Diagnosis present

## 2020-07-13 DIAGNOSIS — Z7901 Long term (current) use of anticoagulants: Secondary | ICD-10-CM | POA: Diagnosis not present

## 2020-07-13 DIAGNOSIS — Z20822 Contact with and (suspected) exposure to covid-19: Secondary | ICD-10-CM | POA: Diagnosis present

## 2020-07-13 DIAGNOSIS — I10 Essential (primary) hypertension: Secondary | ICD-10-CM | POA: Diagnosis present

## 2020-07-13 DIAGNOSIS — N1831 Chronic kidney disease, stage 3a: Secondary | ICD-10-CM | POA: Diagnosis not present

## 2020-07-13 DIAGNOSIS — E876 Hypokalemia: Secondary | ICD-10-CM | POA: Diagnosis not present

## 2020-07-13 DIAGNOSIS — E7800 Pure hypercholesterolemia, unspecified: Secondary | ICD-10-CM

## 2020-07-13 DIAGNOSIS — Z8673 Personal history of transient ischemic attack (TIA), and cerebral infarction without residual deficits: Secondary | ICD-10-CM

## 2020-07-13 DIAGNOSIS — Z880 Allergy status to penicillin: Secondary | ICD-10-CM | POA: Diagnosis not present

## 2020-07-13 DIAGNOSIS — Z7982 Long term (current) use of aspirin: Secondary | ICD-10-CM

## 2020-07-13 DIAGNOSIS — K3533 Acute appendicitis with perforation and localized peritonitis, with abscess: Secondary | ICD-10-CM | POA: Diagnosis not present

## 2020-07-13 DIAGNOSIS — Z79899 Other long term (current) drug therapy: Secondary | ICD-10-CM | POA: Diagnosis not present

## 2020-07-13 DIAGNOSIS — K35891 Other acute appendicitis without perforation, with gangrene: Secondary | ICD-10-CM | POA: Diagnosis not present

## 2020-07-13 DIAGNOSIS — Z8249 Family history of ischemic heart disease and other diseases of the circulatory system: Secondary | ICD-10-CM | POA: Diagnosis not present

## 2020-07-13 DIAGNOSIS — R1031 Right lower quadrant pain: Secondary | ICD-10-CM

## 2020-07-13 DIAGNOSIS — Z974 Presence of external hearing-aid: Secondary | ICD-10-CM

## 2020-07-13 DIAGNOSIS — I700 Atherosclerosis of aorta: Secondary | ICD-10-CM | POA: Diagnosis not present

## 2020-07-13 DIAGNOSIS — F0390 Unspecified dementia without behavioral disturbance: Secondary | ICD-10-CM | POA: Diagnosis not present

## 2020-07-13 DIAGNOSIS — K529 Noninfective gastroenteritis and colitis, unspecified: Secondary | ICD-10-CM | POA: Diagnosis not present

## 2020-07-13 DIAGNOSIS — M1990 Unspecified osteoarthritis, unspecified site: Secondary | ICD-10-CM | POA: Diagnosis not present

## 2020-07-13 DIAGNOSIS — Z950 Presence of cardiac pacemaker: Secondary | ICD-10-CM

## 2020-07-13 DIAGNOSIS — I442 Atrioventricular block, complete: Secondary | ICD-10-CM | POA: Diagnosis present

## 2020-07-13 DIAGNOSIS — I4821 Permanent atrial fibrillation: Secondary | ICD-10-CM | POA: Diagnosis not present

## 2020-07-13 DIAGNOSIS — K3580 Unspecified acute appendicitis: Secondary | ICD-10-CM | POA: Diagnosis not present

## 2020-07-13 DIAGNOSIS — K353 Acute appendicitis with localized peritonitis, without perforation or gangrene: Secondary | ICD-10-CM | POA: Diagnosis not present

## 2020-07-13 DIAGNOSIS — E785 Hyperlipidemia, unspecified: Secondary | ICD-10-CM | POA: Diagnosis present

## 2020-07-13 DIAGNOSIS — K5730 Diverticulosis of large intestine without perforation or abscess without bleeding: Secondary | ICD-10-CM | POA: Diagnosis not present

## 2020-07-13 DIAGNOSIS — Z888 Allergy status to other drugs, medicaments and biological substances status: Secondary | ICD-10-CM

## 2020-07-13 DIAGNOSIS — R1011 Right upper quadrant pain: Secondary | ICD-10-CM | POA: Diagnosis not present

## 2020-07-13 DIAGNOSIS — I129 Hypertensive chronic kidney disease with stage 1 through stage 4 chronic kidney disease, or unspecified chronic kidney disease: Secondary | ICD-10-CM | POA: Diagnosis not present

## 2020-07-13 DIAGNOSIS — I480 Paroxysmal atrial fibrillation: Secondary | ICD-10-CM | POA: Diagnosis not present

## 2020-07-13 DIAGNOSIS — D72825 Bandemia: Secondary | ICD-10-CM | POA: Diagnosis not present

## 2020-07-13 LAB — CBC WITH DIFFERENTIAL/PLATELET
Abs Immature Granulocytes: 0.11 10*3/uL — ABNORMAL HIGH (ref 0.00–0.07)
Basophils Absolute: 0.1 10*3/uL (ref 0.0–0.1)
Basophils Relative: 0 %
Eosinophils Absolute: 0 10*3/uL (ref 0.0–0.5)
Eosinophils Relative: 0 %
HCT: 39.9 % (ref 39.0–52.0)
Hemoglobin: 13.3 g/dL (ref 13.0–17.0)
Immature Granulocytes: 1 %
Lymphocytes Relative: 8 %
Lymphs Abs: 1.5 10*3/uL (ref 0.7–4.0)
MCH: 30.6 pg (ref 26.0–34.0)
MCHC: 33.3 g/dL (ref 30.0–36.0)
MCV: 91.9 fL (ref 80.0–100.0)
Monocytes Absolute: 1.6 10*3/uL — ABNORMAL HIGH (ref 0.1–1.0)
Monocytes Relative: 9 %
Neutro Abs: 15.3 10*3/uL — ABNORMAL HIGH (ref 1.7–7.7)
Neutrophils Relative %: 82 %
Platelets: 185 10*3/uL (ref 150–400)
RBC: 4.34 MIL/uL (ref 4.22–5.81)
RDW: 13.8 % (ref 11.5–15.5)
WBC: 18.5 10*3/uL — ABNORMAL HIGH (ref 4.0–10.5)
nRBC: 0 % (ref 0.0–0.2)

## 2020-07-13 LAB — PROTIME-INR
INR: 1.5 — ABNORMAL HIGH (ref 0.8–1.2)
Prothrombin Time: 18.1 seconds — ABNORMAL HIGH (ref 11.4–15.2)

## 2020-07-13 LAB — COMPREHENSIVE METABOLIC PANEL
ALT: 11 U/L (ref 0–44)
AST: 18 U/L (ref 15–41)
Albumin: 3.3 g/dL — ABNORMAL LOW (ref 3.5–5.0)
Alkaline Phosphatase: 56 U/L (ref 38–126)
Anion gap: 10 (ref 5–15)
BUN: 39 mg/dL — ABNORMAL HIGH (ref 8–23)
CO2: 25 mmol/L (ref 22–32)
Calcium: 9.1 mg/dL (ref 8.9–10.3)
Chloride: 100 mmol/L (ref 98–111)
Creatinine, Ser: 1.67 mg/dL — ABNORMAL HIGH (ref 0.61–1.24)
GFR, Estimated: 40 mL/min — ABNORMAL LOW (ref >60)
Glucose, Bld: 98 mg/dL (ref 70–99)
Potassium: 3.3 mmol/L — ABNORMAL LOW (ref 3.5–5.1)
Sodium: 135 mmol/L (ref 135–145)
Total Bilirubin: 1.4 mg/dL — ABNORMAL HIGH (ref 0.3–1.2)
Total Protein: 7.2 g/dL (ref 6.5–8.1)

## 2020-07-13 LAB — RESP PANEL BY RT-PCR (FLU A&B, COVID) ARPGX2
Influenza A by PCR: NEGATIVE
Influenza B by PCR: NEGATIVE
SARS Coronavirus 2 by RT PCR: NEGATIVE

## 2020-07-13 LAB — LIPASE, BLOOD: Lipase: 43 U/L (ref 11–51)

## 2020-07-13 LAB — APTT: aPTT: 36 seconds (ref 24–36)

## 2020-07-13 MED ORDER — FENTANYL CITRATE (PF) 100 MCG/2ML IJ SOLN: 12.5000 ug | INTRAMUSCULAR | Status: DC | PRN

## 2020-07-13 MED ORDER — LACTATED RINGERS IV SOLN: INTRAVENOUS | Status: DC

## 2020-07-13 MED ORDER — SODIUM CHLORIDE 0.9 % IV SOLN
2.0000 g | Freq: Once | INTRAVENOUS | Status: AC
  Administered 2020-07-13: 2 g via INTRAVENOUS
  Filled 2020-07-13: qty 2

## 2020-07-13 MED ORDER — POTASSIUM CHLORIDE 10 MEQ/100ML IV SOLN
10.0000 meq | INTRAVENOUS | Status: AC
  Administered 2020-07-13 (×3): 10 meq via INTRAVENOUS
  Filled 2020-07-13 (×3): qty 100

## 2020-07-13 MED ORDER — ACETAMINOPHEN 325 MG PO TABS
650.0000 mg | ORAL_TABLET | Freq: Four times a day (QID) | ORAL | Status: DC | PRN
  Administered 2020-07-15: 650 mg via ORAL
  Filled 2020-07-13: qty 2

## 2020-07-13 MED ORDER — SODIUM CHLORIDE 0.9 % IV BOLUS
500.0000 mL | Freq: Once | INTRAVENOUS | Status: AC
  Administered 2020-07-13: 500 mL via INTRAVENOUS

## 2020-07-13 MED ORDER — ONDANSETRON HCL 4 MG/2ML IJ SOLN: 4.0000 mg | Freq: Four times a day (QID) | INTRAMUSCULAR | Status: DC | PRN

## 2020-07-13 MED ORDER — SODIUM CHLORIDE 0.9 % IV SOLN
2.0000 g | Freq: Two times a day (BID) | INTRAVENOUS | Status: DC
  Administered 2020-07-14: 2 g via INTRAVENOUS
  Filled 2020-07-13: qty 2

## 2020-07-13 MED ORDER — IOPAMIDOL (ISOVUE-300) INJECTION 61%
80.0000 mL | Freq: Once | INTRAVENOUS | Status: AC | PRN
  Administered 2020-07-13: 80 mL via INTRAVENOUS

## 2020-07-13 MED ORDER — ACETAMINOPHEN 650 MG RE SUPP: 650.0000 mg | Freq: Four times a day (QID) | RECTAL | Status: DC | PRN

## 2020-07-13 MED ORDER — METRONIDAZOLE 500 MG/100ML IV SOLN
500.0000 mg | Freq: Once | INTRAVENOUS | Status: AC
  Administered 2020-07-13: 500 mg via INTRAVENOUS
  Filled 2020-07-13: qty 100

## 2020-07-13 MED ORDER — METRONIDAZOLE 500 MG/100ML IV SOLN
500.0000 mg | Freq: Three times a day (TID) | INTRAVENOUS | Status: DC
  Administered 2020-07-14 – 2020-07-15 (×5): 500 mg via INTRAVENOUS
  Filled 2020-07-13 (×5): qty 100

## 2020-07-13 NOTE — ED Provider Notes (Signed)
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 fib, hypertension, hyperlipidemia, memory deficits, complete heart block, pacemaker dependent who presents for evaluation of right lower abdominal pain.  Pain began 3 days ago.  Had CT scan done today in the outpatient setting which showed acute appendicitis with tiny periapical abscess.  Patient denies fever, chills, nausea, vomiting, diarrhea, melena, bright red per rectum.  He did not take his Eliquis this morning.  Last p.o. intake was this morning.  No prior abdominal surgeries.  Patient rates his current pain a 4/10.  He does not want a thing for pain.  Wife states his memory problems are at baseline.  Denies additional aggravating or alleviating factors.  History obtained from patient, wife in room and past medical records.  No interpreter used  HPI     Past Medical History:  Diagnosis Date   Arthritis    Atrial fibrillation ([place])    Dysrhythmia    AF   Hyperlipemia    Hypertension    Memory difficulty 12/16/2017   Pacemaker    Pacemaker    Sinus bradycardia seen on cardiac monitor    Sleep apnea    does use his cpap   Wears glasses    Wears hearing aid     Patient Active Problem List   Diagnosis Date Noted   Acute appendicitis 07/13/2020   Memory difficulty 12/16/2017   Long term current use of anticoagulant 12/24/2016   Nonallergic vasomotor rhinitis 02/07/2015   Acute bronchitis 02/04/2014   Finger osteomyelitis ([place]) 08/21/2013   Complete heart block ([place]) 06/12/2012   Pacemaker 06/12/2012   Atrial fibrillation, permanent ([place]) 06/12/2012   History of transient ischemic attack 06/12/2012   Essential hypertension 06/12/2012   Hyperlipidemia 07/23/2008   Obstructive sleep apnea 07/23/2008    Past Surgical History:   Procedure Laterality Date   CARDIOVERSION     multiple times-pacer put in 2009   COLONOSCOPY     I & D EXTREMITY Left 04/20/2013   Procedure: LEFT HAND WOUND DEBRIDEMENT AND CLOSURE ;  Surgeon: Jolyn Nap, MD;  Location: [place];  Service: Orthopedics;  Laterality: Left;   KNEE ARTHROSCOPY  2/10   left   MASS EXCISION  01/29/2012   Procedure: EXCISION MASS;  Surgeon: Hessie Dibble, MD;  Location: [place];  Service: Orthopedics;  Laterality: Left;  excision of bone spur left hand   METACARPAL OSTEOTOMY Left 01/13/2013   Procedure: LEFT INDEX FINGER MCP RADIAL COLLATERAL REPAIR/RECONSTRUCTION VS METACARPAL ROTATIONAL OSTEOTOMY;  Surgeon: Jolyn Nap, MD;  Location: [place];  Service: Orthopedics;  Laterality: Left;   PACEMAKER INSERTION  2009   PPM GENERATOR CHANGEOUT N/A 09/26/2016   Procedure: PPM Generator Changeout;  Surgeon: Sanda Klein, MD;  Location: [place] [facility];  Service: Cardiovascular;  Laterality: N/A;   SHOULDER ARTHROSCOPY  2004   left   THUMB ARTHROSCOPY  2007   rt thunb mass   TONSILLECTOMY         Family History  Problem Relation Age of Onset   Dementia Mother    Stroke Father    Hypertension Brother    Diabetes Brother    Diabetes Maternal Grandmother    Stroke Paternal Grandfather    Emphysema Other  Heart disease Other     Social History   Tobacco Use   Smoking status: Never   Smokeless tobacco: Never  Vaping Use   Vaping Use: Never used  Substance Use Topics   Alcohol use: Yes    Alcohol/week: 0.0 standard drinks    Comment: wine 3-4 times a week   Drug use: No    Home Medications Prior to Admission medications   Medication Sig Start Date End Date Taking? Authorizing Provider  acetaminophen (TYLENOL) 325 MG tablet Take 650 mg by mouth every 6 (six) hours as needed. OTC for arthritis.    [provider]  allopurinol (ZYLOPRIM) 300 MG tablet Take by mouth.  01/30/20   [provider]  amLODipine (NORVASC) 5 MG tablet TAKE 1 TABLET BY MOUTH EVERY DAY 01/28/20   Croitoru, Mihai, MD  apixaban (ELIQUIS) 5 MG TABS tablet Take 5 mg by mouth 2 (two) times daily.    [provider]  Arginine 1000 MG TABS Take 1,000 mg by mouth daily.     [provider]  aspirin EC 81 MG tablet Take 81 mg by mouth at bedtime.    [provider]  chlorthalidone (HYGROTON) 25 MG tablet Take 0.5 tablets (12.5 mg total) by mouth daily. 02/22/20   Croitoru, Mihai, MD  Cholecalciferol (VITAMIN D3) 5000 UNITS TABS Take 5,000 Units by mouth daily.     [provider]  colchicine 0.6 MG tablet  01/30/20   [provider]  donepezil (ARICEPT) 10 MG tablet Take 1 tablet (10 mg total) by mouth every morning. 03/10/19   Suzzanne Cloud, NP  ezetimibe (ZETIA) 10 MG tablet Take 1 tablet (10 mg total) by mouth daily. Patient taking differently: Take 10 mg by mouth daily with breakfast. 08/04/13   Croitoru, Mihai, MD  fish oil-omega-3 fatty acids 1000 MG capsule Take 1 g by mouth 2 (two) times daily.     [provider]  Glucosamine-Chondroitin (COSAMIN DS PO) Take by mouth daily with breakfast.    [provider]  memantine (NAMENDA) 10 MG tablet Take 1 tablet (10 mg total) by mouth 2 (two) times daily. 03/10/19   Suzzanne Cloud, NP  Nebivolol HCl 20 MG TABS TAKE 2 TABLET BY MOUTH EVERY DAY 01/28/20   Croitoru, [name], MD  niacin (SLO-NIACIN) 500 MG tablet Take 500 mg by mouth 2 (two) times daily with a meal.    [provider]  olmesartan (BENICAR) 40 MG tablet Take 1 tablet by mouth daily. 12/19/16   [provider]  potassium chloride SA (KLOR-CON) 20 MEQ tablet TAKE 1 TABLET BY MOUTH daily 01/28/20   Croitoru, [name], MD    Allergies    Lipitor [atorvastatin], Penicillins, and Vytorin [ezetimibe-simvastatin]  Review of Systems   Review of Systems  Constitutional: Negative.   HENT: Negative.     Respiratory: Negative.    Cardiovascular: Negative.   Gastrointestinal:  Positive for abdominal pain. Negative for abdominal distention, blood in stool, constipation, diarrhea, nausea, rectal pain and vomiting.  Genitourinary: Negative.   Musculoskeletal: Negative.   Neurological: Negative.   All other systems reviewed and are negative.  Physical Exam Updated Vital Signs BP (!) 111/54 (BP Location: Right Arm)   Pulse (!) 57   Temp 98.8 F (37.1 C) (Oral)   Resp 15   Ht 5\' 11"  (1.803 m)   Wt 81.6 kg   SpO2 99%   BMI 25.10 kg/m   Physical Exam Vitals and nursing note reviewed.  Constitutional:      General: He is not in acute distress.    Appearance: He is well-developed. He is not ill-appearing, toxic-appearing or diaphoretic.  HENT:     Head: Normocephalic and atraumatic.     Nose: Nose normal.     Mouth/Throat:     Mouth: Mucous membranes are moist.  Eyes:     Pupils: Pupils are equal, round, and reactive to light.  Cardiovascular:     Rate and Rhythm: Normal rate and regular rhythm.     Pulses: Normal pulses.     Heart sounds: Normal heart sounds.  Pulmonary:     Effort: Pulmonary effort is normal. No respiratory distress.     Breath sounds: Normal breath sounds.     Comments: Clear to auscultation bilaterally.  Speaks in full sentences without difficulty Chest:     Comments: Pacemaker to chest wall without overlying erythema, warm Abdominal:     General: Bowel sounds are normal. There is no distension.     Palpations: Abdomen is soft.     Tenderness: There is abdominal tenderness in the right lower quadrant. There is guarding. There is no right CVA tenderness, left CVA tenderness or rebound. Positive signs include McBurney's sign.       Comments: Diffuse tenderness to right abdomen, positive McBurney point  Musculoskeletal:        General: No swelling, tenderness, deformity or signs of injury. Normal range of motion.     Cervical back: Normal range of motion  and neck supple.     Right lower leg: No edema.     Left lower leg: No edema.  Skin:    General: Skin is warm and dry.     Capillary Refill: Capillary refill takes less than 2 seconds.  Neurological:     General: No focal deficit present.     Mental Status: He is alert and oriented to person, place, and time. Mental status is at baseline.    ED Results / Procedures / Treatments   Labs (all labs ordered are listed, but only abnormal results are displayed) Labs Reviewed  CBC WITH DIFFERENTIAL/PLATELET - Abnormal; Notable for the following components:      Result Value   WBC 18.5 (*)    Neutro Abs 15.3 (*)    Monocytes Absolute 1.6 (*)    Abs Immature Granulocytes 0.11 (*)    All other components within normal limits  COMPREHENSIVE METABOLIC PANEL - Abnormal; Notable for the following components:   Potassium 3.3 (*)    BUN 39 (*)    Creatinine, Ser 1.67 (*)    Albumin 3.3 (*)    Total Bilirubin 1.4 (*)    GFR, Estimated 40 (*)    All other components within normal limits  PROTIME-INR - Abnormal; Notable for the following components:   Prothrombin Time 18.1 (*)    INR 1.5 (*)    All other components within normal limits  RESP PANEL BY RT-PCR (FLU A&B, COVID) ARPGX2  LIPASE, BLOOD  APTT  URINALYSIS, ROUTINE W REFLEX MICROSCOPIC  MAGNESIUM  MAGNESIUM  COMPREHENSIVE METABOLIC PANEL  CBC WITH DIFFERENTIAL/PLATELET    EKG None  Radiology CT ABDOMEN PELVIS W CONTRAST  Result Date: 07/13/2020 CLINICAL DATA:  Right lower quadrant pain for 3 days. EXAM: CT ABDOMEN AND PELVIS WITH CONTRAST TECHNIQUE: Multidetector CT imaging of the abdomen and pelvis was performed using the standard protocol following bolus administration of intravenous contrast. CONTRAST:  70mL ISOVUE-300 IOPAMIDOL (ISOVUE-300) INJECTION 61% COMPARISON:  None.  FINDINGS: Lower Chest: No acute findings. Hepatobiliary: No hepatic masses identified. Gallbladder is unremarkable. No evidence of biliary ductal  dilatation. Pancreas:  No mass or inflammatory changes. Spleen: Within normal limits in size and appearance. Adrenals/Urinary Tract: No masses identified. Mild bilateral renal parenchymal scarring noted. No evidence of ureteral calculi or hydronephrosis. Stomach/Bowel: Findings of acute appendicitis with moderate periappendiceal inflammatory changes are noted. Details as follows: Appendix: Location- Retrocecal Diameter-10 mm Appendicolith- Absent Mucosal hyper-enhancement- Present Extraluminal Gas- Absent Periappendiceal Collection-tiny fluid collection is seen along the posterior aspect of the appendix measuring 2.2 x 0.9 cm, suspicious for abscess. Diffuse colonic diverticulosis is seen with greatest involvement of the sigmoid colon. No evidence of diverticulitis. Vascular/Lymphatic: No pathologically enlarged lymph nodes. No acute vascular findings. Aortic atherosclerotic calcification noted. Reproductive:  No mass or other significant abnormality. Other:  None. Musculoskeletal:  No suspicious bone lesions identified. IMPRESSION: Positive for acute appendicitis, with tiny periappendiceal abscess. Colonic diverticulosis, without radiographic evidence of diverticulitis. Aortic Atherosclerosis (ICD10-I70.0). These results will be called to the ordering clinician or representative by the Radiologist Assistant, and communication documented in the PACS or [employer]. Electronically Signed   By: Marlaine Hind M.D.   On: 07/13/2020 14:10    Procedures Procedures   Medications Ordered in ED Medications  ceFEPIme (MAXIPIME) 2 g in sodium chloride 0.9 % 100 mL IVPB (2 g Intravenous New Bag/Given 07/13/20 1907)    And  metroNIDAZOLE (FLAGYL) IVPB 500 mg (500 mg Intravenous New Bag/Given 07/13/20 1856)  sodium chloride 0.9 % bolus 500 mL (has no administration in time range)  lactated ringers infusion (has no administration in time range)  acetaminophen (TYLENOL) tablet 650 mg (has no administration in time  range)    Or  acetaminophen (TYLENOL) suppository 650 mg (has no administration in time range)    ED Course  I have reviewed the triage vital signs and the nursing notes.  Pertinent labs & imaging results that were available during my care of the patient were reviewed by me and considered in my medical decision making (see chart for details).  87 year old here for evaluation of right lower quadrant pain.  Seen in the outpatient setting and had CT scan today.  He is afebrile, nonseptic, not ill-appearing.  CT scan positive for acute appendicitis with tiny periapical abscess.  He is chronically anticoagulated however last dose yesterday.  His heart and lungs are clear.  He has positive McBurney sign.  He does not appear septic.  Does have penicillin allergy, which is a rash.  We will do Cefepime and Flagyl.  Soft BP however appears to be at his baseline.  Has some memory issues which wife states is at baseline.  Low suspicion acute encephalopathy.  He does not want anything for pain.  Labs and imaging personally reviewed and interpreted:  CT with acute appy with tiny abscess CBC with leukocytosis at 18.5 CMP with creatinine at 1.67 up from prior Lipase 43 COVID pending  CONSULT with Dr. Rosendo Gros with surgery. Rec Abx, medicine admit and will follow. Will need Cards clearance prior to surgery  CONSULT with Dr. Velia Meyer with [facility] who will evaluate patient for admission  Patient reassessed. Continues to decline need for pain meds. Discussed plan with patient and wife in room. Agreeable with admission.  The patient appears reasonably stabilized for admission considering the current resources, flow, and capabilities available in the ED at this time, and I doubt any other [facility] requiring further screening and/or treatment in the ED prior to  admission.   Patient seen and evaluated by attending Dr. Gilford Raid who is in agreement with above treatment, plan and disposition.     MDM  Rules/Calculators/A&P                           Final Clinical Impression(s) / ED Diagnoses Final diagnoses:  Acute appendicitis, unspecified acute appendicitis type  Chronic anticoagulation    Rx / DC Orders ED Discharge Orders     None        Tressie Ragin A, PA-C 07/13/20 1950    Isla Pence, MD 07/13/20 2238

## 2020-07-13 NOTE — ED Provider Notes (Signed)
Emergency Medicine Provider Triage Evaluation Note  Dean Morgan, Dean Morgan , a 87 y.o. male  was evaluated in triage.  Pt complains of right lower quadrant abdominal pain patient has been for the last 3 days status post patient had CT scanning done in outpatient setting today which showed acute appendicitis, with tiny periappendiceal abscess.    Patient denies any fevers, chills, nausea, vomiting, diarrhea, blood in stool, melena.  Patient is did not take his medications today.  Review of Systems  Positive: Abd pain Negative: fevers, chills, nausea, vomiting, diarrhea, blood in stool, melena.  Physical Exam  BP (!) 97/56 (BP Location: Right Arm)   Pulse 64   Temp 98.8 F (37.1 C) (Oral)   Resp 17   SpO2 97%  Gen:   Awake, no distress   Resp:  Normal effort  MSK:   Moves extremities without difficulty  Other:  Abdomen soft, nondistended, tenderness to right lower quadrant.  Medical Decision Making  Medically screening exam initiated at 5:02 PM.  Appropriate orders placed.  Dean Morgan, Dean Morgan was informed that the remainder of the evaluation will be completed by another provider, this initial triage assessment does not replace that evaluation, and the importance of remaining in the ED until their evaluation is complete.  Will have patient back to next available bed due to positive acute appendicitis as well as bp of 97/56.  Charge RN aware   Dyann Ruddle 07/13/20 1705    Lorelle Gibbs, DO 07/13/20 2228

## 2020-07-13 NOTE — H&P (Signed)
History and Physical    PLEASE NOTE THAT DRAGON DICTATION SOFTWARE WAS USED IN THE CONSTRUCTION OF THIS NOTE.   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 male with medical history significant for permanent atrial fibrillation chronically anticoagulated on Eliquis, complete heart block status post pacemaker, hypertension, hyperlipidemia, who is admitted to [facility] on 07/13/2020 with acute appendicitis after presenting from home to [facility] ED complaining of abdominal pain.   The patient reports 3 days of progressive right lower quadrant abdominal pain.  Describes the pain is constant over that timeframe, with gradual worsening of associated intensity over the last 3 days.  Describes the pain as sharp, nonradiating, and is associated with exacerbation with palpation over the abdomen.  Denies any prior history of similar abdominal discomfort.  Denies any preceding trauma.  While he denies any associated nausea or vomiting, he reports diminished oral intake over the course of the last 3 days in the setting of decline in appetite over that timeframe.  Denies any associated diarrhea, melena, or hematochezia.  No recent traveling.  Denies any associated subjective fever, chills, rigors, or generalized myalgias. Denies any recent headache, neck stiffness, rhinitis, rhinorrhea, sore throat, sob, wheezing, cough, or rash. No recent known COVID-19 exposures. Denies dysuria, gross hematuria, or change in urinary urgency/frequency.  Denies any recent chest pain, diaphoresis, palpitations, dizziness, presyncope, syncope, orthopnea, PND, or new onset peripheral edema.  Medical history notable for history of atrial fibrillation for which he is chronically anticoagulated  on Eliquis, with most recent dose occurring on the of 07/13/2020.  Otherwise, denies any additional blood thinners at home, including no aspirin.  Per chart review, it appears that the patient's baseline creatinine is 1.0-1.1, with most recent prior serum creatinine data point noted to be 1.09 in November 2018.  In the setting of the above 3 days of progression of abdominal discomfort, the patient was evaluated by his PCP, who ordered CT abdomen/pelvis with contrast, which performed as an outpatient earlier today, and demonstrated evidence of acute appendicitis with small periappendiceal abscess, but otherwise no evidence of acute intra-abdominal process.  In light of these imaging results, PCP recommended that the patient present to the local emergency department for further evaluation management of suspected acute appendicitis.   ED Course:  Vital signs in the ED were notable for the following: Temperature max 98.8, heart rate 57-64; blood pressure initially noted to be 97/56, which subsequently increased to 111/59 following interval IV fluids, as further described low; respiratory rate 15-20, oxygen saturation 95 to 100% on room air.  Labs were notable for the following: CMP was notable for the following: Sodium 135, potassium 3.3, bicarbonate 25, BUN 39, creatinine 1.67, and liver enzymes were found to be within normal limits.  Lipase 43.  CBC notable for white cell count of 18,500.  Screening nasopharyngeal COVID-19/influenza PCR were checked in the ED this evening and found to be negative.  EKG was suggestive of junctional rhythm with heart rate 63, no evidence of T wave or ST changes.  Patient's case/imaging were discussed with the on-call general surgeon, Dr.Ramirez, Who recommended admission to the hospital service with plan for general surgery consultation. Dr. Rosendo Gros recommends conservative measures overnight, including IV antibiotics, and tentatively plans for appendectomy on  either the  afternoon of 07/14/2020 or on 07/15/2020.   While in the ED, the following were administered: Cefepime, Flagyl, and normal saline times of 500 cc bolus.     Review of Systems: As per HPI otherwise 10 point review of systems negative.   Past Medical History:  Diagnosis Date   Arthritis    Atrial fibrillation ([place])    Dysrhythmia    AF   Hyperlipemia    Hypertension    Memory difficulty 12/16/2017   Pacemaker    Pacemaker    Sinus bradycardia seen on cardiac monitor    Sleep apnea    does use his cpap   Wears glasses    Wears hearing aid     Past Surgical History:  Procedure Laterality Date   CARDIOVERSION     multiple times-pacer put in 2009   COLONOSCOPY     I & D EXTREMITY Left 04/20/2013   Procedure: LEFT HAND WOUND DEBRIDEMENT AND CLOSURE ;  Surgeon: Jolyn Nap, MD;  Location: [place];  Service: Orthopedics;  Laterality: Left;   KNEE ARTHROSCOPY  2/10   left   MASS EXCISION  01/29/2012   Procedure: EXCISION MASS;  Surgeon: Hessie Dibble, MD;  Location: [place];  Service: Orthopedics;  Laterality: Left;  excision of bone spur left hand   METACARPAL OSTEOTOMY Left 01/13/2013   Procedure: LEFT INDEX FINGER MCP RADIAL COLLATERAL REPAIR/RECONSTRUCTION VS METACARPAL ROTATIONAL OSTEOTOMY;  Surgeon: Jolyn Nap, MD;  Location: [place];  Service: Orthopedics;  Laterality: Left;   PACEMAKER INSERTION  2009   PPM GENERATOR CHANGEOUT N/A 09/26/2016   Procedure: PPM Generator Changeout;  Surgeon: Sanda Klein, MD;  Location: [place] [facility];  Service: Cardiovascular;  Laterality: N/A;   SHOULDER ARTHROSCOPY  2004   left   THUMB ARTHROSCOPY  2007   rt thunb mass   TONSILLECTOMY      Social History:  reports that he has never smoked. He has never used smokeless tobacco. He reports current alcohol use. He reports that he does not use drugs.   Allergies  Allergen Reactions   Lipitor [Atorvastatin] Other (See  Comments)    Muscle pain   Penicillins Rash    Has patient had a PCN reaction causing immediate rash, facial/tongue/throat swelling, SOB or lightheadedness with hypotension: Unknown Has patient had a PCN reaction causing severe rash involving mucus membranes or skin necrosis: Unknown Has patient had a PCN reaction that required hospitalization: No Has patient had a PCN reaction occurring within the last 10 years: No If all of the above answers are "NO", then may proceed with Cephalosporin use.    Vytorin [Ezetimibe-Simvastatin] Other (See Comments)    Muscle pain    Family History  Problem Relation Age of Onset   Dementia Mother    Stroke Father    Hypertension Brother    Diabetes Brother    Diabetes Maternal Grandmother    Stroke Paternal Grandfather    Emphysema Other    Heart disease Other     Family history reviewed and not pertinent    Prior to Admission medications   Medication Sig Start Date End Date Taking? Authorizing Provider  acetaminophen (TYLENOL) 325 MG tablet Take 650 mg by mouth every 6 (six) hours as needed. OTC for arthritis.    [provider]  allopurinol (ZYLOPRIM) 300 MG tablet Take by mouth. 01/30/20   [provider]  amLODipine (NORVASC) 5 MG tablet TAKE 1  TABLET BY MOUTH EVERY DAY 01/28/20   Croitoru, [name], MD  apixaban (ELIQUIS) 5 MG TABS tablet Take 5 mg by mouth 2 (two) times daily.    [provider]  Arginine 1000 MG TABS Take 1,000 mg by mouth daily.     [provider]  aspirin EC 81 MG tablet Take 81 mg by mouth at bedtime.    [provider]  chlorthalidone (HYGROTON) 25 MG tablet Take 0.5 tablets (12.5 mg total) by mouth daily. 02/22/20   Croitoru, Mihai, MD  Cholecalciferol (VITAMIN D3) 5000 UNITS TABS Take 5,000 Units by mouth daily.     [provider]  colchicine 0.6 MG tablet  01/30/20   [provider]  donepezil (ARICEPT) 10 MG tablet Take 1 tablet (10 mg total) by mouth every  morning. 03/10/19   Suzzanne Cloud, NP  ezetimibe (ZETIA) 10 MG tablet Take 1 tablet (10 mg total) by mouth daily. Patient taking differently: Take 10 mg by mouth daily with breakfast. 08/04/13   Croitoru, Mihai, MD  fish oil-omega-3 fatty acids 1000 MG capsule Take 1 g by mouth 2 (two) times daily.     [provider]  Glucosamine-Chondroitin (COSAMIN DS PO) Take by mouth daily with breakfast.    [provider]  memantine (NAMENDA) 10 MG tablet Take 1 tablet (10 mg total) by mouth 2 (two) times daily. 03/10/19   Suzzanne Cloud, NP  Nebivolol HCl 20 MG TABS TAKE 2 TABLET BY MOUTH EVERY DAY 01/28/20   Croitoru, [name], MD  niacin (SLO-NIACIN) 500 MG tablet Take 500 mg by mouth 2 (two) times daily with a meal.    [provider]  olmesartan (BENICAR) 40 MG tablet Take 1 tablet by mouth daily. 12/19/16   [provider]  potassium chloride SA (KLOR-CON) 20 MEQ tablet TAKE 1 TABLET BY MOUTH daily 01/28/20   Croitoru, [name], MD     Objective    Physical Exam: Vitals:   07/13/20 1754 07/13/20 1800 07/13/20 1900 07/13/20 1907  BP: 106/61 94/61 (!) 111/54 (!) 111/54  Pulse: 60 (!) 59 (!) 59 (!) 57  Resp: $Remo'19 20 20 15  'HuFjX$ Temp:      TempSrc:      SpO2: 98% 100% 99% 99%  Weight: 81.6 kg     Height: $Remove'5\' 11"'RfXmrJQ$  (1.803 m)       General: appears to be stated age; alert, oriented Skin: warm, dry, no rash Head:  AT/[place] Mouth:  Oral mucosa membranes appear dry, normal dentition Neck: supple; trachea midline Heart:  RRR; did not appreciate any M/R/G Lungs: CTAB, did not appreciate any wheezes, rales, or rhonchi Abdomen: + BS; soft, ND; tenderness to palpation over the RUQ/RLQ, more prominent over the right lower quadrant, in the absence of any associated guarding, rigidity, or rebound tenderness. Vascular: 2+ pedal pulses b/l; 2+ radial pulses b/l Extremities: no peripheral edema, no muscle wasting Neuro: strength and sensation intact in upper and lower extremities  b/l    Labs on Admission: I have personally reviewed following labs and imaging studies  CBC: Recent Labs  Lab 07/13/20 1702  WBC 18.5*  NEUTROABS 15.3*  HGB 13.3  HCT 39.9  MCV 91.9  PLT 494   Basic Metabolic Panel: Recent Labs  Lab 07/13/20 1702  NA 135  K 3.3*  CL 100  CO2 25  GLUCOSE 98  BUN 39*  CREATININE 1.67*  CALCIUM 9.1   GFR: Estimated Creatinine Clearance: 33.8 mL/min (A) (by C-G formula based  on SCr of 1.67 mg/dL (H)). Liver Function Tests: Recent Labs  Lab 07/13/20 1702  AST 18  ALT 11  ALKPHOS 56  BILITOT 1.4*  PROT 7.2  ALBUMIN 3.3*   Recent Labs  Lab 07/13/20 1702  LIPASE 43   No results for input(s): AMMONIA in the last 168 hours. Coagulation Profile: Recent Labs  Lab 07/13/20 1702  INR 1.5*   Cardiac Enzymes: No results for input(s): CKTOTAL, CKMB, CKMBINDEX, TROPONINI in the last 168 hours. BNP (last 3 results) No results for input(s): PROBNP in the last 8760 hours. HbA1C: No results for input(s): HGBA1C in the last 72 hours. CBG: No results for input(s): GLUCAP in the last 168 hours. Lipid Profile: No results for input(s): CHOL, HDL, LDLCALC, TRIG, CHOLHDL, LDLDIRECT in the last 72 hours. Thyroid Function Tests: No results for input(s): TSH, T4TOTAL, FREET4, T3FREE, THYROIDAB in the last 72 hours. Anemia Panel: No results for input(s): VITAMINB12, FOLATE, FERRITIN, TIBC, IRON, RETICCTPCT in the last 72 hours. Urine analysis: No results found for: COLORURINE, APPEARANCEUR, [place], [place], GLUCOSEU, HGBUR, BILIRUBINUR, KETONESUR, PROTEINUR, UROBILINOGEN, NITRITE, LEUKOCYTESUR  Radiological Exams on Admission: CT ABDOMEN PELVIS W CONTRAST  Result Date: 07/13/2020 CLINICAL DATA:  Right lower quadrant pain for 3 days. EXAM: CT ABDOMEN AND PELVIS WITH CONTRAST TECHNIQUE: Multidetector CT imaging of the abdomen and pelvis was performed using the standard protocol following bolus administration of intravenous contrast. CONTRAST:   16mL ISOVUE-300 IOPAMIDOL (ISOVUE-300) INJECTION 61% COMPARISON:  None. FINDINGS: Lower Chest: No acute findings. Hepatobiliary: No hepatic masses identified. Gallbladder is unremarkable. No evidence of biliary ductal dilatation. Pancreas:  No mass or inflammatory changes. Spleen: Within normal limits in size and appearance. Adrenals/Urinary Tract: No masses identified. Mild bilateral renal parenchymal scarring noted. No evidence of ureteral calculi or hydronephrosis. Stomach/Bowel: Findings of acute appendicitis with moderate periappendiceal inflammatory changes are noted. Details as follows: Appendix: Location- Retrocecal Diameter-10 mm Appendicolith- Absent Mucosal hyper-enhancement- Present Extraluminal Gas- Absent Periappendiceal Collection-tiny fluid collection is seen along the posterior aspect of the appendix measuring 2.2 x 0.9 cm, suspicious for abscess. Diffuse colonic diverticulosis is seen with greatest involvement of the sigmoid colon. No evidence of diverticulitis. Vascular/Lymphatic: No pathologically enlarged lymph nodes. No acute vascular findings. Aortic atherosclerotic calcification noted. Reproductive:  No mass or other significant abnormality. Other:  None. Musculoskeletal:  No suspicious bone lesions identified. IMPRESSION: Positive for acute appendicitis, with tiny periappendiceal abscess. Colonic diverticulosis, without radiographic evidence of diverticulitis. Aortic Atherosclerosis (ICD10-I70.0). These results will be called to the ordering clinician or representative by the Radiologist Assistant, and communication documented in the PACS or [employer]. Electronically Signed   By: Marlaine Hind M.D.   On: 07/13/2020 14:10     EKG: Independently reviewed, with result as described above.    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 male with medical history significant for permanent atrial fibrillation chronically anticoagulated on Eliquis, complete heart block status  post pacemaker, hypertension, hyperlipidemia, who is admitted to [facility] on 07/13/2020 with acute appendicitis after presenting from home to [facility] ED complaining of abdominal pain.    Principal Problem:   Acute appendicitis Active Problems:   Hyperlipidemia   Atrial fibrillation, permanent (HCC)   Essential hypertension   Abdominal pain   Hypokalemia   AKI (acute kidney injury) ([place])      #) Acute appendicitis: Diagnosis on the basis of 3 days of progressive right lower quadrant abdominal discomfort, anorexia, leukocytosis with left shift, and CT abdomen/pelvis suggestive  of acute appendicitis with small periappendiceal abscess. Patient's case/imaging were discussed with the on-call general surgeon, Dr.Ramirez, Who recommended admission to the hospital service with plan for general surgery consultation. Dr. Rosendo Gros recommends conservative measures overnight, including IV antibiotics, and tentatively plans for appendectomy on either the afternoon of 07/14/2020 or on 07/15/2020.  Of note, aside from leukocytosis, no additional SIRS criteria met at this time, and therefore criteria for sepsis not met at the present.  No additional evidence of underlying infectious process, including negative COVID-19 PCR performed in the ED today.  Of note, urinalysis has been ordered, result currently pending.  In the ED this evening, the patient received cefepime as well as Flagyl.  Consider de-escalating his fourth-generation cephalosporin to Rocephin, however, the patient has documented history of allergy to penicillin, with increased risk for crossover reactivity with a third generation cephalosporin relative to a fourth-generation.  Consequently, we will continue existing cefepime and continue Flagyl, with the latter for inclusion of anaerobic coverage.  Of note, these antibiotics were administered prior to collection of blood cultures, thereby limiting the utility of subsequent collection of such.  No  evidence to suggest acute MI or acutely decompensated heart failure at this time.  Therefore, no absolute contraindications to proceeding with proposed surgical intervention at this time.    Plan: Lactated Ringer's at 75 cc/h.  Continue cefepime and Flagyl, as above.  Repeat CBC with differential in the morning.  General surgery consulted.  NPO.  As needed IV fentanyl.  As needed Zofran.  Repeat CMP in the morning.  INR in the morning.      #) Hypokalemia: Presenting serum potassium mildly low at 3.3.  This appears to be in the context of diminished oral intake over the last 3 days on the basis of apparent anorexia associated with presenting acute appendicitis.  Of note, the patient is on daily oral potassium supplementation as an outpatient in the context of home chlorthalidone.  In the setting of concomitant presenting acute kidney injury, will be slightly more conservative in supplementation of serum potassium so as to prevent hyperkalemia.  Plan: In the setting of current n.p.o. status, will order potassium chloride 30 mill equivalents IV over 3 hours x 1 dose now.  Add on serum magnesium level.  Repeat BMP in the morning.  Monitor on telemetry.       #) Acute kidney injury: Presenting serum creatinine noted to be 1.67, which is relative to apparent baseline of 1.0-1.1, with most recent pressure creatinine data point noted to be 1.09 in November 2018.  This appears to be prerenal in nature in the setting of dehydration due to intravascular depletion as a consequence of recent decline in oral intake as a consequence of anorexia relating to presenting acute appendicitis, as further detailed above.  There may also be a pharmacologic exacerbation in the setting of home olmesartan as well as chlorthalidone.  Plan: Check urinalysis with microscopy.  Add on random urine sodium as well as random urine creatinine.  Monitor strict I's and O's Daily weights.  Attempt to avoid nephrotoxic agents.  Hold  home olmesartan and chlorthalidone for now.  Lactated Ringer's at 75 cc/h.  Repeat BMP in the morning.  Further evaluation management of acute appendicitis, as above.        #) Permanent atrial fibrillation: Documented history of such. In the setting of a CHA2DS2-VASc score of 3, there is an indication for the patient to be on chronic anticoagulation for thromboembolic prophylaxis. Consistent with this, the  patient is chronically anticoagulated on Eliquis, with most recent dose taken on the morning of 07/13/2020. Home AV nodal blocking regimen: Nebivolol.     Plan: monitor strict I's & O's and daily weights. Repeat BMP and CBC in the morning. Check serum magnesium level.  In the setting of presenting acute appendicitis with impending plan for surgical intervention as further described above, will hold home Eliquis for now.  Additionally, in this context, will hold home beta-blocker for now, with plan for postoperative resumption, including for associated mortality benefit in the context of postop resumption of beta-blocker.  Monitor on telemetry.      #) Essential hypertension: Outpatient antihypertensive regimen includes chlorthalidone, Norvasc, nebivolol, and olmesartan.  In the context of presenting acute appendicitis, systolic blood pressures have been in the 90s to low 100s thus far.  Consequently, will hold home antihypertensive medications for now, including olmesartan and chlorthalidone empirically in the setting of concomitant presenting acute kidney injury.  Plan: Hold home and hypertensive medications for now, as above.  Close monitoring of ensuing blood pressure via routine vital signs.  Further evaluation management of presenting acute appendicitis, as further described above.      #) Hyperlipidemia: On Zetia as an outpatient.  Plan: In the setting of current n.p.o. status, hold home Zetia for now.     DVT prophylaxis: scd's  Code Status: Full code Family Communication:  Case was discussed with the patient's wife, who is present at bedside Disposition Plan: Per Rounding Team Consults called: on-call general surgery (Dr. Rosendo Gros, as further detailed above);   Admission status: Inpatient;     Of note, this patient was added by me to the following Admit List/Treatment Team: [email or web address].      PLEASE NOTE THAT DRAGON DICTATION SOFTWARE WAS USED IN THE CONSTRUCTION OF THIS NOTE.   [facility] Pager [phone] From [place]  Otherwise, please contact night-coverage  [email or web address] Password [email or web address]   07/13/2020, 8:00 PM

## 2020-07-13 NOTE — Progress Notes (Signed)
Brief note regarding preliminary plan, with full H&P to follow:  87 year old male with history of permanent atrial fibrillation chronically anticoagulated on Eliquis who is admitted with acute appendicitis and potential associated abscess and presenting with 3 days of right lower quadrant abdominal pain, with outpatient CT abdomen/pelvis ordered by PCP suggestive of the above.  Has leukocytosis, but otherwise no additional SIRS criteria at this time.  Dr. Lubertha South of General surgery consulted and will follow, requesting the patient be admitted to hospitalist service in the setting of the patient's multiple chronic comorbidities. Surgery anticipated either tomorrow afternoon (07/14/20) or 07/15/20.  will continue the broad-spectrum antibiotics that were initiated in the ED, namely cefepime and Flagyl. NPO. Will follow for results of urinalysis.  Providing IV supplementation of mild hypokalemia.  Added on serum magnesium level.    Babs Bertin, DO Hospitalist

## 2020-07-13 NOTE — Consult Note (Signed)
Reason for Consult: Acute appendicitis Referring Physician: Dr. Charma Igo, [name] is an 87 y.o. male.  HPI: Patient is an 87 year old male, who comes in secondary to right-sided abdominal pain.  Patient has a history of complete heart block, pacemaker, A. fib-on Eliquis.  Patient states he last took his Eliquis yesterday morning. Patient comes in with a 1 day history of abdominal pain that was mainly right-sided.  He had no nausea or vomiting but did have some chills according to his wife.  Patient had an outside CT scan which was found to have signs consistent with acute appendicitis and a small tip abscess.  I did review the CT scan personally.  Patient also with a leukocytosis of 18.5.  General surgery was consulted for further evaluation and management.  Past Medical History:  Diagnosis Date   Arthritis    Atrial fibrillation ([place])    Dysrhythmia    AF   Hyperlipemia    Hypertension    Memory difficulty 12/16/2017   Pacemaker    Pacemaker    Sinus bradycardia seen on cardiac monitor    Sleep apnea    does use his cpap   Wears glasses    Wears hearing aid     Past Surgical History:  Procedure Laterality Date   CARDIOVERSION     multiple times-pacer put in 2009   COLONOSCOPY     I & D EXTREMITY Left 04/20/2013   Procedure: LEFT HAND WOUND DEBRIDEMENT AND CLOSURE ;  Surgeon: Jolyn Nap, MD;  Location: [facility];  Service: Orthopedics;  Laterality: Left;   KNEE ARTHROSCOPY  2/10   left   MASS EXCISION  01/29/2012   Procedure: EXCISION MASS;  Surgeon: Hessie Dibble, MD;  Location: [place];  Service: Orthopedics;  Laterality: Left;  excision of bone spur left hand   METACARPAL OSTEOTOMY Left 01/13/2013   Procedure: LEFT INDEX FINGER MCP RADIAL COLLATERAL REPAIR/RECONSTRUCTION VS METACARPAL ROTATIONAL OSTEOTOMY;  Surgeon: Jolyn Nap, MD;  Location: [facility];  Service: Orthopedics;  Laterality: Left;    PACEMAKER INSERTION  2009   PPM GENERATOR CHANGEOUT N/A 09/26/2016   Procedure: PPM Generator Changeout;  Surgeon: Sanda Klein, MD;  Location: [facility];  Service: Cardiovascular;  Laterality: N/A;   SHOULDER ARTHROSCOPY  2004   left   THUMB ARTHROSCOPY  2007   rt thunb mass   TONSILLECTOMY      Family History  Problem Relation Age of Onset   Dementia Mother    Stroke Father    Hypertension Brother    Diabetes Brother    Diabetes Maternal Grandmother    Stroke Paternal Grandfather    Emphysema Other    Heart disease Other     Social History:  reports that he has never smoked. He has never used smokeless tobacco. He reports current alcohol use. He reports that he does not use drugs.  Allergies:  Allergies  Allergen Reactions   Lipitor [Atorvastatin] Other (See Comments)    Muscle pain   Penicillins Rash    Has patient had a PCN reaction causing immediate rash, facial/tongue/throat swelling, SOB or lightheadedness with hypotension: Unknown Has patient had a PCN reaction causing severe rash involving mucus membranes or skin necrosis: Unknown Has patient had a PCN reaction that required hospitalization: No Has patient had a PCN reaction occurring within the last 10 years: No If all of the above answers are "NO", then may proceed with Cephalosporin  use.    Vytorin [Ezetimibe-Simvastatin] Other (See Comments)    Muscle pain    Medications: I have reviewed the patient's current medications.  Results for orders placed or performed during the hospital encounter of 07/13/20 (from the past 48 hour(s))  CBC with Differential     Status: Abnormal   Collection Time: 07/13/20  5:02 PM  Result Value Ref Range   WBC 18.5 (H) 4.0 - 10.5 K/uL   RBC 4.34 4.22 - 5.81 MIL/uL   Hemoglobin 13.3 13.0 - 17.0 g/dL   HCT 39.9 39.0 - 52.0 %   MCV 91.9 80.0 - 100.0 fL   MCH 30.6 26.0 - 34.0 pg   MCHC 33.3 30.0 - 36.0 g/dL   RDW 13.8 11.5 - 15.5 %   Platelets 185 150 - 400 K/uL    nRBC 0.0 0.0 - 0.2 %   Neutrophils Relative % 82 %   Neutro Abs 15.3 (H) 1.7 - 7.7 K/uL   Lymphocytes Relative 8 %   Lymphs Abs 1.5 0.7 - 4.0 K/uL   Monocytes Relative 9 %   Monocytes Absolute 1.6 (H) 0.1 - 1.0 K/uL   Eosinophils Relative 0 %   Eosinophils Absolute 0.0 0.0 - 0.5 K/uL   Basophils Relative 0 %   Basophils Absolute 0.1 0.0 - 0.1 K/uL   Immature Granulocytes 1 %   Abs Immature Granulocytes 0.11 (H) 0.00 - 0.07 K/uL    Comment: Performed at [facility], [address], [place], [place] [address]  Comprehensive metabolic panel     Status: Abnormal   Collection Time: 07/13/20  5:02 PM  Result Value Ref Range   Sodium 135 135 - 145 mmol/L   Potassium 3.3 (L) 3.5 - 5.1 mmol/L   Chloride 100 98 - 111 mmol/L   CO2 25 22 - 32 mmol/L   Glucose, Bld 98 70 - 99 mg/dL    Comment: Glucose reference range applies only to samples taken after fasting for at least 8 hours.   BUN 39 (H) 8 - 23 mg/dL   Creatinine, Ser 1.67 (H) 0.61 - 1.24 mg/dL   Calcium 9.1 8.9 - 10.3 mg/dL   Total Protein 7.2 6.5 - 8.1 g/dL   Albumin 3.3 (L) 3.5 - 5.0 g/dL   AST 18 15 - 41 U/L   ALT 11 0 - 44 U/L   Alkaline Phosphatase 56 38 - 126 U/L   Total Bilirubin 1.4 (H) 0.3 - 1.2 mg/dL   GFR, Estimated 40 (L) >60 mL/min    Comment: (NOTE) Calculated using the CKD-EPI Creatinine Equation (2021)    Anion gap 10 5 - 15    Comment: Performed at [facility] [address] [place], [place] [address]  Lipase, blood     Status: None   Collection Time: 07/13/20  5:02 PM  Result Value Ref Range   Lipase 43 11 - 51 U/L    Comment: Performed at [facility] [address] [place], [place] [address]  Protime-INR     Status: Abnormal   Collection Time: 07/13/20  5:02 PM  Result Value Ref Range   Prothrombin Time 18.1 (H) 11.4 - 15.2 seconds   INR 1.5 (H) 0.8 - 1.2    Comment: (NOTE) INR goal varies based on device and disease states. Performed at [facility], [place] [address]  [place], [place] [address]   APTT     Status: None   Collection Time: 07/13/20  5:02  PM  Result Value Ref Range   aPTT 36 24 - 36 seconds    Comment: Performed at [facility], [place] [address], [place], [place] [address]    CT ABDOMEN PELVIS W CONTRAST  Result Date: 07/13/2020 CLINICAL DATA:  Right lower quadrant pain for 3 days. EXAM: CT ABDOMEN AND PELVIS WITH CONTRAST TECHNIQUE: Multidetector CT imaging of the abdomen and pelvis was performed using the standard protocol following bolus administration of intravenous contrast. CONTRAST:  27mL ISOVUE-300 IOPAMIDOL (ISOVUE-300) INJECTION 61% COMPARISON:  None. FINDINGS: Lower Chest: No acute findings. Hepatobiliary: No hepatic masses identified. Gallbladder is unremarkable. No evidence of biliary ductal dilatation. Pancreas:  No mass or inflammatory changes. Spleen: Within normal limits in size and appearance. Adrenals/Urinary Tract: No masses identified. Mild bilateral renal parenchymal scarring noted. No evidence of ureteral calculi or hydronephrosis. Stomach/Bowel: Findings of acute appendicitis with moderate periappendiceal inflammatory changes are noted. Details as follows: Appendix: Location- Retrocecal Diameter-10 mm Appendicolith- Absent Mucosal hyper-enhancement- Present Extraluminal Gas- Absent Periappendiceal Collection-tiny fluid collection is seen along the posterior aspect of the appendix measuring 2.2 x 0.9 cm, suspicious for abscess. Diffuse colonic diverticulosis is seen with greatest involvement of the sigmoid colon. No evidence of diverticulitis. Vascular/Lymphatic: No pathologically enlarged lymph nodes. No acute vascular findings. Aortic atherosclerotic calcification noted. Reproductive:  No mass or other significant abnormality. Other:  None. Musculoskeletal:  No suspicious bone lesions identified. IMPRESSION: Positive for acute appendicitis, with tiny periappendiceal abscess. Colonic diverticulosis, without radiographic evidence of  diverticulitis. Aortic Atherosclerosis (ICD10-I70.0). These results will be called to the ordering clinician or representative by the Radiologist Assistant, and communication documented in the PACS or [employer]. Electronically Signed   By: Marlaine Hind M.D.   On: 07/13/2020 14:10    Review of Systems  Constitutional:  Positive for chills. Negative for fever.  HENT:  Negative for ear discharge, hearing loss and sore throat.   Eyes:  Negative for discharge.  Respiratory:  Negative for cough and shortness of breath.   Cardiovascular:  Negative for chest pain and leg swelling.  Gastrointestinal:  Positive for abdominal pain. Negative for constipation, diarrhea, nausea and vomiting.  Musculoskeletal:  Negative for myalgias and neck pain.  Skin:  Negative for rash.  Allergic/Immunologic: Negative for environmental allergies.  Neurological:  Negative for dizziness and seizures.  Hematological:  Does not bruise/bleed easily.  Psychiatric/Behavioral:  Negative for suicidal ideas.   All other systems reviewed and are negative. Blood pressure (!) 111/54, pulse (!) 57, temperature 98.8 F (37.1 C), temperature source Oral, resp. rate 15, height 5\' 11"  (1.803 m), weight 81.6 kg, SpO2 99 %. Physical Exam Constitutional:      Appearance: He is well-developed.     Comments: Conversant No acute distress  Eyes:     General: Lids are normal. No scleral icterus.    Comments: Pupils are equal round and reactive No lid lag Moist conjunctiva  Neck:     Thyroid: No thyromegaly.     Trachea: No tracheal tenderness.     Comments: No cervical lymphadenopathy Cardiovascular:     Rate and Rhythm: Normal rate and regular rhythm.     Heart sounds: No murmur heard. Pulmonary:     Effort: Pulmonary effort is normal.     Breath sounds: Normal breath sounds. No wheezing or rales.  Abdominal:     Tenderness: There is abdominal tenderness.     Hernia: No hernia is present.    Skin:    General: Skin is  warm.     Findings: No rash.     Nails: There is no clubbing.     Comments: Normal skin turgor  Neurological:     Mental Status: He is alert and oriented to person, place, and time.     Comments: Normal gait and station  Psychiatric:        Judgment: Judgment normal.     Comments: Appropriate affect    Assessment/Plan: A 87 year old male with acute appendicitis Complete heart block A. fib-on Eliquis  1.  Would recommend IV antibiotics.  Recommend medical admission. 2.  Patient will require cardiac evaluation prior to scheduling surgery.  If patient is felt to be low risk patient will likely undergo lap appendectomy tomorrow.  This will be 48 hours out from his last Eliquis administration. 3.  We will discuss case with Dr. Kae Heller tomorrow for possible lap appendectomy.  Ralene Ok 07/13/2020, 7:56 PM

## 2020-07-13 NOTE — ED Triage Notes (Addendum)
Pt here with a positive appendicitis from Ct scan today , no n/v pt is on a blood thinner but did not take it today

## 2020-07-14 ENCOUNTER — Inpatient Hospital Stay (HOSPITAL_COMMUNITY): Payer: Medicare Other | Admitting: Anesthesiology

## 2020-07-14 ENCOUNTER — Encounter (HOSPITAL_COMMUNITY): Payer: Self-pay | Admitting: Internal Medicine

## 2020-07-14 ENCOUNTER — Encounter (HOSPITAL_COMMUNITY)
Admission: EM | Disposition: A | Discharge status: Home / Self Care | Payer: Self-pay | Source: Home / Self Care | Attending: Internal Medicine

## 2020-07-14 DIAGNOSIS — N179 Acute kidney failure, unspecified: Secondary | ICD-10-CM | POA: Diagnosis present

## 2020-07-14 DIAGNOSIS — R109 Unspecified abdominal pain: Secondary | ICD-10-CM | POA: Diagnosis present

## 2020-07-14 DIAGNOSIS — E876 Hypokalemia: Secondary | ICD-10-CM | POA: Diagnosis present

## 2020-07-14 HISTORY — PX: LAPAROSCOPIC APPENDECTOMY: SHX408

## 2020-07-14 LAB — URINALYSIS, COMPLETE (UACMP) WITH MICROSCOPIC
Bacteria, UA: NONE SEEN
Bilirubin Urine: NEGATIVE
Glucose, UA: NEGATIVE mg/dL
Hgb urine dipstick: NEGATIVE
Ketones, ur: 5 mg/dL — AB
Leukocytes,Ua: NEGATIVE
Nitrite: NEGATIVE
Protein, ur: NEGATIVE mg/dL
Specific Gravity, Urine: 1.039 — ABNORMAL HIGH (ref 1.005–1.030)
pH: 5 (ref 5.0–8.0)

## 2020-07-14 LAB — CBC WITH DIFFERENTIAL/PLATELET
Abs Immature Granulocytes: 0.09 10*3/uL — ABNORMAL HIGH (ref 0.00–0.07)
Basophils Absolute: 0.1 10*3/uL (ref 0.0–0.1)
Basophils Relative: 0 %
Eosinophils Absolute: 0.1 10*3/uL (ref 0.0–0.5)
Eosinophils Relative: 0 %
HCT: 38.4 % — ABNORMAL LOW (ref 39.0–52.0)
Hemoglobin: 13 g/dL (ref 13.0–17.0)
Immature Granulocytes: 1 %
Lymphocytes Relative: 7 %
Lymphs Abs: 1.2 10*3/uL (ref 0.7–4.0)
MCH: 30.8 pg (ref 26.0–34.0)
MCHC: 33.9 g/dL (ref 30.0–36.0)
MCV: 91 fL (ref 80.0–100.0)
Monocytes Absolute: 1.2 10*3/uL — ABNORMAL HIGH (ref 0.1–1.0)
Monocytes Relative: 8 %
Neutro Abs: 13.3 10*3/uL — ABNORMAL HIGH (ref 1.7–7.7)
Neutrophils Relative %: 84 %
Platelets: 166 10*3/uL (ref 150–400)
RBC: 4.22 MIL/uL (ref 4.22–5.81)
RDW: 13.9 % (ref 11.5–15.5)
WBC: 15.9 10*3/uL — ABNORMAL HIGH (ref 4.0–10.5)
nRBC: 0 % (ref 0.0–0.2)

## 2020-07-14 LAB — COMPREHENSIVE METABOLIC PANEL
ALT: 12 U/L (ref 0–44)
AST: 16 U/L (ref 15–41)
Albumin: 3 g/dL — ABNORMAL LOW (ref 3.5–5.0)
Alkaline Phosphatase: 47 U/L (ref 38–126)
Anion gap: 8 (ref 5–15)
BUN: 36 mg/dL — ABNORMAL HIGH (ref 8–23)
CO2: 22 mmol/L (ref 22–32)
Calcium: 8.8 mg/dL — ABNORMAL LOW (ref 8.9–10.3)
Chloride: 106 mmol/L (ref 98–111)
Creatinine, Ser: 1.45 mg/dL — ABNORMAL HIGH (ref 0.61–1.24)
GFR, Estimated: 47 mL/min — ABNORMAL LOW (ref >60)
Glucose, Bld: 101 mg/dL — ABNORMAL HIGH (ref 70–99)
Potassium: 3.6 mmol/L (ref 3.5–5.1)
Sodium: 136 mmol/L (ref 135–145)
Total Bilirubin: 1.6 mg/dL — ABNORMAL HIGH (ref 0.3–1.2)
Total Protein: 6.5 g/dL (ref 6.5–8.1)

## 2020-07-14 LAB — MAGNESIUM: Magnesium: 1.9 mg/dL (ref 1.7–2.4)

## 2020-07-14 LAB — SODIUM, URINE, RANDOM: Sodium, Ur: 42 mmol/L

## 2020-07-14 LAB — PROTIME-INR
INR: 1.5 — ABNORMAL HIGH (ref 0.8–1.2)
Prothrombin Time: 17.9 seconds — ABNORMAL HIGH (ref 11.4–15.2)

## 2020-07-14 LAB — CREATININE, URINE, RANDOM: Creatinine, Urine: 138.42 mg/dL

## 2020-07-14 SURGERY — APPENDECTOMY, LAPAROSCOPIC
Anesthesia: General

## 2020-07-14 MED ORDER — ROCURONIUM BROMIDE 10 MG/ML (PF) SYRINGE
PREFILLED_SYRINGE | INTRAVENOUS | Status: DC | PRN
  Administered 2020-07-14: 70 mg via INTRAVENOUS

## 2020-07-14 MED ORDER — SODIUM CHLORIDE 0.9 % IV SOLN
2.0000 g | Freq: Two times a day (BID) | INTRAVENOUS | Status: DC
  Administered 2020-07-14 – 2020-07-15 (×2): 2 g via INTRAVENOUS
  Filled 2020-07-14 (×2): qty 2

## 2020-07-14 MED ORDER — FENTANYL CITRATE (PF) 100 MCG/2ML IJ SOLN
INTRAMUSCULAR | Status: AC
  Filled 2020-07-14: qty 2

## 2020-07-14 MED ORDER — HEMOSTATIC AGENTS (NO CHARGE) OPTIME
TOPICAL | Status: DC | PRN
  Administered 2020-07-14: 1 via TOPICAL

## 2020-07-14 MED ORDER — PHENYLEPHRINE HCL-NACL 10-0.9 MG/250ML-% IV SOLN
INTRAVENOUS | Status: DC | PRN
  Administered 2020-07-14: 80 ug/min via INTRAVENOUS

## 2020-07-14 MED ORDER — DEXAMETHASONE SODIUM PHOSPHATE 10 MG/ML IJ SOLN
INTRAMUSCULAR | Status: DC | PRN
  Administered 2020-07-14: 5 mg via INTRAVENOUS

## 2020-07-14 MED ORDER — BUPIVACAINE-EPINEPHRINE 0.25% -1:200000 IJ SOLN
INTRAMUSCULAR | Status: DC | PRN
  Administered 2020-07-14: 6 mL

## 2020-07-14 MED ORDER — LIDOCAINE 2% (20 MG/ML) 5 ML SYRINGE
INTRAMUSCULAR | Status: DC | PRN
  Administered 2020-07-14: 100 mg via INTRAVENOUS

## 2020-07-14 MED ORDER — PROPOFOL 10 MG/ML IV BOLUS
INTRAVENOUS | Status: DC | PRN
  Administered 2020-07-14: 100 mg via INTRAVENOUS

## 2020-07-14 MED ORDER — EPHEDRINE SULFATE-NACL 50-0.9 MG/10ML-% IV SOSY
PREFILLED_SYRINGE | INTRAVENOUS | Status: DC | PRN
  Administered 2020-07-14 (×2): 10 mg via INTRAVENOUS

## 2020-07-14 MED ORDER — CELECOXIB 200 MG PO CAPS
200.0000 mg | ORAL_CAPSULE | Freq: Once | ORAL | Status: AC
  Administered 2020-07-14: 200 mg via ORAL
  Filled 2020-07-14: qty 1

## 2020-07-14 MED ORDER — ROCURONIUM BROMIDE 10 MG/ML (PF) SYRINGE
PREFILLED_SYRINGE | INTRAVENOUS | Status: AC
  Filled 2020-07-14: qty 10

## 2020-07-14 MED ORDER — AMISULPRIDE (ANTIEMETIC) 5 MG/2ML IV SOLN
10.0000 mg | Freq: Once | INTRAVENOUS | Status: DC | PRN
Start: 2020-07-14 — End: 2020-07-14

## 2020-07-14 MED ORDER — BUPIVACAINE-EPINEPHRINE (PF) 0.25% -1:200000 IJ SOLN
INTRAMUSCULAR | Status: AC
  Filled 2020-07-14: qty 30

## 2020-07-14 MED ORDER — EPHEDRINE 5 MG/ML INJ
INTRAVENOUS | Status: AC
  Filled 2020-07-14: qty 10

## 2020-07-14 MED ORDER — FENTANYL CITRATE (PF) 250 MCG/5ML IJ SOLN
INTRAMUSCULAR | Status: DC | PRN
  Administered 2020-07-14: 100 ug via INTRAVENOUS

## 2020-07-14 MED ORDER — OXYCODONE HCL 5 MG PO TABS
5.0000 mg | ORAL_TABLET | ORAL | Status: DC | PRN
Start: 2020-07-14 — End: 2020-07-15
  Administered 2020-07-14: 5 mg via ORAL
  Filled 2020-07-14: qty 1

## 2020-07-14 MED ORDER — ORAL CARE MOUTH RINSE: 15.0000 mL | Freq: Once | OROMUCOSAL | Status: AC

## 2020-07-14 MED ORDER — LIDOCAINE 2% (20 MG/ML) 5 ML SYRINGE
INTRAMUSCULAR | Status: AC
  Filled 2020-07-14: qty 5

## 2020-07-14 MED ORDER — SUGAMMADEX SODIUM 200 MG/2ML IV SOLN
INTRAVENOUS | Status: DC | PRN
  Administered 2020-07-14: 100 mg via INTRAVENOUS
  Administered 2020-07-14: 150 mg via INTRAVENOUS

## 2020-07-14 MED ORDER — FENTANYL CITRATE (PF) 100 MCG/2ML IJ SOLN
25.0000 ug | INTRAMUSCULAR | Status: DC | PRN
  Administered 2020-07-14: 25 ug via INTRAVENOUS

## 2020-07-14 MED ORDER — NEBIVOLOL HCL 10 MG PO TABS
20.0000 mg | ORAL_TABLET | Freq: Every day | ORAL | Status: DC
  Administered 2020-07-15: 20 mg via ORAL
  Filled 2020-07-14 (×2): qty 2

## 2020-07-14 MED ORDER — LACTATED RINGERS IV SOLN: INTRAVENOUS | Status: DC

## 2020-07-14 MED ORDER — FENTANYL CITRATE (PF) 250 MCG/5ML IJ SOLN
INTRAMUSCULAR | Status: AC
  Filled 2020-07-14: qty 5

## 2020-07-14 MED ORDER — 0.9 % SODIUM CHLORIDE (POUR BTL) OPTIME
TOPICAL | Status: DC | PRN
  Administered 2020-07-14: 1000 mL

## 2020-07-14 MED ORDER — CHLORHEXIDINE GLUCONATE 0.12 % MT SOLN: 15.0000 mL | Freq: Once | OROMUCOSAL | Status: AC

## 2020-07-14 MED ORDER — ACETAMINOPHEN 500 MG PO TABS
1000.0000 mg | ORAL_TABLET | Freq: Once | ORAL | Status: AC
  Administered 2020-07-14: 1000 mg via ORAL
  Filled 2020-07-14: qty 2

## 2020-07-14 MED ORDER — SUGAMMADEX SODIUM 500 MG/5ML IV SOLN
INTRAVENOUS | Status: AC
  Filled 2020-07-14: qty 5

## 2020-07-14 MED ORDER — CHLORHEXIDINE GLUCONATE 0.12 % MT SOLN
OROMUCOSAL | Status: AC
  Administered 2020-07-14: 15 mL via OROMUCOSAL
  Filled 2020-07-14: qty 15

## 2020-07-14 MED ORDER — ONDANSETRON HCL 4 MG/2ML IJ SOLN
INTRAMUSCULAR | Status: DC | PRN
  Administered 2020-07-14: 4 mg via INTRAVENOUS

## 2020-07-14 MED ORDER — DEXAMETHASONE SODIUM PHOSPHATE 10 MG/ML IJ SOLN
INTRAMUSCULAR | Status: AC
  Filled 2020-07-14: qty 1

## 2020-07-14 MED ORDER — PROMETHAZINE HCL 25 MG/ML IJ SOLN: 6.2500 mg | INTRAMUSCULAR | Status: DC | PRN

## 2020-07-14 MED ORDER — SODIUM CHLORIDE 0.9 % IR SOLN
Status: DC | PRN
  Administered 2020-07-14: 1000 mL

## 2020-07-14 MED ORDER — ARTIFICIAL TEARS OPHTHALMIC OINT
TOPICAL_OINTMENT | OPHTHALMIC | Status: AC
  Filled 2020-07-14: qty 3.5

## 2020-07-14 SURGICAL SUPPLY — 51 items
ADH SKN CLS APL DERMABOND .7 (GAUZE/BANDAGES/DRESSINGS) ×1
APL PRP STRL LF DISP 70% ISPRP (MISCELLANEOUS) ×1
APPLIER CLIP 5 13 M/L LIGAMAX5 (MISCELLANEOUS)
APR CLP MED LRG 5 ANG JAW (MISCELLANEOUS)
BAG COUNTER SPONGE SURGICOUNT (BAG) ×2 IMPLANT
BAG SPEC RTRVL LRG 6X4 10 (ENDOMECHANICALS) ×1
BAG SPNG CNTER NS LX DISP (BAG) ×1
BLADE CLIPPER SURG (BLADE) IMPLANT
CANISTER SUCT 3000ML PPV (MISCELLANEOUS) ×2 IMPLANT
CHLORAPREP W/TINT 26 (MISCELLANEOUS) ×2 IMPLANT
CLIP APPLIE 5 13 M/L LIGAMAX5 (MISCELLANEOUS) IMPLANT
COVER SURGICAL LIGHT HANDLE (MISCELLANEOUS) ×2 IMPLANT
CUTTER FLEX LINEAR 45M (STAPLE) ×2 IMPLANT
DERMABOND ADVANCED (GAUZE/BANDAGES/DRESSINGS) ×1
DERMABOND ADVANCED .7 DNX12 (GAUZE/BANDAGES/DRESSINGS) ×1 IMPLANT
ELECT REM PT RETURN 9FT ADLT (ELECTROSURGICAL) ×2
ELECTRODE REM PT RTRN 9FT ADLT (ELECTROSURGICAL) ×1 IMPLANT
GLOVE SURG ENC MOIS LTX SZ6 (GLOVE) ×2 IMPLANT
GLOVE SURG POLYISO LF SZ6 (GLOVE) ×1 IMPLANT
GLOVE SURG UNDER LTX SZ6.5 (GLOVE) ×2 IMPLANT
GLOVE SURG UNDER POLY LF SZ6.5 (GLOVE) ×1 IMPLANT
GLOVE SURG UNDER POLY LF SZ7 (GLOVE) ×1 IMPLANT
GOWN STRL REUS W/ TWL LRG LVL3 (GOWN DISPOSABLE) ×3 IMPLANT
GOWN STRL REUS W/TWL LRG LVL3 (GOWN DISPOSABLE) ×8
GRASPER SUT TROCAR 14GX15 (MISCELLANEOUS) ×2 IMPLANT
HEMOSTAT SNOW SURGICEL 2X4 (HEMOSTASIS) ×1 IMPLANT
KIT BASIN OR (CUSTOM PROCEDURE TRAY) ×2 IMPLANT
KIT TURNOVER KIT B (KITS) ×2 IMPLANT
NDL INSUFFLATION 14GA 120MM (NEEDLE) ×1 IMPLANT
NEEDLE INSUFFLATION 14GA 120MM (NEEDLE) ×2 IMPLANT
NS IRRIG 1000ML POUR BTL (IV SOLUTION) ×2 IMPLANT
PAD ARMBOARD 7.5X6 YLW CONV (MISCELLANEOUS) ×4 IMPLANT
POUCH SPECIMEN RETRIEVAL 10MM (ENDOMECHANICALS) ×2 IMPLANT
RELOAD 45 VASCULAR/THIN (ENDOMECHANICALS) IMPLANT
RELOAD STAPLE 45 2.5 WHT GRN (ENDOMECHANICALS) IMPLANT
RELOAD STAPLE 45 3.5 BLU ETS (ENDOMECHANICALS) IMPLANT
RELOAD STAPLE TA45 3.5 REG BLU (ENDOMECHANICALS) ×2 IMPLANT
SCISSORS LAP 5X35 DISP (ENDOMECHANICALS) IMPLANT
SET IRRIG TUBING LAPAROSCOPIC (IRRIGATION / IRRIGATOR) ×2 IMPLANT
SET TUBE SMOKE EVAC HIGH FLOW (TUBING) ×2 IMPLANT
SHEARS HARMONIC ACE PLUS 36CM (ENDOMECHANICALS) ×2 IMPLANT
SLEEVE ENDOPATH XCEL 5M (ENDOMECHANICALS) ×2 IMPLANT
SPECIMEN JAR SMALL (MISCELLANEOUS) ×2 IMPLANT
SUT MNCRL AB 4-0 PS2 18 (SUTURE) ×2 IMPLANT
TOWEL GREEN STERILE FF (TOWEL DISPOSABLE) ×2 IMPLANT
TRAY FOLEY W/BAG SLVR 16FR (SET/KITS/TRAYS/PACK) ×2
TRAY FOLEY W/BAG SLVR 16FR ST (SET/KITS/TRAYS/PACK) ×1 IMPLANT
TRAY LAPAROSCOPIC MC (CUSTOM PROCEDURE TRAY) ×2 IMPLANT
TROCAR XCEL 12X100 BLDLESS (ENDOMECHANICALS) ×2 IMPLANT
TROCAR XCEL NON-BLD 5MMX100MML (ENDOMECHANICALS) ×2 IMPLANT
WATER STERILE IRR 1000ML POUR (IV SOLUTION) ×2 IMPLANT

## 2020-07-14 NOTE — ED Notes (Signed)
The pt has no complaints at present

## 2020-07-14 NOTE — ED Notes (Signed)
Dr Kae Heller at the bedside

## 2020-07-14 NOTE — Anesthesia Procedure Notes (Signed)
Procedure Name: Intubation Date/Time: 07/14/2020 11:39 AM Performed by: Kathryne Hitch, CRNA Pre-anesthesia Checklist: Patient identified, Emergency Drugs available, Suction available and Patient being monitored Patient Re-evaluated:Patient Re-evaluated prior to induction Oxygen Delivery Method: Circle system utilized Preoxygenation: Pre-oxygenation with 100% oxygen Induction Type: IV induction Ventilation: Mask ventilation without difficulty Laryngoscope Size: Mac and 4 Grade View: Grade II Tube type: Oral Number of attempts: 1 Airway Equipment and Method: Stylet and Oral airway Placement Confirmation: ETT inserted through vocal cords under direct vision, positive ETCO2 and breath sounds checked- equal and bilateral Secured at: 24 cm Tube secured with: Tape Dental Injury: Teeth and Oropharynx as per pre-operative assessment  Comments: Performed by Heide Scales, SRNA under direct supervision by Dr. Tobias Alexander and Fulton Reek, CRNA

## 2020-07-14 NOTE — Anesthesia Preprocedure Evaluation (Addendum)
Anesthesia Evaluation  Patient identified by MRN, date of birth, ID band Patient awake    Reviewed: Allergy & Precautions, NPO status , Patient's Chart, lab work & pertinent test results  History of Anesthesia Complications Negative for: history of anesthetic complications  Airway Mallampati: II  TM Distance: >3 FB Neck ROM: Full    Dental  (+) Dental Advisory Given   Pulmonary sleep apnea and Continuous Positive Airway Pressure Ventilation ,    Pulmonary exam normal        Cardiovascular hypertension, Pt. on medications and Pt. on home beta blockers Normal cardiovascular exam+ dysrhythmias Atrial Fibrillation + pacemaker      Neuro/Psych negative neurological ROS     GI/Hepatic negative GI ROS, Neg liver ROS,   Endo/Other  negative endocrine ROS  Renal/GU negative Renal ROS     Musculoskeletal negative musculoskeletal ROS (+)   Abdominal   Peds  Hematology negative hematology ROS (+)   Anesthesia Other Findings   Reproductive/Obstetrics                            Anesthesia Physical Anesthesia Plan  ASA: 3 and emergent  Anesthesia Plan: General   Post-op Pain Management:    Induction: Intravenous, Rapid sequence and Cricoid pressure planned  PONV Risk Score and Plan: 4 or greater and Ondansetron, Dexamethasone, Midazolam and Treatment may vary due to age or medical condition  Airway Management Planned: Oral ETT  Additional Equipment:   Intra-op Plan:   Post-operative Plan: Extubation in OR  Informed Consent: I have reviewed the patients History and Physical, chart, labs and discussed the procedure including the risks, benefits and alternatives for the proposed anesthesia with the patient or authorized representative who has indicated his/her understanding and acceptance.     Dental advisory given and Consent reviewed with POA  Plan Discussed with: Anesthesiologist and  CRNA  Anesthesia Plan Comments: (Discussed with spouse in room)       Anesthesia Quick Evaluation

## 2020-07-14 NOTE — Anesthesia Postprocedure Evaluation (Signed)
Anesthesia Post Note  Patient: Dean Morgan, [name]  Procedure(s) Performed: APPENDECTOMY LAPAROSCOPIC     Patient location during evaluation: PACU Anesthesia Type: General Level of consciousness: sedated Pain management: pain level controlled Vital Signs Assessment: post-procedure vital signs reviewed and stable Respiratory status: spontaneous breathing and respiratory function stable Cardiovascular status: stable Postop Assessment: no apparent nausea or vomiting Anesthetic complications: no   No notable events documented.  Last Vitals:  Vitals:   07/14/20 1400 07/14/20 1433  BP: (!) 103/56 (!) 104/56  Pulse: (!) 58 60  Resp: 18 17  Temp:  (!) 36.4 C  SpO2: 95% 95%    Last Pain:  Vitals:   07/14/20 1433  TempSrc: Oral  PainSc:                  Dean Morgan

## 2020-07-14 NOTE — Op Note (Signed)
Operative Report  AMEER SANDEN, [name] 87 y.o. male  [id number]  [id number]  07/14/2020  Surgeon: Clovis Riley    Procedure performed: Laparoscopic Appendectomy   Preop diagnosis: Acute appendicitis   Post-op diagnosis/intraop findings: Acute appendicitis - with ruptured appendix - with peritoneal abscess - with localized peritonitis    Specimens: appendix   EBL: minimal   Complications: none   Description of procedure: After obtaining informed consent the patient was brought to the operating room. Antibiotics had been administered. SCD's were applied. General endotracheal anesthesia was initiated and a formal time-out was performed. Foley catheter inserted which is removed at the end of the case. The abdomen was prepped and draped in the usual sterile fashion and the abdomen was entered using an infraumbilical Veress needle and insufflated to 15 mmHg. A 5 mm trocar and camera were then introduced, the abdomen was inspected and there is no evidence of injury from our entry. A suprapubic 5 mm trocar and a left lower quadrant 12 mm trocar were introduced under direct visualization following infiltration with local. The patient was then placed in Trendelenburg and rotated to the left and the small bowel was reflected cephalad. The appendix was visualized: It is completely retrocecal and encased in inflammatory purulent adhesions.  There is an abscess cavity just lateral to the appendix and invading the right lateral abdominal wall.  A combination of blunt dissection and harmonic scalpel were used to free it of its retroperitoneal attachments. Great care was taken to ensure no injury to surrounding retroperitoneal structures, cecum or terminal ileum.  The appendix was noted to be ruptured/nearly completely transected in the midportion drainage of purulent fluid and a fecalith was identified.  A window was created at the base of the appendix and a blue load linear cutting stapler was used to  transect the appendix from the cecum.  Harmonic scalpel was then used to transect the appendiceal mesentery. Hemostasis was ensured.  Surgicel snow was placed over the mesentery as well.  The appendix was placed in an Endo Catch bag and removed through our 12 mm trocar site.  The right lower quadrant was irrigated and aspirated, the effluent was clear.  Purulent rind was debrided from the abdominal wall and surface of the cecum.  The 27mm trocar site in the left lower quadrant was closed with a 0 vicryl in the fascia under direct visualization using a PMI device. The abdomen was desufflated and all trocars removed. The skin incisions were closed with running subcuticular monocryl and Dermabond. The patient was awakened, extubated and transported to the recovery room in stable condition.    All counts were correct at the completion of the case.

## 2020-07-14 NOTE — Progress Notes (Signed)
Subjective/Chief Complaint: Not having much pain this morning   Objective: Vital signs in last 24 hours: Temp:  [98.8 F (37.1 C)-98.9 F (37.2 C)] 98.9 F (37.2 C) (07/07 0324) Pulse Rate:  [57-64] 58 (07/07 0716) Resp:  [11-24] 18 (07/07 0716) BP: (94-111)/(52-68) 97/60 (07/07 0716) SpO2:  [93 %-100 %] 94 % (07/07 0716) Weight:  [81.6 kg] 81.6 kg (07/06 1754)    Intake/Output from previous day: No intake/output data recorded. Intake/Output this shift: No intake/output data recorded.  Alert, well appearing Unlabored respirations Abdomen soft, focally tender right lateral mid abdomen with voluntary guarding  Lab Results:  Recent Labs    07/13/20 1702 07/14/20 0310  WBC 18.5* 15.9*  HGB 13.3 13.0  HCT 39.9 38.4*  PLT 185 166   BMET Recent Labs    07/13/20 1702 07/14/20 0310  NA 135 136  K 3.3* 3.6  CL 100 106  CO2 25 22  GLUCOSE 98 101*  BUN 39* 36*  CREATININE 1.67* 1.45*  CALCIUM 9.1 8.8*   PT/INR Recent Labs    07/13/20 1702 07/14/20 0310  LABPROT 18.1* 17.9*  INR 1.5* 1.5*   ABG No results for input(s): PHART, HCO3 in the last 72 hours.  Invalid input(s): PCO2, PO2  Studies/Results: CT ABDOMEN PELVIS W CONTRAST  Result Date: 07/13/2020 CLINICAL DATA:  Right lower quadrant pain for 3 days. EXAM: CT ABDOMEN AND PELVIS WITH CONTRAST TECHNIQUE: Multidetector CT imaging of the abdomen and pelvis was performed using the standard protocol following bolus administration of intravenous contrast. CONTRAST:  43mL ISOVUE-300 IOPAMIDOL (ISOVUE-300) INJECTION 61% COMPARISON:  None. FINDINGS: Lower Chest: No acute findings. Hepatobiliary: No hepatic masses identified. Gallbladder is unremarkable. No evidence of biliary ductal dilatation. Pancreas:  No mass or inflammatory changes. Spleen: Within normal limits in size and appearance. Adrenals/Urinary Tract: No masses identified. Mild bilateral renal parenchymal scarring noted. No evidence of ureteral calculi  or hydronephrosis. Stomach/Bowel: Findings of acute appendicitis with moderate periappendiceal inflammatory changes are noted. Details as follows: Appendix: Location- Retrocecal Diameter-10 mm Appendicolith- Absent Mucosal hyper-enhancement- Present Extraluminal Gas- Absent Periappendiceal Collection-tiny fluid collection is seen along the posterior aspect of the appendix measuring 2.2 x 0.9 cm, suspicious for abscess. Diffuse colonic diverticulosis is seen with greatest involvement of the sigmoid colon. No evidence of diverticulitis. Vascular/Lymphatic: No pathologically enlarged lymph nodes. No acute vascular findings. Aortic atherosclerotic calcification noted. Reproductive:  No mass or other significant abnormality. Other:  None. Musculoskeletal:  No suspicious bone lesions identified. IMPRESSION: Positive for acute appendicitis, with tiny periappendiceal abscess. Colonic diverticulosis, without radiographic evidence of diverticulitis. Aortic Atherosclerosis (ICD10-I70.0). These results will be called to the ordering clinician or representative by the Radiologist Assistant, and communication documented in the PACS or [employer]. Electronically Signed   By: Marlaine Hind M.D.   On: 07/13/2020 14:10    Anti-infectives: Anti-infectives (From admission, onward)    Start     Dose/Rate Route Frequency Ordered Stop   07/14/20 0600  ceFEPIme (MAXIPIME) 2 g in sodium chloride 0.9 % 100 mL IVPB       Note to Pharmacy: (Dose adjustment okay per pharmacy)   2 g 200 mL/hr over 30 Minutes Intravenous Every 12 hours 07/13/20 1955     07/14/20 0200  metroNIDAZOLE (FLAGYL) IVPB 500 mg        500 mg 100 mL/hr over 60 Minutes Intravenous Every 8 hours 07/13/20 1956     07/13/20 1800  ceFEPIme (MAXIPIME) 2 g in sodium chloride 0.9 % 100 mL  IVPB       See Hyperspace for full Linked Orders Report.   2 g 200 mL/hr over 30 Minutes Intravenous  Once 07/13/20 1757 07/13/20 2007   07/13/20 1800  metroNIDAZOLE  (FLAGYL) IVPB 500 mg       See Hyperspace for full Linked Orders Report.   500 mg 100 mL/hr over 60 Minutes Intravenous  Once 07/13/20 1757 07/13/20 2007       Assessment/Plan: A 87 year old male with acute appendicitis with tip abscess Complete heart block A. fib-on Eliquis   Discussed options of medical treatment vs surgery. Given abscess I think medical treatment will have a high failure rate and will prolong the course of treatment/ time in the hospital/ ultimately require surgery. Additionally anticipate 30% recurrence rate of appendicitis if medical therapy alone were to be successful. Therefore, I recommend proceeding with laparoscopic appendectomy. We discussed the surgery including risks of bleeding, infection, pain, scarring, injury to intra-abdominal structures, conversion to open surgery or more extensive resection, risk of staple line leak or delayed abscess, failure to resolve symptoms, postoperative ileus, incisional hernia, as well as general risks of DVT/PE, pneumonia, stroke, heart attack, death. Questions were welcomed and answered to the patient's satisfaction. We'll proceed to the operating room today as his last eliquis was 2 days ago now.   LOS: 1 day    Clovis Riley 07/14/2020

## 2020-07-14 NOTE — Progress Notes (Signed)
PROGRESS NOTE    Dean Morgan, [name]  [id number]:[id number] DOB: 06/07/1931 DOA: 07/13/2020 PCP: Ginger Organ., MD  Brief Narrative: 87 year old male with history of complete heart block status post PPM, paroxysmal atrial fibrillation on Eliquis presented to the ED with right-sided abdominal pain and chills X 1 day, had an outside CT concerning for acute appendicitis and small type abscess, labs were notable for WBC of 18.5 and creatinine of 1.6   Assessment & Plan:   Acute appendicitis -Continue cefepime, LR -N.p.o., general surgery consulting, plan for appendectomy  Acute kidney injury -Creatinine 1.6 on admission baseline around 1.0-1.1 -Continue IV fluids today, hold olmesartan  Paroxysmal atrial fibrillation History of complete heart block sp PPM -Eliquis on hold -Restart nebivolol  Hypertension -Olmesartan, amlodipine and chlorthalidone on hold -restart nebivolol  DVT prophylaxis: SCDs, start Lovenox. Code Status: Full code Family Communication: No family at bedside Disposition Plan:  Status is: Inpatient  Remains inpatient appropriate because:Inpatient level of care appropriate due to severity of illness  Dispo: The patient is from: Home              Anticipated d/c is to: Home              Patient currently is not medically stable to d/c.   Difficult to place patient No  Consultants:  General surgery  Procedures:   Antimicrobials:    Subjective: -Feels okay, mild abdominal discomfort only, denies any nausea vomiting  Objective: Vitals:   07/14/20 0700 07/14/20 0716 07/14/20 0915 07/14/20 1041  BP: 97/60 97/60 (!) 107/58 (!) 125/52  Pulse: (!) 59 (!) 58 60 (!) 59  Resp: 17 18 14 18   Temp:    98.1 F (36.7 C)  TempSrc:    Oral  SpO2: 93% 94% 97% 100%  Weight:    81.6 kg  Height:    5\' 11"  (1.803 m)    Intake/Output Summary (Last 24 hours) at 07/14/2020 1052 Last data filed at 07/14/2020 0817 Gross per 24 hour  Intake --  Output 500 ml  Net -500  ml   Filed Weights   07/13/20 1754 07/14/20 1041  Weight: 81.6 kg 81.6 kg    Examination:  General exam: Elderly male laying in bed, awake alert oriented x3, no distress HEENT: No icterus CVS: S1-S2, regular rate rhythm Lungs: Clear bilaterally Abdomen: Soft, mild right lower quadrant tenderness no rigidity or guarding, bowel sounds decreased Extremities: No edema Skin: No rashes on exposed skin Psych: Appropriate mood and affect   Data Reviewed:   CBC: Recent Labs  Lab 07/13/20 1702 07/14/20 0310  WBC 18.5* 15.9*  NEUTROABS 15.3* 13.3*  HGB 13.3 13.0  HCT 39.9 38.4*  MCV 91.9 91.0  PLT 185 834   Basic Metabolic Panel: Recent Labs  Lab 07/13/20 1702 07/14/20 0310  NA 135 136  K 3.3* 3.6  CL 100 106  CO2 25 22  GLUCOSE 98 101*  BUN 39* 36*  CREATININE 1.67* 1.45*  CALCIUM 9.1 8.8*  MG  --  1.9   GFR: Estimated Creatinine Clearance: 38.9 mL/min (A) (by C-G formula based on SCr of 1.45 mg/dL (H)). Liver Function Tests: Recent Labs  Lab 07/13/20 1702 07/14/20 0310  AST 18 16  ALT 11 12  ALKPHOS 56 47  BILITOT 1.4* 1.6*  PROT 7.2 6.5  ALBUMIN 3.3* 3.0*   Recent Labs  Lab 07/13/20 1702  LIPASE 43   No results for input(s): AMMONIA in the last 168 hours. Coagulation Profile: Recent  Labs  Lab 07/13/20 1702 07/14/20 0310  INR 1.5* 1.5*   Cardiac Enzymes: No results for input(s): CKTOTAL, CKMB, CKMBINDEX, TROPONINI in the last 168 hours. BNP (last 3 results) No results for input(s): PROBNP in the last 8760 hours. HbA1C: No results for input(s): HGBA1C in the last 72 hours. CBG: No results for input(s): GLUCAP in the last 168 hours. Lipid Profile: No results for input(s): CHOL, HDL, LDLCALC, TRIG, CHOLHDL, LDLDIRECT in the last 72 hours. Thyroid Function Tests: No results for input(s): TSH, T4TOTAL, FREET4, T3FREE, THYROIDAB in the last 72 hours. Anemia Panel: No results for input(s): VITAMINB12, FOLATE, FERRITIN, TIBC, IRON, RETICCTPCT in  the last 72 hours. Urine analysis:    Component Value Date/Time   COLORURINE YELLOW 07/14/2020 0950   APPEARANCEUR CLEAR 07/14/2020 0950   LABSPEC 1.039 (H) 07/14/2020 0950   PHURINE 5.0 07/14/2020 0950   GLUCOSEU NEGATIVE 07/14/2020 0950   HGBUR NEGATIVE 07/14/2020 0950   BILIRUBINUR NEGATIVE 07/14/2020 0950   KETONESUR 5 (A) 07/14/2020 0950   PROTEINUR NEGATIVE 07/14/2020 0950   NITRITE NEGATIVE 07/14/2020 0950   LEUKOCYTESUR NEGATIVE 07/14/2020 0950   Sepsis Labs: @LABRCNTIP (procalcitonin:4,lacticidven:4)  ) Recent Results (from the past 240 hour(s))  Resp Panel by RT-PCR (Flu A&B, Covid) Nasopharyngeal Swab     Status: None   Collection Time: 07/13/20  7:00 PM   Specimen: Nasopharyngeal Swab; Nasopharyngeal(NP) swabs in vial transport medium  Result Value Ref Range Status   SARS Coronavirus 2 by RT PCR NEGATIVE NEGATIVE Final    Comment: (NOTE) SARS-CoV-2 target nucleic acids are NOT DETECTED.  The SARS-CoV-2 RNA is generally detectable in upper respiratory specimens during the acute phase of infection. The lowest concentration of SARS-CoV-2 viral copies this assay can detect is 138 copies/mL. A negative result does not preclude SARS-Cov-2 infection and should not be used as the sole basis for treatment or other patient management decisions. A negative result may occur with  improper specimen collection/handling, submission of specimen other than nasopharyngeal swab, presence of viral mutation(s) within the areas targeted by this assay, and inadequate number of viral copies(<138 copies/mL). A negative result must be combined with clinical observations, patient history, and epidemiological information. The expected result is Negative.  Fact Sheet for Patients:  EntrepreneurPulse.com.au  Fact Sheet for Healthcare Providers:  IncredibleEmployment.be  This test is no t yet approved or cleared by the [place] FDA and  has been  authorized for detection and/or diagnosis of SARS-CoV-2 by FDA under an Emergency Use Authorization (EUA). This EUA will remain  in effect (meaning this test can be used) for the duration of the COVID-19 declaration under Section 564(b)(1) of the Act, 21 U.S.C.section 360bbb-3(b)(1), unless the authorization is terminated  or revoked sooner.       Influenza A by PCR NEGATIVE NEGATIVE Final   Influenza B by PCR NEGATIVE NEGATIVE Final    Comment: (NOTE) The Xpert Xpress SARS-CoV-2/FLU/RSV plus assay is intended as an aid in the diagnosis of influenza from Nasopharyngeal swab specimens and should not be used as a sole basis for treatment. Nasal washings and aspirates are unacceptable for Xpert Xpress SARS-CoV-2/FLU/RSV testing.  Fact Sheet for Patients: EntrepreneurPulse.com.au  Fact Sheet for Healthcare Providers: IncredibleEmployment.be  This test is not yet approved or cleared by the [place] FDA and has been authorized for detection and/or diagnosis of SARS-CoV-2 by FDA under an Emergency Use Authorization (EUA). This EUA will remain in effect (meaning this test can be used) for the duration of the COVID-19 declaration  under Section 564(b)(1) of the Act, 21 U.S.C. section 360bbb-3(b)(1), unless the authorization is terminated or revoked.  Performed at [facility], [place] [address], [place], [place] [address]          Radiology Studies: CT ABDOMEN PELVIS W CONTRAST  Result Date: 07/13/2020 CLINICAL DATA:  Right lower quadrant pain for 3 days. EXAM: CT ABDOMEN AND PELVIS WITH CONTRAST TECHNIQUE: Multidetector CT imaging of the abdomen and pelvis was performed using the standard protocol following bolus administration of intravenous contrast. CONTRAST:  39mL ISOVUE-300 IOPAMIDOL (ISOVUE-300) INJECTION 61% COMPARISON:  None. FINDINGS: Lower Chest: No acute findings. Hepatobiliary: No hepatic masses identified. Gallbladder is  unremarkable. No evidence of biliary ductal dilatation. Pancreas:  No mass or inflammatory changes. Spleen: Within normal limits in size and appearance. Adrenals/Urinary Tract: No masses identified. Mild bilateral renal parenchymal scarring noted. No evidence of ureteral calculi or hydronephrosis. Stomach/Bowel: Findings of acute appendicitis with moderate periappendiceal inflammatory changes are noted. Details as follows: Appendix: Location- Retrocecal Diameter-10 mm Appendicolith- Absent Mucosal hyper-enhancement- Present Extraluminal Gas- Absent Periappendiceal Collection-tiny fluid collection is seen along the posterior aspect of the appendix measuring 2.2 x 0.9 cm, suspicious for abscess. Diffuse colonic diverticulosis is seen with greatest involvement of the sigmoid colon. No evidence of diverticulitis. Vascular/Lymphatic: No pathologically enlarged lymph nodes. No acute vascular findings. Aortic atherosclerotic calcification noted. Reproductive:  No mass or other significant abnormality. Other:  None. Musculoskeletal:  No suspicious bone lesions identified. IMPRESSION: Positive for acute appendicitis, with tiny periappendiceal abscess. Colonic diverticulosis, without radiographic evidence of diverticulitis. Aortic Atherosclerosis (ICD10-I70.0). These results will be called to the ordering clinician or representative by the Radiologist Assistant, and communication documented in the PACS or [employer]. Electronically Signed   By: Marlaine Hind M.D.   On: 07/13/2020 14:10        Scheduled Meds:  chlorhexidine  15 mL Mouth/Throat Once   Or   mouth rinse  15 mL Mouth Rinse Once   chlorhexidine       Continuous Infusions:  [MAR Hold] ceFEPime (MAXIPIME) IV Stopped (07/14/20 0810)   lactated ringers 75 mL/hr at 07/14/20 1024   lactated ringers     [MAR Hold] metronidazole 500 mg (07/14/20 0952)     LOS: 1 day    Time spent: 67min    Domenic Polite, MD [facility]   07/14/2020,  10:52 AM

## 2020-07-14 NOTE — Transfer of Care (Signed)
Immediate Anesthesia Transfer of Care Note  Patient: [name], [name]  Procedure(s) Performed: APPENDECTOMY LAPAROSCOPIC  Patient Location: PACU  Anesthesia Type:General  Level of Consciousness: awake, alert  and oriented  Airway & Oxygen Therapy: Patient Spontanous Breathing  Post-op Assessment: Report given to RN, Post -op Vital signs reviewed and stable and Patient moving all extremities  Post vital signs: Reviewed and stable  Last Vitals:  Vitals Value Taken Time  BP 117/80 07/14/20 1247  Temp    Pulse 62 07/14/20 1248  Resp 18 07/14/20 1248  SpO2 94 % 07/14/20 1248  Vitals shown include unvalidated device data.  Last Pain:  Vitals:   07/14/20 1041  TempSrc: Oral  PainSc:          Complications: No notable events documented.

## 2020-07-14 NOTE — ED Notes (Signed)
Patient repositioned back in the bed. Wife back at bedside. Patient alert & orientated to self, unsure of year and situation at this time.

## 2020-07-15 ENCOUNTER — Other Ambulatory Visit: Payer: Self-pay

## 2020-07-15 ENCOUNTER — Encounter (HOSPITAL_COMMUNITY): Payer: Self-pay | Admitting: Surgery

## 2020-07-15 LAB — BASIC METABOLIC PANEL
Anion gap: 10 (ref 5–15)
BUN: 29 mg/dL — ABNORMAL HIGH (ref 8–23)
CO2: 22 mmol/L (ref 22–32)
Calcium: 9.2 mg/dL (ref 8.9–10.3)
Chloride: 106 mmol/L (ref 98–111)
Creatinine, Ser: 1.24 mg/dL (ref 0.61–1.24)
GFR, Estimated: 57 mL/min — ABNORMAL LOW (ref >60)
Glucose, Bld: 173 mg/dL — ABNORMAL HIGH (ref 70–99)
Potassium: 4.7 mmol/L (ref 3.5–5.1)
Sodium: 138 mmol/L (ref 135–145)

## 2020-07-15 LAB — CBC
HCT: 38.8 % — ABNORMAL LOW (ref 39.0–52.0)
Hemoglobin: 13.5 g/dL (ref 13.0–17.0)
MCH: 31 pg (ref 26.0–34.0)
MCHC: 34.8 g/dL (ref 30.0–36.0)
MCV: 89.2 fL (ref 80.0–100.0)
Platelets: 178 10*3/uL (ref 150–400)
RBC: 4.35 MIL/uL (ref 4.22–5.81)
RDW: 13.5 % (ref 11.5–15.5)
WBC: 14.9 10*3/uL — ABNORMAL HIGH (ref 4.0–10.5)
nRBC: 0 % (ref 0.0–0.2)

## 2020-07-15 LAB — SURGICAL PATHOLOGY

## 2020-07-15 MED ORDER — METRONIDAZOLE 500 MG PO TABS: 500.0000 mg | ORAL_TABLET | Freq: Three times a day (TID) | ORAL | 0 refills | Status: AC

## 2020-07-15 MED ORDER — TRAMADOL HCL 50 MG PO TABS: 50.0000 mg | ORAL_TABLET | Freq: Four times a day (QID) | ORAL | 0 refills | Status: AC | PRN

## 2020-07-15 MED ORDER — APIXABAN 5 MG PO TABS: 5.0000 mg | ORAL_TABLET | Freq: Two times a day (BID) | ORAL | Status: AC

## 2020-07-15 MED ORDER — CIPROFLOXACIN HCL 500 MG PO TABS: 500.0000 mg | ORAL_TABLET | Freq: Two times a day (BID) | ORAL | 0 refills | Status: AC

## 2020-07-15 MED ORDER — DOCUSATE SODIUM 100 MG PO CAPS: 100.0000 mg | ORAL_CAPSULE | Freq: Two times a day (BID) | ORAL | 0 refills | Status: AC

## 2020-07-15 NOTE — Discharge Summary (Signed)
Physician Discharge Summary  Dean Morgan, [name] YWV:[id number] DOB: 1931-12-17 DOA: 07/13/2020  PCP: Ginger Organ., MD  Admit date: 07/13/2020 Discharge date: 07/15/2020  Time spent: 35 minutes  Recommendations for Outpatient Follow-up:  Gen Surgery in 1-2weeks   Discharge Diagnoses:  Principal Problem:   Acute appendicitis Active Problems:   Hyperlipidemia   Atrial fibrillation, permanent ([place])   Essential hypertension   Abdominal pain   Hypokalemia   AKI (acute kidney injury) [facility])   Discharge Condition: stable  Diet recommendation: soft diet  Filed Weights   07/13/20 1754 07/14/20 1041  Weight: 81.6 kg 81.6 kg    History of present illness: 87 year old male with history of complete heart block status post PPM, paroxysmal atrial fibrillation on Eliquis presented to the ED with right-sided abdominal pain and chills X 1 day, had an outside CT concerning for acute appendicitis and small abscess, labs were notable for WBC of 18.5 and creatinine of 1.6    Hospital Course:   Acute appendicitis with tip abscess -treated with bowel rest, IV Abx, General surgery consulted, underwent appendectomy yesterday 7/7 which noted perforation and tip abscess -improved and stable now, tolerating diet, ambulating well -cleared for discharge by surgical team today on Abx for 7days -FU with Gen.Surgery, restart eliquis tomorrow   Acute kidney injury -Creatinine 1.6 on admission baseline around 1.0-1.1 -improved with hydration   Paroxysmal atrial fibrillation History of complete heart block sp PPM -Eliquis to be resumed tomorrow -Restart nebivolol   Hypertension -resumed home meds  Mild Dementia -continue aricept  Procedures: Procedure performed: Laparoscopic Appendectomy Dr.Connor 7/7   Preop diagnosis: Acute appendicitis   Post-op diagnosis/intraop findings: Acute appendicitis - with ruptured appendix - with peritoneal abscess - with localized peritonitis    Discharge Exam: Vitals:   07/15/20 0749 07/15/20 1133  BP: 106/69 94/60  Pulse: 60 60  Resp: 15 17  Temp: (!) 97.5 F (36.4 C) 97.6 F (36.4 C)  SpO2: 97% 96%    General: AAOx2 Cardiovascular: S1S2/RRR Respiratory: CTAB  Discharge Instructions   Discharge Instructions     Diet - low sodium heart healthy   Complete by: As directed    Discharge wound care:   Complete by: As directed    routine   Increase activity slowly   Complete by: As directed       Allergies as of 07/15/2020       Reactions   Lipitor [atorvastatin] Other (See Comments)   Muscle pain   Penicillins Rash   Has patient had a PCN reaction causing immediate rash, facial/tongue/throat swelling, SOB or lightheadedness with hypotension: Unknown Has patient had a PCN reaction causing severe rash involving mucus membranes or skin necrosis: Unknown Has patient had a PCN reaction that required hospitalization: No Has patient had a PCN reaction occurring within the last 10 years: No If all of the above answers are "NO", then may proceed with Cephalosporin use.   Vytorin [ezetimibe-simvastatin] Other (See Comments)   Muscle pain        Medication List     TAKE these medications    acetaminophen 325 MG tablet Commonly known as: TYLENOL Take 650 mg by mouth every 6 (six) hours as needed for mild pain.   allopurinol 300 MG tablet Commonly known as: ZYLOPRIM Take 300 mg by mouth daily.   apixaban 5 MG Tabs tablet Commonly known as: ELIQUIS Take 1 tablet (5 mg total) by mouth 2 (two) times daily. Start taking on: July 16, 2020  Arginine 1000 MG Tabs Take 1,000 mg by mouth daily.   aspirin EC 81 MG tablet Take 81 mg by mouth at bedtime as needed for mild pain.   chlorthalidone 25 MG tablet Commonly known as: HYGROTON Take 0.5 tablets (12.5 mg total) by mouth daily.   ciprofloxacin 500 MG tablet Commonly known as: Cipro Take 1 tablet (500 mg total) by mouth 2 (two) times daily for 7 days.    colchicine 0.6 MG tablet Take 0.6 mg by mouth daily as needed (gout flares).   COSAMIN DS PO Take 1 capsule by mouth 2 (two) times daily with a meal.   docusate sodium 100 MG capsule Commonly known as: Colace Take 1 capsule (100 mg total) by mouth 2 (two) times daily. Okay to decrease to once daily or stop taking if having loose bowel movements   donepezil 10 MG tablet Commonly known as: ARICEPT Take 1 tablet (10 mg total) by mouth every morning.   fish oil-omega-3 fatty acids 1000 MG capsule Take 1 g by mouth 2 (two) times daily.   memantine 10 MG tablet Commonly known as: NAMENDA Take 1 tablet (10 mg total) by mouth 2 (two) times daily.   metroNIDAZOLE 500 MG tablet Commonly known as: Flagyl Take 1 tablet (500 mg total) by mouth 3 (three) times daily for 7 days.   niacin 500 MG tablet Commonly known as: SLO-NIACIN Take 500 mg by mouth 2 (two) times daily with a meal.   olmesartan 40 MG tablet Commonly known as: BENICAR Take 40 mg by mouth daily.   traMADol 50 MG tablet Commonly known as: Ultram Take 1 tablet (50 mg total) by mouth every 6 (six) hours as needed for up to 3 days (pain not relieved by tylenol, ibuprofen, ice or rest).   Vitamin D3 125 MCG (5000 UT) Tabs Take 5,000 Units by mouth daily.       ASK your doctor about these medications    amLODipine 5 MG tablet Commonly known as: NORVASC TAKE 1 TABLET BY MOUTH EVERY DAY   ezetimibe 10 MG tablet Commonly known as: ZETIA Take 1 tablet (10 mg total) by mouth daily.   Nebivolol HCl 20 MG Tabs TAKE 2 TABLET BY MOUTH EVERY DAY   potassium chloride SA 20 MEQ tablet Commonly known as: KLOR-CON TAKE 1 TABLET BY MOUTH daily               Discharge Care Instructions  (From admission, onward)           Start     Ordered   07/15/20 0000  Discharge wound care:       Comments: routine   07/15/20 1025           Allergies  Allergen Reactions   Lipitor [Atorvastatin] Other (See  Comments)    Muscle pain   Penicillins Rash    Has patient had a PCN reaction causing immediate rash, facial/tongue/throat swelling, SOB or lightheadedness with hypotension: Unknown Has patient had a PCN reaction causing severe rash involving mucus membranes or skin necrosis: Unknown Has patient had a PCN reaction that required hospitalization: No Has patient had a PCN reaction occurring within the last 10 years: No If all of the above answers are "NO", then may proceed with Cephalosporin use.    Vytorin [Ezetimibe-Simvastatin] Other (See Comments)    Muscle pain    Follow-up Information     [facility] Follow up on 08/02/2020.   Specialty: General Surgery Why: 08/02/20 at  9 am. Please arrive 30 minutes prior to your appointment for paperwork. Please bring a copy of your photo ID and insurance card. Contact information: [address] [place] [phone]                 The results of significant diagnostics from this hospitalization (including imaging, microbiology, ancillary and laboratory) are listed below for reference.    Significant Diagnostic Studies: CT ABDOMEN PELVIS W CONTRAST  Result Date: 07/13/2020 CLINICAL DATA:  Right lower quadrant pain for 3 days. EXAM: CT ABDOMEN AND PELVIS WITH CONTRAST TECHNIQUE: Multidetector CT imaging of the abdomen and pelvis was performed using the standard protocol following bolus administration of intravenous contrast. CONTRAST:  56mL ISOVUE-300 IOPAMIDOL (ISOVUE-300) INJECTION 61% COMPARISON:  None. FINDINGS: Lower Chest: No acute findings. Hepatobiliary: No hepatic masses identified. Gallbladder is unremarkable. No evidence of biliary ductal dilatation. Pancreas:  No mass or inflammatory changes. Spleen: Within normal limits in size and appearance. Adrenals/Urinary Tract: No masses identified. Mild bilateral renal parenchymal scarring noted. No evidence of ureteral calculi or  hydronephrosis. Stomach/Bowel: Findings of acute appendicitis with moderate periappendiceal inflammatory changes are noted. Details as follows: Appendix: Location- Retrocecal Diameter-10 mm Appendicolith- Absent Mucosal hyper-enhancement- Present Extraluminal Gas- Absent Periappendiceal Collection-tiny fluid collection is seen along the posterior aspect of the appendix measuring 2.2 x 0.9 cm, suspicious for abscess. Diffuse colonic diverticulosis is seen with greatest involvement of the sigmoid colon. No evidence of diverticulitis. Vascular/Lymphatic: No pathologically enlarged lymph nodes. No acute vascular findings. Aortic atherosclerotic calcification noted. Reproductive:  No mass or other significant abnormality. Other:  None. Musculoskeletal:  No suspicious bone lesions identified. IMPRESSION: Positive for acute appendicitis, with tiny periappendiceal abscess. Colonic diverticulosis, without radiographic evidence of diverticulitis. Aortic Atherosclerosis (ICD10-I70.0). These results will be called to the ordering clinician or representative by the Radiologist Assistant, and communication documented in the PACS or [employer]. Electronically Signed   By: Marlaine Hind M.D.   On: 07/13/2020 14:10    Microbiology: Recent Results (from the past 240 hour(s))  Resp Panel by RT-PCR (Flu A&B, Covid) Nasopharyngeal Swab     Status: None   Collection Time: 07/13/20  7:00 PM   Specimen: Nasopharyngeal Swab; Nasopharyngeal(NP) swabs in vial transport medium  Result Value Ref Range Status   SARS Coronavirus 2 by RT PCR NEGATIVE NEGATIVE Final    Comment: (NOTE) SARS-CoV-2 target nucleic acids are NOT DETECTED.  The SARS-CoV-2 RNA is generally detectable in upper respiratory specimens during the acute phase of infection. The lowest concentration of SARS-CoV-2 viral copies this assay can detect is 138 copies/mL. A negative result does not preclude SARS-Cov-2 infection and should not be used as the sole  basis for treatment or other patient management decisions. A negative result may occur with  improper specimen collection/handling, submission of specimen other than nasopharyngeal swab, presence of viral mutation(s) within the areas targeted by this assay, and inadequate number of viral copies(<138 copies/mL). A negative result must be combined with clinical observations, patient history, and epidemiological information. The expected result is Negative.  Fact Sheet for Patients:  EntrepreneurPulse.com.au  Fact Sheet for Healthcare Providers:  IncredibleEmployment.be  This test is no t yet approved or cleared by the [place] FDA and  has been authorized for detection and/or diagnosis of SARS-CoV-2 by FDA under an Emergency Use Authorization (EUA). This EUA will remain  in effect (meaning this test can be used) for the duration of the COVID-19 declaration under Section 564(b)(1)  of the Act, 21 U.S.C.section 360bbb-3(b)(1), unless the authorization is terminated  or revoked sooner.       Influenza A by PCR NEGATIVE NEGATIVE Final   Influenza B by PCR NEGATIVE NEGATIVE Final    Comment: (NOTE) The Xpert Xpress SARS-CoV-2/FLU/RSV plus assay is intended as an aid in the diagnosis of influenza from Nasopharyngeal swab specimens and should not be used as a sole basis for treatment. Nasal washings and aspirates are unacceptable for Xpert Xpress SARS-CoV-2/FLU/RSV testing.  Fact Sheet for Patients: EntrepreneurPulse.com.au  Fact Sheet for Healthcare Providers: IncredibleEmployment.be  This test is not yet approved or cleared by the [place] FDA and has been authorized for detection and/or diagnosis of SARS-CoV-2 by FDA under an Emergency Use Authorization (EUA). This EUA will remain in effect (meaning this test can be used) for the duration of the COVID-19 declaration under Section 564(b)(1) of the Act,  21 U.S.C. section 360bbb-3(b)(1), unless the authorization is terminated or revoked.  Performed at [facility], [place] [address] [place], [place] [address]      Labs: Basic Metabolic Panel: Recent Labs  Lab 07/13/20 1702 07/14/20 0310 07/15/20 0413  NA 135 136 138  K 3.3* 3.6 4.7  CL 100 106 106  CO2 25 22 22   GLUCOSE 98 101* 173*  BUN 39* 36* 29*  CREATININE 1.67* 1.45* 1.24  CALCIUM 9.1 8.8* 9.2  MG  --  1.9  --    Liver Function Tests: Recent Labs  Lab 07/13/20 1702 07/14/20 0310  AST 18 16  ALT 11 12  ALKPHOS 56 47  BILITOT 1.4* 1.6*  PROT 7.2 6.5  ALBUMIN 3.3* 3.0*   Recent Labs  Lab 07/13/20 1702  LIPASE 43   No results for input(s): AMMONIA in the last 168 hours. CBC: Recent Labs  Lab 07/13/20 1702 07/14/20 0310 07/15/20 0413  WBC 18.5* 15.9* 14.9*  NEUTROABS 15.3* 13.3*  --   HGB 13.3 13.0 13.5  HCT 39.9 38.4* 38.8*  MCV 91.9 91.0 89.2  PLT 185 166 178   Cardiac Enzymes: No results for input(s): CKTOTAL, CKMB, CKMBINDEX, TROPONINI in the last 168 hours. BNP: BNP (last 3 results) No results for input(s): BNP in the last 8760 hours.  ProBNP (last 3 results) No results for input(s): PROBNP in the last 8760 hours.  CBG: No results for input(s): GLUCAP in the last 168 hours.     Signed:  Domenic Polite MD.  [facility] 07/15/2020, 1:47 PM

## 2020-07-15 NOTE — Discharge Instructions (Signed)
[facility] ______[facility] LAPAROSCOPIC SURGERY: POST OP INSTRUCTIONS Always review your discharge instruction sheet given to you by the facility where your surgery was performed. IF YOU HAVE DISABILITY OR FAMILY LEAVE FORMS, YOU MUST BRING THEM TO THE OFFICE FOR PROCESSING.   DO NOT GIVE THEM TO YOUR DOCTOR.  A prescription for pain medication may be given to you upon discharge.  Take your pain medication as prescribed, if needed.  If narcotic pain medicine is not needed, then you may take acetaminophen (Tylenol) or ibuprofen (Advil) as needed. Take your usually prescribed medications unless otherwise directed. If you need a refill on your pain medication, please contact your pharmacy.  They will contact our office to request authorization. Prescriptions will not be filled after 5pm or on week-ends. You should follow a light diet the first few days after arrival home, such as soup and crackers, etc.  Be sure to include lots of fluids daily. Most patients will experience some swelling and bruising in the area of the incisions.  Ice packs will help.  Swelling and bruising can take several days to resolve.  It is common to experience some constipation if taking pain medication after surgery.  Increasing fluid intake and taking a stool softener (such as Colace) will usually help or prevent this problem from occurring.  A mild laxative (Milk of Magnesia or Miralax) should be taken according to package instructions if there are no bowel movements after 48 hours. Unless discharge instructions indicate otherwise, you may remove your bandages 24-48 hours after surgery, and you may shower at that time.  You may have steri-strips (small skin tapes) in place directly over the incision.  These strips should be left on the skin for 7-10 days.  If your surgeon used skin glue on the incision, you may shower in 24 hours.  The glue will flake off over the next 2-3 weeks.  Any sutures or staples will be  removed at the office during your follow-up visit. ACTIVITIES:  You may resume regular (light) daily activities beginning the next day--such as daily self-care, walking, climbing stairs--gradually increasing activities as tolerated.  You may have sexual intercourse when it is comfortable.  Refrain from any heavy lifting or straining until approved by your doctor. You may drive when you are no longer taking prescription pain medication, you can comfortably wear a seatbelt, and you can safely maneuver your car and apply brakes. RETURN TO WORK:  __________________________________________________________ You should see your doctor in the office for a follow-up appointment approximately 2-3 weeks after your surgery.  Make sure that you call for this appointment within a day or two after you arrive home to insure a convenient appointment time. OTHER INSTRUCTIONS: __________________________________________________________________________________________________________________________ __________________________________________________________________________________________________________________________ WHEN TO CALL YOUR DOCTOR: Fever over 101.0 Inability to urinate Continued bleeding from incision. Increased pain, redness, or drainage from the incision. Increasing abdominal pain  The clinic staff is available to answer your questions during regular business hours.  Please don't hesitate to call and ask to speak to one of the nurses for clinical concerns.  If you have a medical emergency, go to the nearest emergency room or call 911.  A surgeon from [facility] is always on call at the hospital. [address], [address], [place], [place]  [address] ? [address] [address], [place], [place]   [address] [phone] ? [phone] ? FAX [phone] Web site: [email or web address]  

## 2020-07-15 NOTE — Progress Notes (Signed)
1 Day Post-Op   Subjective/Chief Complaint: Feels great.    Objective: Vital signs in last 24 hours: Temp:  [96.8 F (36 C)-98.1 F (36.7 C)] 97.5 F (36.4 C) (07/08 0749) Pulse Rate:  [58-63] 60 (07/08 0749) Resp:  [13-22] 15 (07/08 0749) BP: (100-125)/(52-80) 106/69 (07/08 0749) SpO2:  [94 %-100 %] 97 % (07/08 0749) Weight:  [81.6 kg] 81.6 kg (07/07 1041) Last BM Date:  (PTA)  Intake/Output from previous day: 07/07 0701 - 07/08 0700 In: 1970.5 [P.O.:240; I.V.:1060.1; IV Piggyback:670.4] Out: 575 [Urine:550; Blood:25] Intake/Output this shift: No intake/output data recorded.  Alert, well appearing Unlabored respirations Abdomen soft, incisions c/d/I with dermabond. Still mildly tender on R lateral abdomen where abscess rind was  Lab Results:  Recent Labs    07/14/20 0310 07/15/20 0413  WBC 15.9* 14.9*  HGB 13.0 13.5  HCT 38.4* 38.8*  PLT 166 178    BMET Recent Labs    07/14/20 0310 07/15/20 0413  NA 136 138  K 3.6 4.7  CL 106 106  CO2 22 22  GLUCOSE 101* 173*  BUN 36* 29*  CREATININE 1.45* 1.24  CALCIUM 8.8* 9.2    PT/INR Recent Labs    07/13/20 1702 07/14/20 0310  LABPROT 18.1* 17.9*  INR 1.5* 1.5*    ABG No results for input(s): PHART, HCO3 in the last 72 hours.  Invalid input(s): PCO2, PO2  Studies/Results: CT ABDOMEN PELVIS W CONTRAST  Result Date: 07/13/2020 CLINICAL DATA:  Right lower quadrant pain for 3 days. EXAM: CT ABDOMEN AND PELVIS WITH CONTRAST TECHNIQUE: Multidetector CT imaging of the abdomen and pelvis was performed using the standard protocol following bolus administration of intravenous contrast. CONTRAST:  64mL ISOVUE-300 IOPAMIDOL (ISOVUE-300) INJECTION 61% COMPARISON:  None. FINDINGS: Lower Chest: No acute findings. Hepatobiliary: No hepatic masses identified. Gallbladder is unremarkable. No evidence of biliary ductal dilatation. Pancreas:  No mass or inflammatory changes. Spleen: Within normal limits in size and  appearance. Adrenals/Urinary Tract: No masses identified. Mild bilateral renal parenchymal scarring noted. No evidence of ureteral calculi or hydronephrosis. Stomach/Bowel: Findings of acute appendicitis with moderate periappendiceal inflammatory changes are noted. Details as follows: Appendix: Location- Retrocecal Diameter-10 mm Appendicolith- Absent Mucosal hyper-enhancement- Present Extraluminal Gas- Absent Periappendiceal Collection-tiny fluid collection is seen along the posterior aspect of the appendix measuring 2.2 x 0.9 cm, suspicious for abscess. Diffuse colonic diverticulosis is seen with greatest involvement of the sigmoid colon. No evidence of diverticulitis. Vascular/Lymphatic: No pathologically enlarged lymph nodes. No acute vascular findings. Aortic atherosclerotic calcification noted. Reproductive:  No mass or other significant abnormality. Other:  None. Musculoskeletal:  No suspicious bone lesions identified. IMPRESSION: Positive for acute appendicitis, with tiny periappendiceal abscess. Colonic diverticulosis, without radiographic evidence of diverticulitis. Aortic Atherosclerosis (ICD10-I70.0). These results will be called to the ordering clinician or representative by the Radiologist Assistant, and communication documented in the PACS or [employer]. Electronically Signed   By: Marlaine Hind M.D.   On: 07/13/2020 14:10    Anti-infectives: Anti-infectives (From admission, onward)    Start     Dose/Rate Route Frequency Ordered Stop   07/14/20 1800  ceFEPIme (MAXIPIME) 2 g in sodium chloride 0.9 % 100 mL IVPB        2 g 200 mL/hr over 30 Minutes Intravenous Every 12 hours 07/14/20 1509     07/14/20 0600  ceFEPIme (MAXIPIME) 2 g in sodium chloride 0.9 % 100 mL IVPB  Status:  Discontinued       Note to Pharmacy: (Dose adjustment okay  per pharmacy)   2 g 200 mL/hr over 30 Minutes Intravenous Every 12 hours 07/13/20 1955 07/14/20 1428   07/14/20 0200  metroNIDAZOLE (FLAGYL) IVPB 500 mg         500 mg 100 mL/hr over 60 Minutes Intravenous Every 8 hours 07/13/20 1956     07/13/20 1800  ceFEPIme (MAXIPIME) 2 g in sodium chloride 0.9 % 100 mL IVPB       See Hyperspace for full Linked Orders Report.   2 g 200 mL/hr over 30 Minutes Intravenous  Once 07/13/20 1757 07/13/20 2007   07/13/20 1800  metroNIDAZOLE (FLAGYL) IVPB 500 mg       See Hyperspace for full Linked Orders Report.   500 mg 100 mL/hr over 60 Minutes Intravenous  Once 07/13/20 1757 07/13/20 2007       Assessment/Plan: A 87 year old male with acute appendicitis with tip abscess Complete heart block A. fib-on Eliquis  S/p lap appendectomy 7/7- perforated with localized abscess  Tolerating a diet, pain well controlled. Will request PT eval for home needs. After that OK for discharge from surgery standpoint. Resume eliquis tomorrow. Continue antibiotics x 1 week.    LOS: 2 days    Dean Morgan 07/15/2020

## 2020-07-15 NOTE — Evaluation (Signed)
Physical Therapy Evaluation Patient Details Name: Dean Morgan, Meister [name] MRN: [id number] DOB: December 31, 1931 Today's Date: 07/15/2020   History of Present Illness  87 yo admitted 7/6 with abdominal pain and appendicitis with abscess s/p lap appendectomy. PMhx: Afib, heart block s/p PPM, HTN, HLD  Clinical Impression  Pt demonstrates decreased ability to perform bed mobility and has generalized muscle weakness. Pt ambulated 473ft with RW and navigated 2 stairs with min guard. Recommend continuation of therapy to increase function and decrease caregiver burden.     Follow Up Recommendations No PT follow up    Equipment Recommendations  None recommended by PT    Recommendations for Other Services       Precautions / Restrictions        Mobility  Bed Mobility Overal bed mobility: Needs Assistance Bed Mobility: Supine to Sit     Supine to sit: Min assist     General bed mobility comments: Pt in bed prior to PT, HR 60 room air spO2 95LPM. Pt educated on log rolling for supine to sit. Bed elevated similar to bed at home.    Transfers Overall transfer level: Needs assistance Equipment used: Rolling walker (2 wheeled) Transfers: Sit to/from Stand Sit to Stand: Supervision         General transfer comment: Required verbal cueing hand placement  Ambulation/Gait Ambulation/Gait assistance: Modified independent (Device/Increase time) Gait Distance (Feet): 400 Feet Assistive device: Rolling walker (2 wheeled) Gait Pattern/deviations: WFL(Within Functional Limits);Step-through pattern   Gait velocity interpretation: >2.62 ft/sec, indicative of community ambulatory    Stairs Stairs: Yes Stairs assistance: Min guard Stair Management: One rail Left Number of Stairs: 2 General stair comments: Pt used railing to navigate 2 stairs similar to stairs at home.  Wheelchair Mobility    Modified Rankin (Stroke Patients Only)       Balance                                              Pertinent Vitals/Pain Pain Assessment: No/denies pain (States slight soreness)    Home Living Family/patient expects to be discharged to:: Private residence Living Arrangements: Spouse/significant other Available Help at Discharge: Available 24 hours/day;Family (States cannot support weight if falls) Type of Home: House Home Access: Stairs to enter   [employer] of Steps: 2 Home Layout: Two level;Able to live on main level with bedroom/bathroom Home Equipment: Walker - 4 wheels Additional Comments: Rollator used 2-3x/week. Particulary in the morning.    Prior Function Level of Independence: Independent               Hand Dominance   Dominant Hand: Right    Extremity/Trunk Assessment                Communication   Communication: No difficulties  Cognition Arousal/Alertness: Awake/alert Behavior During Therapy: WFL for tasks assessed/performed Overall Cognitive Status: Within Functional Limits for tasks assessed                                        General Comments      Exercises     Assessment/Plan    PT Assessment Patient needs continued PT services  PT Problem List Decreased strength;Decreased mobility       PT Treatment Interventions Gait training;Stair training  PT Goals (Current goals can be found in the Care Plan section)  Acute Rehab PT Goals Patient Stated Goal: Return home PT Goal Formulation: With patient Time For Goal Achievement: 07/29/20 Potential to Achieve Goals: Good    Frequency Min 3X/week   Barriers to discharge        Co-evaluation               AM-PAC PT "6 Clicks" Mobility  Outcome Measure Help needed turning from your back to your side while in a flat bed without using bedrails?: A Little Help needed moving from lying on your back to sitting on the side of a flat bed without using bedrails?: A Little Help needed moving to and from a bed to a chair  (including a wheelchair)?: None Help needed standing up from a chair using your arms (e.g., wheelchair or bedside chair)?: None Help needed to walk in hospital room?: None Help needed climbing 3-5 steps with a railing? : A Little 6 Click Score: 21    End of Session   Activity Tolerance: Patient tolerated treatment well Patient left: in chair;with call bell/phone within reach;with family/visitor present Nurse Communication: Mobility status PT Visit Diagnosis: Other abnormalities of gait and mobility (R26.89);History of falling (Z91.81);Muscle weakness (generalized) (M62.81)    Time: 2683-4196 PT Time Calculation (min) (ACUTE ONLY): 27 min   Charges:   PT Evaluation $PT Eval Moderate Complexity: 1 Mod PT Treatments $Gait Training: 8-22 mins        Louie Casa, SPT Acute Rehab: [phone]   Domingo Dimes 07/15/2020, 2:12 PM

## 2020-07-15 NOTE — Progress Notes (Signed)
D/C education given to Pt and all questions answered. No printed prescriptions to give or equipment to deliver. IV removed. Pt taken to car with all belongings.   

## 2020-07-20 ENCOUNTER — Ambulatory Visit (INDEPENDENT_AMBULATORY_CARE_PROVIDER_SITE_OTHER): Payer: Medicare Other

## 2020-07-20 DIAGNOSIS — I442 Atrioventricular block, complete: Secondary | ICD-10-CM

## 2020-07-21 LAB — CUP PACEART REMOTE DEVICE CHECK
Battery Remaining Longevity: 97 mo
Battery Voltage: 2.99 V
Brady Statistic RV Percent Paced: 99.95 %
Date Time Interrogation Session: 20220713172243
Implantable Lead Implant Date: 20090402
Implantable Lead Location: 753860
Implantable Lead Model: 5076
Implantable Pulse Generator Implant Date: 20180919
Lead Channel Impedance Value: 361 Ohm
Lead Channel Impedance Value: 399 Ohm
Lead Channel Pacing Threshold Amplitude: 0.875 V
Lead Channel Pacing Threshold Pulse Width: 0.4 ms
Lead Channel Sensing Intrinsic Amplitude: 11.5 mV
Lead Channel Sensing Intrinsic Amplitude: 11.5 mV
Lead Channel Setting Pacing Amplitude: 2.5 V
Lead Channel Setting Pacing Pulse Width: 0.4 ms
Lead Channel Setting Sensing Sensitivity: 4 mV

## 2020-08-07 DIAGNOSIS — N1831 Chronic kidney disease, stage 3a: Secondary | ICD-10-CM | POA: Diagnosis not present

## 2020-08-07 DIAGNOSIS — E785 Hyperlipidemia, unspecified: Secondary | ICD-10-CM | POA: Diagnosis not present

## 2020-08-07 DIAGNOSIS — I129 Hypertensive chronic kidney disease with stage 1 through stage 4 chronic kidney disease, or unspecified chronic kidney disease: Secondary | ICD-10-CM | POA: Diagnosis not present

## 2020-08-12 NOTE — Progress Notes (Signed)
Remote pacemaker transmission.   

## 2020-09-07 DIAGNOSIS — N1831 Chronic kidney disease, stage 3a: Secondary | ICD-10-CM | POA: Diagnosis not present

## 2020-09-07 DIAGNOSIS — I129 Hypertensive chronic kidney disease with stage 1 through stage 4 chronic kidney disease, or unspecified chronic kidney disease: Secondary | ICD-10-CM | POA: Diagnosis not present

## 2020-09-07 DIAGNOSIS — E785 Hyperlipidemia, unspecified: Secondary | ICD-10-CM | POA: Diagnosis not present

## 2020-11-11 ENCOUNTER — Other Ambulatory Visit: Payer: Self-pay | Admitting: Neurology

## 2020-11-24 DIAGNOSIS — Z23 Encounter for immunization: Secondary | ICD-10-CM | POA: Diagnosis not present

## 2020-11-25 DIAGNOSIS — Z23 Encounter for immunization: Secondary | ICD-10-CM | POA: Diagnosis not present

## 2020-11-25 DIAGNOSIS — C44622 Squamous cell carcinoma of skin of right upper limb, including shoulder: Secondary | ICD-10-CM | POA: Diagnosis not present

## 2020-11-25 DIAGNOSIS — D0462 Carcinoma in situ of skin of left upper limb, including shoulder: Secondary | ICD-10-CM | POA: Diagnosis not present

## 2020-11-25 DIAGNOSIS — L570 Actinic keratosis: Secondary | ICD-10-CM | POA: Diagnosis not present

## 2020-11-25 DIAGNOSIS — D0439 Carcinoma in situ of skin of other parts of face: Secondary | ICD-10-CM | POA: Diagnosis not present

## 2020-11-25 DIAGNOSIS — D485 Neoplasm of uncertain behavior of skin: Secondary | ICD-10-CM | POA: Diagnosis not present

## 2020-12-05 DIAGNOSIS — M2550 Pain in unspecified joint: Secondary | ICD-10-CM | POA: Diagnosis not present

## 2020-12-05 DIAGNOSIS — M25562 Pain in left knee: Secondary | ICD-10-CM | POA: Diagnosis not present

## 2020-12-05 DIAGNOSIS — N1832 Chronic kidney disease, stage 3b: Secondary | ICD-10-CM | POA: Diagnosis not present

## 2020-12-05 DIAGNOSIS — M150 Primary generalized (osteo)arthritis: Secondary | ICD-10-CM | POA: Diagnosis not present

## 2020-12-05 DIAGNOSIS — M1A09X0 Idiopathic chronic gout, multiple sites, without tophus (tophi): Secondary | ICD-10-CM | POA: Diagnosis not present

## 2020-12-07 DIAGNOSIS — N1831 Chronic kidney disease, stage 3a: Secondary | ICD-10-CM | POA: Diagnosis not present

## 2020-12-07 DIAGNOSIS — I129 Hypertensive chronic kidney disease with stage 1 through stage 4 chronic kidney disease, or unspecified chronic kidney disease: Secondary | ICD-10-CM | POA: Diagnosis not present

## 2020-12-07 DIAGNOSIS — E785 Hyperlipidemia, unspecified: Secondary | ICD-10-CM | POA: Diagnosis not present

## 2020-12-23 DIAGNOSIS — Z23 Encounter for immunization: Secondary | ICD-10-CM | POA: Diagnosis not present

## 2020-12-23 DIAGNOSIS — L570 Actinic keratosis: Secondary | ICD-10-CM | POA: Diagnosis not present

## 2020-12-23 DIAGNOSIS — D485 Neoplasm of uncertain behavior of skin: Secondary | ICD-10-CM | POA: Diagnosis not present

## 2020-12-23 DIAGNOSIS — C44629 Squamous cell carcinoma of skin of left upper limb, including shoulder: Secondary | ICD-10-CM | POA: Diagnosis not present

## 2021-01-06 DIAGNOSIS — I129 Hypertensive chronic kidney disease with stage 1 through stage 4 chronic kidney disease, or unspecified chronic kidney disease: Secondary | ICD-10-CM | POA: Diagnosis not present

## 2021-01-06 DIAGNOSIS — E785 Hyperlipidemia, unspecified: Secondary | ICD-10-CM | POA: Diagnosis not present

## 2021-01-06 DIAGNOSIS — N1831 Chronic kidney disease, stage 3a: Secondary | ICD-10-CM | POA: Diagnosis not present

## 2021-01-18 ENCOUNTER — Ambulatory Visit: Payer: Medicare Other

## 2021-01-20 DIAGNOSIS — C44629 Squamous cell carcinoma of skin of left upper limb, including shoulder: Secondary | ICD-10-CM | POA: Diagnosis not present

## 2021-01-24 ENCOUNTER — Telehealth: Payer: Self-pay

## 2021-01-24 NOTE — Telephone Encounter (Signed)
The patient wife states they cannot find his monitor. I ordered the patient a monitor. He should receive it in 7-10 business days.

## 2021-02-01 ENCOUNTER — Ambulatory Visit (INDEPENDENT_AMBULATORY_CARE_PROVIDER_SITE_OTHER): Payer: Medicare Other

## 2021-02-01 DIAGNOSIS — I442 Atrioventricular block, complete: Secondary | ICD-10-CM

## 2021-02-01 LAB — CUP PACEART REMOTE DEVICE CHECK
Battery Remaining Longevity: 88 mo
Battery Voltage: 2.99 V
Brady Statistic RV Percent Paced: 99.88 %
Date Time Interrogation Session: 20230125152901
Implantable Lead Implant Date: 20090402
Implantable Lead Location: 753860
Implantable Lead Model: 5076
Implantable Pulse Generator Implant Date: 20180919
Lead Channel Impedance Value: 304 Ohm
Lead Channel Impedance Value: 342 Ohm
Lead Channel Pacing Threshold Amplitude: 1 V
Lead Channel Pacing Threshold Pulse Width: 0.4 ms
Lead Channel Sensing Intrinsic Amplitude: 11.5 mV
Lead Channel Sensing Intrinsic Amplitude: 11.5 mV
Lead Channel Setting Pacing Amplitude: 2.5 V
Lead Channel Setting Pacing Pulse Width: 0.4 ms
Lead Channel Setting Sensing Sensitivity: 4 mV

## 2021-02-06 DIAGNOSIS — C44622 Squamous cell carcinoma of skin of right upper limb, including shoulder: Secondary | ICD-10-CM | POA: Diagnosis not present

## 2021-02-10 NOTE — Progress Notes (Signed)
Remote pacemaker transmission.   

## 2021-02-10 NOTE — Progress Notes (Signed)
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 Flu vax- had -----DL given to CY.  Seems to be fighting the machine more and the machine is more loud.  This is disrupting his sleep pattern/cycle. We discussed replacement of old machine. He and wife also had questions about Inspire surgery which we discussed as alternative to CPAP.   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  We think his current machine is more than 86 years old so we will put in for replacement.  He is comfortable with CPAP and uses it every night.  Has an older machine at [facility].  No concerns. Has avoided COVID infection and denies problems with his breathing.  Cardiac status is stable, managed by cardiology.  ROS-see HPI              + = positive Constitutional:   No-   weight loss, night sweats, fevers, chills, fatigue, lassitude. HEENT:   No-  headaches, difficulty swallowing, tooth/dental problems, sore throat,       No-  sneezing, itching, ear ache,  +nasal congestion, post nasal drip,  CV:  No-   chest pain, orthopnea, PND, swelling in lower extremities, anasarca,                                                      dizziness, palpitations Resp: No-   shortness of breath with exertion or at rest.              productive cough,  No non-productive cough,  No- coughing up of blood.              No-   change in color of mucus.  No- wheezing.   Skin: No-   rash or lesions. GI:  No-   heartburn, indigestion, abdominal pain, nausea, vomiting, diarrhea,                 change in bowel habits, loss of appetite GU: No-   dysuria, change in color of urine, no urgency or frequency.  No- flank pain. MS:  No-   joint pain or swelling.  No- decreased range of motion.  No- back pain. Neuro-     nothing unusual Psych:  No- change in mood or affect. No depression or anxiety.  No memory loss.  OBJ- Physical Exam    Medium build, not obese General- Alert, Oriented,  Affect-appropriate, Distress- none acute Skin- rash-none, lesions- none, excoriation- none.  Lymphadenopathy- none Head- atraumatic            Eyes- Gross vision intact, PERRLA, conjunctivae and secretions clear            Ears- Hearing, canals-normal            Nose- Clear, no-Septal dev, mucus, polyps, erosion, + perforation             Throat- Mallampati II-III , mucosa clear , drainage- none, tonsils- atrophic Neck- flexible , trachea midline, no stridor , thyroid nl, carotid no bruit Chest - symmetrical excursion , unlabored           Heart/CV- RRR-feels regular , no murmur , no gallop  , no rub, nl s1 s2                           - JVD- none , edema- none, stasis changes- none, varices- none           Lung- clear to P&A, wheeze- none, cough- none , dullness-none, rub- none           Chest wall- +pacemaker R Abd- tender-no, distended-no, bowel sounds-present, HSM- no Br/ Gen/ Rectal- Not done, not indicated Extrem- cyanosis- none, clubbing, none, atrophy- none, strength- nl Neuro-  grossly intact to observation

## 2021-02-13 ENCOUNTER — Ambulatory Visit (INDEPENDENT_AMBULATORY_CARE_PROVIDER_SITE_OTHER): Payer: Medicare Other | Admitting: Internal Medicine

## 2021-02-13 ENCOUNTER — Encounter: Payer: Self-pay | Admitting: Internal Medicine

## 2021-02-13 ENCOUNTER — Other Ambulatory Visit: Payer: Self-pay

## 2021-02-13 VITALS — BP 116/72 | HR 85 | Temp 97.9°F | Ht 69.0 in | Wt 182.6 lb

## 2021-02-13 DIAGNOSIS — I4821 Permanent atrial fibrillation: Secondary | ICD-10-CM

## 2021-02-13 DIAGNOSIS — G4733 Obstructive sleep apnea (adult) (pediatric): Secondary | ICD-10-CM

## 2021-02-13 NOTE — Patient Instructions (Addendum)
Order- DME [facility]- please replace old CPAP machine auto 5-15, mask of choice, humidifier, supplies, AirView/ Card  Please call if we can help

## 2021-02-13 NOTE — Assessment & Plan Note (Signed)
Benefits from CPAP with good compliance.  Had been unable to replace old machine last year due to supply chain problems. Plan-replace old CPAP, continue 5-15

## 2021-02-13 NOTE — Addendum Note (Signed)
Addended by: Luanna Salk on: 02/13/2021 02:12 PM   Modules accepted: Orders

## 2021-02-13 NOTE — Assessment & Plan Note (Signed)
Pacemaker managed by cardiology

## 2021-02-21 DIAGNOSIS — L821 Other seborrheic keratosis: Secondary | ICD-10-CM | POA: Diagnosis not present

## 2021-02-21 DIAGNOSIS — L814 Other melanin hyperpigmentation: Secondary | ICD-10-CM | POA: Diagnosis not present

## 2021-02-21 DIAGNOSIS — S5012XA Contusion of left forearm, initial encounter: Secondary | ICD-10-CM | POA: Diagnosis not present

## 2021-02-21 DIAGNOSIS — D225 Melanocytic nevi of trunk: Secondary | ICD-10-CM | POA: Diagnosis not present

## 2021-02-21 DIAGNOSIS — L570 Actinic keratosis: Secondary | ICD-10-CM | POA: Diagnosis not present

## 2021-02-21 DIAGNOSIS — Z23 Encounter for immunization: Secondary | ICD-10-CM | POA: Diagnosis not present

## 2021-02-21 DIAGNOSIS — L578 Other skin changes due to chronic exposure to nonionizing radiation: Secondary | ICD-10-CM | POA: Diagnosis not present

## 2021-02-21 DIAGNOSIS — Z85828 Personal history of other malignant neoplasm of skin: Secondary | ICD-10-CM | POA: Diagnosis not present

## 2021-02-28 DIAGNOSIS — C44622 Squamous cell carcinoma of skin of right upper limb, including shoulder: Secondary | ICD-10-CM | POA: Diagnosis not present

## 2021-03-06 ENCOUNTER — Encounter: Payer: Self-pay | Admitting: Cardiovascular Disease

## 2021-03-06 ENCOUNTER — Other Ambulatory Visit: Payer: Self-pay

## 2021-03-06 ENCOUNTER — Ambulatory Visit (INDEPENDENT_AMBULATORY_CARE_PROVIDER_SITE_OTHER): Payer: Medicare Other | Admitting: Cardiovascular Disease

## 2021-03-06 VITALS — BP 110/68 | HR 68 | Ht 69.0 in | Wt 185.6 lb

## 2021-03-06 DIAGNOSIS — Z950 Presence of cardiac pacemaker: Secondary | ICD-10-CM | POA: Diagnosis not present

## 2021-03-06 DIAGNOSIS — I4821 Permanent atrial fibrillation: Secondary | ICD-10-CM | POA: Diagnosis not present

## 2021-03-06 DIAGNOSIS — I442 Atrioventricular block, complete: Secondary | ICD-10-CM

## 2021-03-06 DIAGNOSIS — E7800 Pure hypercholesterolemia, unspecified: Secondary | ICD-10-CM

## 2021-03-06 DIAGNOSIS — D6869 Other thrombophilia: Secondary | ICD-10-CM | POA: Diagnosis not present

## 2021-03-06 DIAGNOSIS — I10 Essential (primary) hypertension: Secondary | ICD-10-CM | POA: Diagnosis not present

## 2021-03-06 DIAGNOSIS — G4733 Obstructive sleep apnea (adult) (pediatric): Secondary | ICD-10-CM

## 2021-03-06 NOTE — Patient Instructions (Signed)
Medication Instructions:  No changes *If you need a refill on your cardiac medications before your next appointment, please call your pharmacy*   Lab Work: None ordered If you have labs (blood work) drawn today and your tests are completely normal, you will receive your results only by: MyChart Message (if you have MyChart) OR A paper copy in the mail If you have any lab test that is abnormal or we need to change your treatment, we will call you to review the results.   Testing/Procedures: None ordered   Follow-Up: At [facility], you and your health needs are our priority.  As part of our continuing mission to provide you with exceptional heart care, we have created designated Provider Care Teams.  These Care Teams include your primary Cardiologist (physician) and Advanced Practice Providers (APPs -  Physician Assistants and Nurse Practitioners) who all work together to provide you with the care you need, when you need it.  We recommend signing up for the patient portal called "MyChart".  Sign up information is provided on this After Visit Summary.  MyChart is used to connect with patients for Virtual Visits (Telemedicine).  Patients are able to view lab/test results, encounter notes, upcoming appointments, etc.  Non-urgent messages can be sent to your provider as well.   To learn more about what you can do with MyChart, go to [email or web address].    Your next appointment:   12 month(s)  The format for your next appointment:   In Person  Provider:   Dr. Croitoru   

## 2021-03-06 NOTE — Progress Notes (Signed)
Cardiology Office Note    Date:  03/09/2021   ID:  Dean Morgan, Dean Morgan, DOB Jul 11, 1931, MRN [id number]  PCP:  Dean Morgan., MD  Cardiologist:   Dean Klein, MD   Chief Complaint  Patient presents with   Pacemaker Check    History of Present Illness:  Dean Morgan, Dean Morgan is a 88 y.o. male with complete heart block and permanent atrial fibrillation here for pacemaker follow-up. He also has obstructive sleep apnea, hypertension and hyperlipidemia, but does not have known coronary disease or other structural heart problems.  He is accompanied by his wife.  He is doing well and does not have any cardiovascular complaints.  He had an upper respiratory infection in January 2023 and appendectomy July 2022  The patient specifically denies any chest pain at rest exertion, dyspnea at rest or with exertion, orthopnea, paroxysmal nocturnal dyspnea, syncope, palpitations, focal neurological deficits, intermittent claudication, lower extremity edema, unexplained weight gain, cough, hemoptysis or wheezing.  Memory remains so difficulty, but has not worsened significantly since his last appointment.  Blood pressure is well controlled.  Pacemaker interrogation shows normal device function.  His Medtronic Azure XT device implanted 2018 (original lead from 2009) has an estimated 7.3 years remaining longevity.  Lead parameters are excellent.  He has 99.8% ventricular paced rhythm and is pacemaker dependent.  His paced QRS is quite broad at 194 ms, QTc 504 ms    Past Medical History:  Diagnosis Date   Arthritis    Atrial fibrillation ([place])    Dysrhythmia    AF   Hyperlipemia    Hypertension    Memory difficulty 12/16/2017   Pacemaker    Pacemaker    Sinus bradycardia seen on cardiac monitor    Sleep apnea    does use his cpap   Wears glasses    Wears hearing aid     Past Surgical History:  Procedure Laterality Date   CARDIOVERSION     multiple times-pacer put in 2009    COLONOSCOPY     I & D EXTREMITY Left 04/20/2013   Procedure: LEFT HAND WOUND DEBRIDEMENT AND CLOSURE ;  Surgeon: Dean Nap, MD;  Location: [facility];  Service: Orthopedics;  Laterality: Left;   KNEE ARTHROSCOPY  2/10   left   LAPAROSCOPIC APPENDECTOMY N/A 07/14/2020   Procedure: APPENDECTOMY LAPAROSCOPIC;  Surgeon: Dean Riley, MD;  Location: [facility];  Service: General;  Laterality: N/A;   MASS EXCISION  01/29/2012   Procedure: EXCISION MASS;  Surgeon: Hessie Dibble, MD;  Location: [facility];  Service: Orthopedics;  Laterality: Left;  excision of bone spur left hand   METACARPAL OSTEOTOMY Left 01/13/2013   Procedure: LEFT INDEX FINGER MCP RADIAL COLLATERAL REPAIR/RECONSTRUCTION VS METACARPAL ROTATIONAL OSTEOTOMY;  Surgeon: Dean Nap, MD;  Location: [facility];  Service: Orthopedics;  Laterality: Left;   PACEMAKER INSERTION  2009   PPM GENERATOR CHANGEOUT N/A 09/26/2016   Procedure: PPM Generator Changeout;  Surgeon: Dean Klein, MD;  Location: [facility];  Service: Cardiovascular;  Laterality: N/A;   SHOULDER ARTHROSCOPY  2004   left   THUMB ARTHROSCOPY  2007   rt thunb mass   TONSILLECTOMY      Current Medications: Outpatient Medications Prior to Visit  Medication Sig Dispense Refill   allopurinol (ZYLOPRIM) 300 MG tablet Take 300 mg by mouth daily.     amLODipine (NORVASC) 5 MG tablet TAKE 1 TABLET BY MOUTH  EVERY DAY (Patient taking differently: Take 5 mg by mouth daily.) 90 tablet 3   apixaban (ELIQUIS) 5 MG TABS tablet Take 1 tablet (5 mg total) by mouth 2 (two) times daily.     Arginine 1000 MG TABS Take 1,000 mg by mouth daily.      aspirin EC 81 MG tablet Take 81 mg by mouth at bedtime as needed for mild pain.     chlorthalidone (HYGROTON) 25 MG tablet Take 0.5 tablets (12.5 mg total) by mouth daily. 45 tablet 3   Cholecalciferol (VITAMIN D3) 5000 UNITS TABS Take 5,000 Units by mouth daily.      colchicine  0.6 MG tablet Take 0.6 mg by mouth daily as needed (gout flares).     donepezil (ARICEPT) 10 MG tablet Take 1 tablet (10 mg total) by mouth every morning. 30 tablet 11   ezetimibe (ZETIA) 10 MG tablet Take 1 tablet (10 mg total) by mouth daily. (Patient taking differently: Take 10 mg by mouth daily with breakfast.) 28 tablet 0   fish oil-omega-3 fatty acids 1000 MG capsule Take 1 g by mouth 2 (two) times daily.      memantine (NAMENDA) 10 MG tablet Take 1 tablet (10 mg total) by mouth 2 (two) times daily. 60 tablet 11   Nebivolol HCl 20 MG TABS TAKE 2 TABLET BY MOUTH EVERY DAY (Patient taking differently: Take 20 mg by mouth in the morning and at bedtime.) 180 tablet 3   olmesartan (BENICAR) 40 MG tablet Take 40 mg by mouth daily.     potassium chloride SA (KLOR-CON) 20 MEQ tablet TAKE 1 TABLET BY MOUTH daily (Patient taking differently: Take 20 mEq by mouth daily.) 90 tablet 3   acetaminophen (TYLENOL) 325 MG tablet Take 650 mg by mouth every 6 (six) hours as needed for mild pain. (Patient not taking: Reported on 03/06/2021)     Glucosamine-Chondroitin (COSAMIN DS PO) Take 1 capsule by mouth 2 (two) times daily with a meal. (Patient not taking: Reported on 03/06/2021)     niacin (SLO-NIACIN) 500 MG tablet Take 500 mg by mouth 2 (two) times daily with a meal. (Patient not taking: Reported on 03/06/2021)     traMADol (ULTRAM) 50 MG tablet Take 1 tablet po q 8 hours pnr to use for moderate to severe pain-cauation sediation (Patient not taking: Reported on 03/06/2021)     No facility-administered medications prior to visit.     Allergies:   Lipitor [atorvastatin], Penicillins, and Vytorin [ezetimibe-simvastatin]   Social History   Socioeconomic History   Marital status: Married    Spouse name: Dean Morgan   Number of children: 2   Years of education: Dean Morgan   Highest education level: Not on file  Occupational History   Occupation: Dean Morgan-Retired  Tobacco Use   Smoking status: Never   Smokeless  tobacco: Never  Vaping Use   Vaping Use: Never used  Substance and Sexual Activity   Alcohol use: Yes    Alcohol/week: 0.0 standard drinks    Comment: wine 3-4 times a week   Drug use: No   Sexual activity: Not on file  Other Topics Concern   Not on file  Social History Narrative   Lives with wife   Right handed    Caffeine use: 1-2 cups coffee per day   Social Determinants of Health   Financial Resource Strain: Not on file  Food Insecurity: Not on file  Transportation Needs: Not on file  Physical Activity: Not on file  Stress: Not on file  Social Connections: Not on file     Family History:  The patient's family history includes Dementia in his mother; Diabetes in his brother and maternal grandmother; Emphysema in an other family member; Heart disease in an other family member; Hypertension in his brother; Stroke in his father and paternal grandfather.   ROS:   Please see the history of present illness.   All other systems are reviewed and are negative.   PHYSICAL EXAM:   VS:  BP 110/68    Pulse 68    Ht 5\' 9"  (1.753 m)    Wt 185 lb 9.6 oz (84.2 kg)    SpO2 99%    BMI 27.41 kg/m     General: Alert, oriented x3, no distress, mildly overweight.  Healthy left subclavian pacemaker site Head: no evidence of trauma, PERRL, EOMI, no exophtalmos or lid lag, no myxedema, no xanthelasma; normal ears, nose and oropharynx Neck: normal jugular venous pulsations and no hepatojugular reflux; brisk carotid pulses without delay and no carotid bruits Chest: clear to auscultation, no signs of consolidation by percussion or palpation, normal fremitus, symmetrical and full respiratory excursions Cardiovascular: normal position and quality of the apical impulse, regular rhythm, normal first and paradoxically split second heart sounds, no murmurs, rubs or gallops Abdomen: no tenderness or distention, no masses by palpation, no abnormal pulsatility or arterial bruits, normal bowel sounds, no  hepatosplenomegaly Extremities: no clubbing, cyanosis or edema; 2+ radial, ulnar and brachial pulses bilaterally; 2+ right femoral, posterior tibial and dorsalis pedis pulses; 2+ left femoral, posterior tibial and dorsalis pedis pulses; no subclavian or femoral bruits Neurological: grossly nonfocal Psych: Normal mood and affect    Wt Readings from Last 3 Encounters:  03/06/21 185 lb 9.6 oz (84.2 kg)  02/13/21 182 lb 9.6 oz (82.8 kg)  07/14/20 179 lb 14.3 oz (81.6 kg)     Studies/Labs Reviewed:   EKG:  EKG is not ordered today.  It shows background atrial fibrillation 100% ventricular paced rhythm Lipid Panel    Component Value Date/Time   CHOL 164 06/10/2012 0815   TRIG 94 06/10/2012 0815   HDL 64 06/10/2012 0815   CHOLHDL 2.6 06/10/2012 0815   VLDL 19 06/10/2012 0815   LDLCALC 81 06/10/2012 0815   03/18/2018 Chol 207, HDL 52, LDL 125, TG 150 Creat 1.5, Hgb 13.8  03/25/2019 Total cholesterol 206, HDL 53, LDL 123, triglycerides 152 TSH 3.65, potassium 3.9  10/19/2019 Hemoglobin 12.5, creatinine 1.38, normal liver function tests  04/05/2020 Cholesterol 212, HDL 48, LDL 142, triglycerides 112 TSH 3.69  12/05/2020 creatinine 1.32, hemoglobin 14.0, ALT 11  ASSESSMENT:    1. Complete heart block ([place])   2. Atrial fibrillation, permanent ([place])   3. Acquired thrombophilia ([place])   4. Pacemaker   5. OSA (obstructive sleep apnea)   6. Pure hypercholesterolemia   7. Essential hypertension       PLAN:  In order of problems listed above:  AFib: Permanent arrhythmia.  Rate control is not an issue due to complete heart block.  CHADSVasc 3-5 (age 30, HTN, questionable TIA in 2002).  Anticoagulation: Compliant with anticoagulant without bleeding complications. CHB: Pacemaker dependent.  Seems to have some improvement in activity level based on pacemaker tracking PPM: Normal device function.  Original leads from 2009, current generator from 2018.  Continue modalities every 3  months. OSA: Reports 100% compliance with CPAP and denies daytime hypersomnolence HLP: Not receiving aggressive treatment since he does not have CAD or  PAD and is intolerant to statins due to myopathy.  On Zetia monotherapy. HTN: Blood pressure is in normal range after we reduced the dose of his diuretic at his last appointment.    Medication Adjustments/Labs and Tests Ordered: Current medicines are reviewed at length with the patient today.  Concerns regarding medicines are outlined above.  Medication changes, Labs and Tests ordered today are listed in the Patient Instructions below. Patient Instructions  Medication Instructions:  No changes *If you need a refill on your cardiac medications before your next appointment, please call your pharmacy*   Lab Work: None ordered If you have labs (blood work) drawn today and your tests are completely normal, you will receive your results only by: [facility] (if you have MyChart) OR A paper copy in the mail If you have any lab test that is abnormal or we need to change your treatment, we will call you to review the results.   Testing/Procedures: None ordered   Follow-Up: At [facility], you and your health needs are our priority.  As part of our continuing mission to provide you with exceptional heart care, we have created designated Provider Care Teams.  These Care Teams include your primary Cardiologist (physician) and Advanced Practice Providers (APPs -  Physician Assistants and Nurse Practitioners) who all work together to provide you with the care you need, when you need it.  We recommend signing up for the patient portal called "MyChart".  Sign up information is provided on this After Visit Summary.  MyChart is used to connect with patients for Virtual Visits (Telemedicine).  Patients are able to view lab/test results, encounter notes, upcoming appointments, etc.  Non-urgent messages can be sent to your provider as well.   To  learn more about what you can do with MyChart, go to [email or web address].    Your next appointment:   12 month(s)  The format for your next appointment:   In Person  Provider:   Dr. Sallyanne Kuster    Signed, Dean Klein, MD  03/09/2021 4:37 PM    [address], [place], [place]  [address] Phone: [phone]; Fax: [phone]

## 2021-03-09 ENCOUNTER — Encounter: Payer: Self-pay | Admitting: Cardiovascular Disease

## 2021-04-18 ENCOUNTER — Other Ambulatory Visit: Payer: Self-pay | Admitting: Cardiovascular Disease

## 2021-05-03 ENCOUNTER — Ambulatory Visit (INDEPENDENT_AMBULATORY_CARE_PROVIDER_SITE_OTHER): Payer: Medicare Other

## 2021-05-03 DIAGNOSIS — I442 Atrioventricular block, complete: Secondary | ICD-10-CM

## 2021-05-03 LAB — CUP PACEART REMOTE DEVICE CHECK
Battery Remaining Longevity: 86 mo
Battery Voltage: 2.98 V
Brady Statistic RV Percent Paced: 99.44 %
Date Time Interrogation Session: 20230425234252
Implantable Lead Implant Date: 20090402
Implantable Lead Location: 753860
Implantable Lead Model: 5076
Implantable Pulse Generator Implant Date: 20180919
Lead Channel Impedance Value: 323 Ohm
Lead Channel Impedance Value: 361 Ohm
Lead Channel Pacing Threshold Amplitude: 0.875 V
Lead Channel Pacing Threshold Pulse Width: 0.4 ms
Lead Channel Sensing Intrinsic Amplitude: 11.5 mV
Lead Channel Sensing Intrinsic Amplitude: 11.5 mV
Lead Channel Setting Pacing Amplitude: 2.5 V
Lead Channel Setting Pacing Pulse Width: 0.4 ms
Lead Channel Setting Sensing Sensitivity: 4 mV

## 2021-05-04 DIAGNOSIS — Z961 Presence of intraocular lens: Secondary | ICD-10-CM | POA: Diagnosis not present

## 2021-05-09 DIAGNOSIS — I10 Essential (primary) hypertension: Secondary | ICD-10-CM | POA: Diagnosis not present

## 2021-05-09 DIAGNOSIS — E291 Testicular hypofunction: Secondary | ICD-10-CM | POA: Diagnosis not present

## 2021-05-09 DIAGNOSIS — M109 Gout, unspecified: Secondary | ICD-10-CM | POA: Diagnosis not present

## 2021-05-09 DIAGNOSIS — Z125 Encounter for screening for malignant neoplasm of prostate: Secondary | ICD-10-CM | POA: Diagnosis not present

## 2021-05-09 DIAGNOSIS — E785 Hyperlipidemia, unspecified: Secondary | ICD-10-CM | POA: Diagnosis not present

## 2021-05-12 DIAGNOSIS — E785 Hyperlipidemia, unspecified: Secondary | ICD-10-CM | POA: Diagnosis not present

## 2021-05-12 DIAGNOSIS — F0390 Unspecified dementia without behavioral disturbance: Secondary | ICD-10-CM | POA: Diagnosis not present

## 2021-05-12 DIAGNOSIS — Z1339 Encounter for screening examination for other mental health and behavioral disorders: Secondary | ICD-10-CM | POA: Diagnosis not present

## 2021-05-12 DIAGNOSIS — Z7901 Long term (current) use of anticoagulants: Secondary | ICD-10-CM | POA: Diagnosis not present

## 2021-05-12 DIAGNOSIS — Z0000 Encounter for general adult medical examination without abnormal findings: Secondary | ICD-10-CM | POA: Diagnosis not present

## 2021-05-12 DIAGNOSIS — I129 Hypertensive chronic kidney disease with stage 1 through stage 4 chronic kidney disease, or unspecified chronic kidney disease: Secondary | ICD-10-CM | POA: Diagnosis not present

## 2021-05-12 DIAGNOSIS — Z1331 Encounter for screening for depression: Secondary | ICD-10-CM | POA: Diagnosis not present

## 2021-05-12 DIAGNOSIS — I10 Essential (primary) hypertension: Secondary | ICD-10-CM | POA: Diagnosis not present

## 2021-05-12 DIAGNOSIS — E669 Obesity, unspecified: Secondary | ICD-10-CM | POA: Diagnosis not present

## 2021-05-12 DIAGNOSIS — R82998 Other abnormal findings in urine: Secondary | ICD-10-CM | POA: Diagnosis not present

## 2021-05-12 DIAGNOSIS — I480 Paroxysmal atrial fibrillation: Secondary | ICD-10-CM | POA: Diagnosis not present

## 2021-05-12 DIAGNOSIS — N1831 Chronic kidney disease, stage 3a: Secondary | ICD-10-CM | POA: Diagnosis not present

## 2021-05-12 DIAGNOSIS — D6869 Other thrombophilia: Secondary | ICD-10-CM | POA: Diagnosis not present

## 2021-05-16 DIAGNOSIS — Z6829 Body mass index (BMI) 29.0-29.9, adult: Secondary | ICD-10-CM | POA: Diagnosis not present

## 2021-05-16 DIAGNOSIS — N1832 Chronic kidney disease, stage 3b: Secondary | ICD-10-CM | POA: Diagnosis not present

## 2021-05-16 DIAGNOSIS — E663 Overweight: Secondary | ICD-10-CM | POA: Diagnosis not present

## 2021-05-16 DIAGNOSIS — M1991 Primary osteoarthritis, unspecified site: Secondary | ICD-10-CM | POA: Diagnosis not present

## 2021-05-16 DIAGNOSIS — M1A09X0 Idiopathic chronic gout, multiple sites, without tophus (tophi): Secondary | ICD-10-CM | POA: Diagnosis not present

## 2021-05-16 DIAGNOSIS — M25562 Pain in left knee: Secondary | ICD-10-CM | POA: Diagnosis not present

## 2021-05-18 NOTE — Progress Notes (Signed)
Remote pacemaker transmission.   

## 2021-06-07 DIAGNOSIS — I480 Paroxysmal atrial fibrillation: Secondary | ICD-10-CM | POA: Diagnosis not present

## 2021-06-07 DIAGNOSIS — E785 Hyperlipidemia, unspecified: Secondary | ICD-10-CM | POA: Diagnosis not present

## 2021-06-07 DIAGNOSIS — I10 Essential (primary) hypertension: Secondary | ICD-10-CM | POA: Diagnosis not present

## 2021-06-12 ENCOUNTER — Other Ambulatory Visit: Payer: Self-pay | Admitting: Cardiovascular Disease

## 2021-07-07 DIAGNOSIS — E785 Hyperlipidemia, unspecified: Secondary | ICD-10-CM | POA: Diagnosis not present

## 2021-07-07 DIAGNOSIS — I10 Essential (primary) hypertension: Secondary | ICD-10-CM | POA: Diagnosis not present

## 2021-07-07 DIAGNOSIS — I480 Paroxysmal atrial fibrillation: Secondary | ICD-10-CM | POA: Diagnosis not present

## 2021-08-02 ENCOUNTER — Ambulatory Visit (INDEPENDENT_AMBULATORY_CARE_PROVIDER_SITE_OTHER): Payer: Medicare Other

## 2021-08-02 DIAGNOSIS — I442 Atrioventricular block, complete: Secondary | ICD-10-CM

## 2021-08-02 LAB — CUP PACEART REMOTE DEVICE CHECK
Battery Remaining Longevity: 83 mo
Battery Voltage: 2.98 V
Brady Statistic RV Percent Paced: 99.89 %
Date Time Interrogation Session: 20230726015141
Implantable Lead Implant Date: 20090402
Implantable Lead Location: 753860
Implantable Lead Model: 5076
Implantable Pulse Generator Implant Date: 20180919
Lead Channel Impedance Value: 342 Ohm
Lead Channel Impedance Value: 361 Ohm
Lead Channel Pacing Threshold Amplitude: 0.875 V
Lead Channel Pacing Threshold Pulse Width: 0.4 ms
Lead Channel Sensing Intrinsic Amplitude: 11.5 mV
Lead Channel Sensing Intrinsic Amplitude: 11.5 mV
Lead Channel Setting Pacing Amplitude: 2.5 V
Lead Channel Setting Pacing Pulse Width: 0.4 ms
Lead Channel Setting Sensing Sensitivity: 4 mV

## 2021-08-25 NOTE — Progress Notes (Signed)
Remote pacemaker transmission.   

## 2021-10-24 DIAGNOSIS — Z23 Encounter for immunization: Secondary | ICD-10-CM | POA: Diagnosis not present

## 2021-11-01 ENCOUNTER — Ambulatory Visit (INDEPENDENT_AMBULATORY_CARE_PROVIDER_SITE_OTHER): Payer: Medicare Other

## 2021-11-01 DIAGNOSIS — I442 Atrioventricular block, complete: Secondary | ICD-10-CM

## 2021-11-02 LAB — CUP PACEART REMOTE DEVICE CHECK
Battery Remaining Longevity: 80 mo
Battery Voltage: 2.98 V
Brady Statistic RV Percent Paced: 99.84 %
Date Time Interrogation Session: 20231025012112
Implantable Lead Connection Status: 753985
Implantable Lead Implant Date: 20090402
Implantable Lead Location: 753860
Implantable Lead Model: 5076
Implantable Pulse Generator Implant Date: 20180919
Lead Channel Impedance Value: 323 Ohm
Lead Channel Impedance Value: 361 Ohm
Lead Channel Pacing Threshold Amplitude: 1.125 V
Lead Channel Pacing Threshold Pulse Width: 0.4 ms
Lead Channel Sensing Intrinsic Amplitude: 6 mV
Lead Channel Sensing Intrinsic Amplitude: 6 mV
Lead Channel Setting Pacing Amplitude: 2.5 V
Lead Channel Setting Pacing Pulse Width: 0.4 ms
Lead Channel Setting Sensing Sensitivity: 4 mV
Zone Setting Status: 755011

## 2021-11-14 NOTE — Progress Notes (Signed)
Remote pacemaker transmission.   

## 2022-01-31 ENCOUNTER — Ambulatory Visit: Payer: Medicare Other | Attending: Cardiovascular Disease

## 2022-01-31 DIAGNOSIS — I442 Atrioventricular block, complete: Secondary | ICD-10-CM | POA: Diagnosis not present

## 2022-01-31 LAB — CUP PACEART REMOTE DEVICE CHECK
Battery Remaining Longevity: 76 mo
Battery Voltage: 2.97 V
Brady Statistic RV Percent Paced: 99.93 %
Date Time Interrogation Session: 20240124055956
Implantable Lead Connection Status: 753985
Implantable Lead Implant Date: 20090402
Implantable Lead Location: 753860
Implantable Lead Model: 5076
Implantable Pulse Generator Implant Date: 20180919
Lead Channel Impedance Value: 323 Ohm
Lead Channel Impedance Value: 361 Ohm
Lead Channel Pacing Threshold Amplitude: 0.875 V
Lead Channel Pacing Threshold Pulse Width: 0.4 ms
Lead Channel Sensing Intrinsic Amplitude: 6 mV
Lead Channel Sensing Intrinsic Amplitude: 6 mV
Lead Channel Setting Pacing Amplitude: 2.5 V
Lead Channel Setting Pacing Pulse Width: 0.4 ms
Lead Channel Setting Sensing Sensitivity: 4 mV
Zone Setting Status: 755011

## 2022-02-07 DIAGNOSIS — M1991 Primary osteoarthritis, unspecified site: Secondary | ICD-10-CM | POA: Diagnosis not present

## 2022-02-07 DIAGNOSIS — N1832 Chronic kidney disease, stage 3b: Secondary | ICD-10-CM | POA: Diagnosis not present

## 2022-02-07 DIAGNOSIS — Z6831 Body mass index (BMI) 31.0-31.9, adult: Secondary | ICD-10-CM | POA: Diagnosis not present

## 2022-02-07 DIAGNOSIS — M1A09X0 Idiopathic chronic gout, multiple sites, without tophus (tophi): Secondary | ICD-10-CM | POA: Diagnosis not present

## 2022-02-07 DIAGNOSIS — M25561 Pain in right knee: Secondary | ICD-10-CM | POA: Diagnosis not present

## 2022-02-07 DIAGNOSIS — E669 Obesity, unspecified: Secondary | ICD-10-CM | POA: Diagnosis not present

## 2022-02-07 DIAGNOSIS — M25562 Pain in left knee: Secondary | ICD-10-CM | POA: Diagnosis not present

## 2022-02-11 NOTE — Progress Notes (Unsigned)
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  We think his current machine is more than 87 years old so we will put in for replacement.  He is comfortable with CPAP and uses it every night.  Has an older machine at [facility].  No concerns. Has avoided COVID infection and denies problems with his breathing.  Cardiac status is stable, managed by cardiology.  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 HEENT:   No-  headaches, difficulty swallowing, tooth/dental problems, sore throat,       No-  sneezing, itching, ear ache, +nasal congestion, post nasal drip,  CV:  No-   chest pain, orthopnea, PND, swelling in lower extremities, anasarca,                                                      dizziness, palpitations Resp: No-   shortness of breath with exertion or at rest.              productive cough,  No non-productive cough,  No-  coughing up of blood.              No-   change in color of mucus.  No- wheezing.   Skin: No-   rash or lesions. GI:  No-   heartburn, indigestion, abdominal pain, nausea, vomiting, diarrhea,                 change in bowel habits, loss of appetite GU: No-   dysuria, change in color of urine, no urgency or frequency.  No- flank pain. MS:  No-   joint pain or swelling.  No- decreased range of motion.  No- back pain. Neuro-     nothing unusual Psych:  No- change in mood or affect. No depression or anxiety.  No memory loss.  OBJ- Physical Exam    Medium build, not obese General- Alert, Oriented, Affect-appropriate, Distress- none acute Skin- rash-none, lesions- none, excoriation- none.  Lymphadenopathy- none Head- atraumatic            Eyes- Gross vision intact, PERRLA, conjunctivae and secretions clear  Ears- Hearing, canals-normal            Nose- Clear, no-Septal dev, mucus, polyps, erosion, + perforation             Throat- Mallampati II-III , mucosa clear , drainage- none, tonsils- atrophic Neck- flexible , trachea midline, no stridor , thyroid nl, carotid no bruit Chest - symmetrical excursion , unlabored           Heart/CV- RRR-feels regular , no murmur , no gallop  , no rub, nl s1 s2                           - JVD- none , edema- none, stasis changes- none, varices- none           Lung- clear to P&A, wheeze- none, cough- none , dullness-none, rub- none           Chest wall- +pacemaker R Abd- tender-no, distended-no, bowel sounds-present, HSM- no Br/ Gen/ Rectal- Not done, not indicated Extrem- cyanosis- none, clubbing, none, atrophy- none, strength- nl Neuro- grossly intact to observation

## 2022-02-13 ENCOUNTER — Encounter: Payer: Self-pay | Admitting: Internal Medicine

## 2022-02-13 ENCOUNTER — Ambulatory Visit (INDEPENDENT_AMBULATORY_CARE_PROVIDER_SITE_OTHER): Payer: Medicare Other | Admitting: Internal Medicine

## 2022-02-13 VITALS — BP 122/78 | HR 87 | Ht 69.0 in | Wt 196.8 lb

## 2022-02-13 DIAGNOSIS — G4733 Obstructive sleep apnea (adult) (pediatric): Secondary | ICD-10-CM | POA: Diagnosis not present

## 2022-02-13 DIAGNOSIS — I4821 Permanent atrial fibrillation: Secondary | ICD-10-CM

## 2022-02-13 NOTE — Assessment & Plan Note (Signed)
From CPAP with very good compliance and control. Plan-continue auto 5-15

## 2022-02-13 NOTE — Assessment & Plan Note (Signed)
He reports a cardiology continues to follow for his pacemaker with history of atrial fibrillation.  No new concerns reported.

## 2022-02-13 NOTE — Patient Instructions (Signed)
We can continue CPAP auto 5-15 ° °Please call if we can help °

## 2022-02-21 NOTE — Progress Notes (Signed)
Remote pacemaker transmission.   

## 2022-02-27 DIAGNOSIS — Z85828 Personal history of other malignant neoplasm of skin: Secondary | ICD-10-CM | POA: Diagnosis not present

## 2022-02-27 DIAGNOSIS — D225 Melanocytic nevi of trunk: Secondary | ICD-10-CM | POA: Diagnosis not present

## 2022-02-27 DIAGNOSIS — L821 Other seborrheic keratosis: Secondary | ICD-10-CM | POA: Diagnosis not present

## 2022-02-27 DIAGNOSIS — L570 Actinic keratosis: Secondary | ICD-10-CM | POA: Diagnosis not present

## 2022-02-27 DIAGNOSIS — D485 Neoplasm of uncertain behavior of skin: Secondary | ICD-10-CM | POA: Diagnosis not present

## 2022-02-27 DIAGNOSIS — L814 Other melanin hyperpigmentation: Secondary | ICD-10-CM | POA: Diagnosis not present

## 2022-02-27 DIAGNOSIS — L578 Other skin changes due to chronic exposure to nonionizing radiation: Secondary | ICD-10-CM | POA: Diagnosis not present

## 2022-04-02 ENCOUNTER — Encounter: Payer: Self-pay | Admitting: Cardiovascular Disease

## 2022-04-02 ENCOUNTER — Ambulatory Visit: Payer: Medicare Other | Attending: Cardiovascular Disease | Admitting: Cardiovascular Disease

## 2022-04-02 VITALS — BP 122/64 | HR 82 | Ht 68.0 in | Wt 196.8 lb

## 2022-04-02 DIAGNOSIS — I4821 Permanent atrial fibrillation: Secondary | ICD-10-CM | POA: Insufficient documentation

## 2022-04-02 DIAGNOSIS — I10 Essential (primary) hypertension: Secondary | ICD-10-CM | POA: Insufficient documentation

## 2022-04-02 DIAGNOSIS — E7800 Pure hypercholesterolemia, unspecified: Secondary | ICD-10-CM

## 2022-04-02 DIAGNOSIS — G4733 Obstructive sleep apnea (adult) (pediatric): Secondary | ICD-10-CM | POA: Diagnosis not present

## 2022-04-02 DIAGNOSIS — Z950 Presence of cardiac pacemaker: Secondary | ICD-10-CM

## 2022-04-02 DIAGNOSIS — I442 Atrioventricular block, complete: Secondary | ICD-10-CM | POA: Diagnosis not present

## 2022-04-02 DIAGNOSIS — D6869 Other thrombophilia: Secondary | ICD-10-CM

## 2022-04-02 NOTE — Progress Notes (Unsigned)
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 He also has obstructive sleep apnea, hypertension and hyperlipidemia, but does not have known coronary disease or other structural heart problems.  He is accompanied by his wife.  He has no cardiovascular complaints but his short-term memory is "gone".  She takes him with her "everywhere she goes".  He has had some issues with sundowning, especially if he is away from home.  For the most part though he is quite content and comfortable.  The patient specifically denies any chest pain at rest exertion, dyspnea at rest or with exertion, orthopnea, paroxysmal nocturnal dyspnea, syncope, palpitations, focal neurological deficits, intermittent claudication, lower extremity edema, unexplained weight gain, cough, hemoptysis or wheezing.   Pacemaker interrogation shows normal device function.  His Medtronic Azure XT device implanted 2018 (original lead from 2009) has an estimated 6 years remaining longevity.  Lead parameters are excellent.  He has 99.8 % ventricular paced rhythm and is pacemaker dependent.  His paced QRS is quite broad at 178 ms, QTc 506 ms.  He has not had any episodes of high ventricular rate.    Past Medical History:  Diagnosis Date   Arthritis    Atrial fibrillation ([place])    Dysrhythmia    AF   Hyperlipemia    Hypertension    Memory difficulty 12/16/2017   Pacemaker    Pacemaker    Sinus bradycardia seen on cardiac monitor    Sleep apnea    does use his cpap   Wears glasses    Wears hearing aid     Past Surgical History:  Procedure Laterality Date    CARDIOVERSION     multiple times-pacer put in 2009   COLONOSCOPY     I & D EXTREMITY Left 04/20/2013   Procedure: LEFT HAND WOUND DEBRIDEMENT AND CLOSURE ;  Surgeon: Jolyn Nap, MD;  Location: [facility];  Service: Orthopedics;  Laterality: Left;   KNEE ARTHROSCOPY  2/10   left   LAPAROSCOPIC APPENDECTOMY N/A 07/14/2020   Procedure: APPENDECTOMY LAPAROSCOPIC;  Surgeon: Clovis Riley, MD;  Location: [facility];  Service: General;  Laterality: N/A;   MASS EXCISION  01/29/2012   Procedure: EXCISION MASS;  Surgeon: Hessie Dibble, MD;  Location: [facility];  Service: Orthopedics;  Laterality: Left;  excision of bone spur left hand   METACARPAL OSTEOTOMY Left 01/13/2013   Procedure: LEFT INDEX FINGER MCP RADIAL COLLATERAL REPAIR/RECONSTRUCTION VS METACARPAL ROTATIONAL OSTEOTOMY;  Surgeon: Jolyn Nap, MD;  Location: [facility];  Service: Orthopedics;  Laterality: Left;   PACEMAKER INSERTION  2009   PPM GENERATOR CHANGEOUT N/A 09/26/2016   Procedure: PPM Generator Changeout;  Surgeon: Dean Klein, MD;  Location: [facility];  Service: Cardiovascular;  Laterality: N/A;   SHOULDER ARTHROSCOPY  2004   left   THUMB ARTHROSCOPY  2007   rt thunb mass   TONSILLECTOMY      Current Medications: Outpatient Medications Prior to Visit  Medication Sig Dispense Refill   acetaminophen (TYLENOL) 325 MG tablet Take  650 mg by mouth every 6 (six) hours as needed for mild pain.     allopurinol (ZYLOPRIM) 300 MG tablet Take 300 mg by mouth daily.     amLODipine (NORVASC) 5 MG tablet TAKE 1 TABLET BY MOUTH EVERY DAY (Patient taking differently: Take 5 mg by mouth daily.) 90 tablet 3   apixaban (ELIQUIS) 5 MG TABS tablet Take 1 tablet (5 mg total) by mouth 2 (two) times daily.     Arginine 1000 MG TABS Take 1,000 mg by mouth daily.      aspirin EC 81 MG tablet Take 81 mg by mouth at bedtime as needed for mild pain.     chlorthalidone (HYGROTON) 25 MG  tablet TAKE 1 TABLET BY MOUTH EVERY DAY 90 tablet 3   Cholecalciferol (VITAMIN D3) 5000 UNITS TABS Take 5,000 Units by mouth daily.      colchicine 0.6 MG tablet Take 0.6 mg by mouth daily as needed (gout flares).     donepezil (ARICEPT) 10 MG tablet Take 1 tablet (10 mg total) by mouth every morning. 30 tablet 11   ezetimibe (ZETIA) 10 MG tablet Take 1 tablet (10 mg total) by mouth daily. (Patient taking differently: Take 10 mg by mouth daily with breakfast.) 28 tablet 0   fish oil-omega-3 fatty acids 1000 MG capsule Take 1 g by mouth 2 (two) times daily.      Glucosamine-Chondroitin (COSAMIN DS PO) Take 1 capsule by mouth 2 (two) times daily with a meal.     memantine (NAMENDA) 10 MG tablet Take 1 tablet (10 mg total) by mouth 2 (two) times daily. 60 tablet 11   Nebivolol HCl 20 MG TABS TAKE 2 TABLETS BY MOUTH EVERY DAY 180 tablet 3   niacin (SLO-NIACIN) 500 MG tablet Take 500 mg by mouth 2 (two) times daily with a meal.     olmesartan (BENICAR) 40 MG tablet Take 40 mg by mouth daily.     potassium chloride SA (KLOR-CON M) 20 MEQ tablet TAKE 1 TABLET BY MOUTH DAILY 90 tablet 3   traMADol (ULTRAM) 50 MG tablet      No facility-administered medications prior to visit.     Allergies:   Lipitor [atorvastatin], Penicillins, and Vytorin [ezetimibe-simvastatin]   Social History   Socioeconomic History   Marital status: Married    Spouse name: Dean Morgan   Number of children: 2   Years of education: [name]   Highest education level: Not on file  Occupational History   Occupation: [name]-Retired  Tobacco Use   Smoking status: Never   Smokeless tobacco: Never  Vaping Use   Vaping Use: Never used  Substance and Sexual Activity   Alcohol use: Yes    Alcohol/week: 0.0 standard drinks of alcohol    Comment: wine 3-4 times a week   Drug use: No   Sexual activity: Not on file  Other Topics Concern   Not on file  Social History Narrative   Lives with wife   Right handed    Caffeine use: 1-2  cups coffee per day   Social Determinants of Health   Financial Resource Strain: Not on file  Food Insecurity: Not on file  Transportation Needs: Not on file  Physical Activity: Not on file  Stress: Not on file  Social Connections: Not on file     Family History:  The patient's family history includes Dementia in his mother; Diabetes in his brother and maternal grandmother; Emphysema in an other family member; Heart disease in  an other family member; Hypertension in his brother; Stroke in his father and paternal grandfather.   ROS:   Please see the history of present illness.   All other systems are reviewed and are negative.   PHYSICAL EXAM:   VS:  BP 122/64   Pulse 82   Ht 5\' 8"  (1.727 m)   Wt 196 lb 12.8 oz (89.3 kg)   SpO2 96%   BMI 29.92 kg/m      General: Alert, oriented x3, no distress, healthy subclavian pacemaker site Head: no evidence of trauma, PERRL, EOMI, no exophtalmos or lid lag, no myxedema, no xanthelasma; normal ears, nose and oropharynx Neck: normal jugular venous pulsations and no hepatojugular reflux; brisk carotid pulses without delay and no carotid bruits Chest: clear to auscultation, no signs of consolidation by percussion or palpation, normal fremitus, symmetrical and full respiratory excursions Cardiovascular: normal position and quality of the apical impulse, regular rhythm, normal first and paradoxically split second heart sounds, no murmurs, rubs or gallops Abdomen: no tenderness or distention, no masses by palpation, no abnormal pulsatility or arterial bruits, normal bowel sounds, no hepatosplenomegaly Extremities: no clubbing, cyanosis or edema; 2+ radial, ulnar and brachial pulses bilaterally; 2+ right femoral, posterior tibial and dorsalis pedis pulses; 2+ left femoral, posterior tibial and dorsalis pedis pulses; no subclavian or femoral bruits Neurological: grossly nonfocal Psych: Normal mood and affect     Wt Readings from Last 3 Encounters:   04/02/22 196 lb 12.8 oz (89.3 kg)  02/13/22 196 lb 12.8 oz (89.3 kg)  03/06/21 185 lb 9.6 oz (84.2 kg)     Studies/Labs Reviewed:   EKG:  EKG is ordered today and shows atrial fibrillation with 100% ventricular pacing.  QRS is broad at 178 ms.  QTc 406 ms Lipid Panel    Component Value Date/Time   CHOL 164 06/10/2012 0815   TRIG 94 06/10/2012 0815   HDL 64 06/10/2012 0815   CHOLHDL 2.6 06/10/2012 0815   VLDL 19 06/10/2012 0815   LDLCALC 81 06/10/2012 0815   03/18/2018 Chol 207, HDL 52, LDL 125, TG 150 Creat 1.5, Hgb 13.8  03/25/2019 Total cholesterol 206, HDL 53, LDL 123, triglycerides 152 TSH 3.65, potassium 3.9  10/19/2019 Hemoglobin 12.5, creatinine 1.38, normal liver function tests  04/05/2020 Cholesterol 212, HDL 48, LDL 142, triglycerides 112 TSH 3.69  12/05/2020 creatinine 1.32, hemoglobin 14.0, ALT 11  05/09/2021 Cholesterol 203, HDL 44, LDL 133, triglycerides 131 ALT 16, TSH 4.83, potassium 3.8  ASSESSMENT:    1. Atrial fibrillation, permanent ([place])   2. Complete heart block ([place])   3. Acquired thrombophilia ([place])   4. Pacemaker   5. OSA (obstructive sleep apnea)   6. Pure hypercholesterolemia   7. Essential hypertension        PLAN:  In order of problems listed above:  AFib: Permanent arrhythmia with complete heart block.  On appropriate anticoagulation.  CHADSVasc 3-5 (age 31, HTN, questionable TIA in 2002).  Anticoagulation: Has not had any falls, bleeding or neurological events. CHB: Pacemaker dependent. PPM: Original leads from 2009, current generator from 2018.  Continue remote downloads every 3 months. OSA: Reports 100% compliance with CPAP.  Does not have daytime hypersomnolence. HLP: Has statin myopathy and does not have history of CAD or PAD.Marland Kitchen  On Zetia monotherapy. HTN: Well-controlled.    Medication Adjustments/Labs and Tests Ordered: Current medicines are reviewed at length with the patient today.  Concerns regarding medicines  are outlined above.  Medication changes, Labs and Tests ordered  today are listed in the Patient Instructions below. Patient Instructions  Medication Instructions:  No changes *If you need a refill on your cardiac medications before your next appointment, please call your pharmacy*  Follow-Up: At [facility], you and your health needs are our priority.  As part of our continuing mission to provide you with exceptional heart care, we have created designated Provider Care Teams.  These Care Teams include your primary Cardiologist (physician) and Advanced Practice Providers (APPs -  Physician Assistants and Nurse Practitioners) who all work together to provide you with the care you need, when you need it.  We recommend signing up for the patient portal called "MyChart".  Sign up information is provided on this After Visit Summary.  MyChart is used to connect with patients for Virtual Visits (Telemedicine).  Patients are able to view lab/test results, encounter notes, upcoming appointments, etc.  Non-urgent messages can be sent to your provider as well.   To learn more about what you can do with MyChart, go to [email or web address].    Your next appointment:   1 year(s)  Provider:   Sanda Klein, MD       Signed, Dean Klein, MD  04/04/2022 6:10 PM    [facility] [place], [place], [place]  [address] Phone: [phone]; Fax: [phone]

## 2022-04-02 NOTE — Patient Instructions (Signed)
Medication Instructions:  No changes *If you need a refill on your cardiac medications before your next appointment, please call your pharmacy*  Follow-Up: At [facility], you and your health needs are our priority.  As part of our continuing mission to provide you with exceptional heart care, we have created designated Provider Care Teams.  These Care Teams include your primary Cardiologist (physician) and Advanced Practice Providers (APPs -  Physician Assistants and Nurse Practitioners) who all work together to provide you with the care you need, when you need it.  We recommend signing up for the patient portal called "MyChart".  Sign up information is provided on this After Visit Summary.  MyChart is used to connect with patients for Virtual Visits (Telemedicine).  Patients are able to view lab/test results, encounter notes, upcoming appointments, etc.  Non-urgent messages can be sent to your provider as well.   To learn more about what you can do with MyChart, go to [email or web address].    Your next appointment:   1 year(s)  Provider:   Mihai Croitoru, MD      

## 2022-04-04 ENCOUNTER — Encounter: Payer: Self-pay | Admitting: Cardiovascular Disease

## 2022-04-06 ENCOUNTER — Other Ambulatory Visit: Payer: Self-pay | Admitting: Cardiovascular Disease

## 2022-05-02 ENCOUNTER — Ambulatory Visit (INDEPENDENT_AMBULATORY_CARE_PROVIDER_SITE_OTHER): Payer: Medicare Other

## 2022-05-02 DIAGNOSIS — I442 Atrioventricular block, complete: Secondary | ICD-10-CM

## 2022-05-07 DIAGNOSIS — Z961 Presence of intraocular lens: Secondary | ICD-10-CM | POA: Diagnosis not present

## 2022-05-07 LAB — CUP PACEART REMOTE DEVICE CHECK
Battery Remaining Longevity: 73 mo
Battery Voltage: 2.97 V
Brady Statistic RV Percent Paced: 99.95 %
Date Time Interrogation Session: 20240428174004
Implantable Lead Connection Status: 753985
Implantable Lead Implant Date: 20090402
Implantable Lead Location: 753860
Implantable Lead Model: 5076
Implantable Pulse Generator Implant Date: 20180919
Lead Channel Impedance Value: 323 Ohm
Lead Channel Impedance Value: 361 Ohm
Lead Channel Pacing Threshold Amplitude: 1 V
Lead Channel Pacing Threshold Pulse Width: 0.4 ms
Lead Channel Sensing Intrinsic Amplitude: 9.75 mV
Lead Channel Sensing Intrinsic Amplitude: 9.75 mV
Lead Channel Setting Pacing Amplitude: 2.5 V
Lead Channel Setting Pacing Pulse Width: 0.4 ms
Lead Channel Setting Sensing Sensitivity: 4 mV
Zone Setting Status: 755011

## 2022-05-14 DIAGNOSIS — M109 Gout, unspecified: Secondary | ICD-10-CM | POA: Diagnosis not present

## 2022-05-14 DIAGNOSIS — E291 Testicular hypofunction: Secondary | ICD-10-CM | POA: Diagnosis not present

## 2022-05-14 DIAGNOSIS — Z125 Encounter for screening for malignant neoplasm of prostate: Secondary | ICD-10-CM | POA: Diagnosis not present

## 2022-05-14 DIAGNOSIS — E785 Hyperlipidemia, unspecified: Secondary | ICD-10-CM | POA: Diagnosis not present

## 2022-05-14 DIAGNOSIS — I10 Essential (primary) hypertension: Secondary | ICD-10-CM | POA: Diagnosis not present

## 2022-05-14 DIAGNOSIS — R7989 Other specified abnormal findings of blood chemistry: Secondary | ICD-10-CM | POA: Diagnosis not present

## 2022-05-21 DIAGNOSIS — Z1339 Encounter for screening examination for other mental health and behavioral disorders: Secondary | ICD-10-CM | POA: Diagnosis not present

## 2022-05-21 DIAGNOSIS — G720 Drug-induced myopathy: Secondary | ICD-10-CM | POA: Diagnosis not present

## 2022-05-21 DIAGNOSIS — I129 Hypertensive chronic kidney disease with stage 1 through stage 4 chronic kidney disease, or unspecified chronic kidney disease: Secondary | ICD-10-CM | POA: Diagnosis not present

## 2022-05-21 DIAGNOSIS — Z1331 Encounter for screening for depression: Secondary | ICD-10-CM | POA: Diagnosis not present

## 2022-05-21 DIAGNOSIS — R82998 Other abnormal findings in urine: Secondary | ICD-10-CM | POA: Diagnosis not present

## 2022-05-21 DIAGNOSIS — Z0000 Encounter for general adult medical examination without abnormal findings: Secondary | ICD-10-CM | POA: Diagnosis not present

## 2022-05-21 DIAGNOSIS — G300 Alzheimer's disease with early onset: Secondary | ICD-10-CM | POA: Diagnosis not present

## 2022-05-21 DIAGNOSIS — D6869 Other thrombophilia: Secondary | ICD-10-CM | POA: Diagnosis not present

## 2022-05-21 DIAGNOSIS — E669 Obesity, unspecified: Secondary | ICD-10-CM | POA: Diagnosis not present

## 2022-05-21 DIAGNOSIS — N1831 Chronic kidney disease, stage 3a: Secondary | ICD-10-CM | POA: Diagnosis not present

## 2022-05-21 DIAGNOSIS — F02B0 Dementia in other diseases classified elsewhere, moderate, without behavioral disturbance, psychotic disturbance, mood disturbance, and anxiety: Secondary | ICD-10-CM | POA: Diagnosis not present

## 2022-05-21 DIAGNOSIS — E785 Hyperlipidemia, unspecified: Secondary | ICD-10-CM | POA: Diagnosis not present

## 2022-05-21 DIAGNOSIS — I480 Paroxysmal atrial fibrillation: Secondary | ICD-10-CM | POA: Diagnosis not present

## 2022-05-29 ENCOUNTER — Other Ambulatory Visit: Payer: Self-pay | Admitting: Cardiovascular Disease

## 2022-05-29 NOTE — Progress Notes (Signed)
Remote pacemaker transmission.   

## 2022-07-31 DIAGNOSIS — C44722 Squamous cell carcinoma of skin of right lower limb, including hip: Secondary | ICD-10-CM | POA: Diagnosis not present

## 2022-07-31 DIAGNOSIS — L570 Actinic keratosis: Secondary | ICD-10-CM | POA: Diagnosis not present

## 2022-07-31 DIAGNOSIS — L821 Other seborrheic keratosis: Secondary | ICD-10-CM | POA: Diagnosis not present

## 2022-07-31 DIAGNOSIS — D485 Neoplasm of uncertain behavior of skin: Secondary | ICD-10-CM | POA: Diagnosis not present

## 2022-08-01 ENCOUNTER — Ambulatory Visit (INDEPENDENT_AMBULATORY_CARE_PROVIDER_SITE_OTHER): Payer: Medicare Other

## 2022-08-01 DIAGNOSIS — I442 Atrioventricular block, complete: Secondary | ICD-10-CM | POA: Diagnosis not present

## 2022-08-02 LAB — CUP PACEART REMOTE DEVICE CHECK
Battery Remaining Longevity: 70 mo
Battery Voltage: 2.97 V
Brady Statistic RV Percent Paced: 99.95 %
Date Time Interrogation Session: 20240723224435
Implantable Lead Connection Status: 753985
Implantable Lead Implant Date: 20090402
Implantable Lead Location: 753860
Implantable Lead Model: 5076
Implantable Pulse Generator Implant Date: 20180919
Lead Channel Impedance Value: 342 Ohm
Lead Channel Impedance Value: 361 Ohm
Lead Channel Pacing Threshold Amplitude: 1 V
Lead Channel Pacing Threshold Pulse Width: 0.4 ms
Lead Channel Sensing Intrinsic Amplitude: 9.75 mV
Lead Channel Sensing Intrinsic Amplitude: 9.75 mV
Lead Channel Setting Pacing Pulse Width: 0.4 ms

## 2022-08-14 NOTE — Progress Notes (Signed)
Remote pacemaker transmission.   

## 2022-08-29 DIAGNOSIS — L570 Actinic keratosis: Secondary | ICD-10-CM | POA: Diagnosis not present

## 2022-08-29 DIAGNOSIS — C44722 Squamous cell carcinoma of skin of right lower limb, including hip: Secondary | ICD-10-CM | POA: Diagnosis not present

## 2022-08-29 DIAGNOSIS — L905 Scar conditions and fibrosis of skin: Secondary | ICD-10-CM | POA: Diagnosis not present

## 2022-08-29 DIAGNOSIS — D485 Neoplasm of uncertain behavior of skin: Secondary | ICD-10-CM | POA: Diagnosis not present

## 2022-10-13 DIAGNOSIS — Z23 Encounter for immunization: Secondary | ICD-10-CM | POA: Diagnosis not present

## 2022-10-31 ENCOUNTER — Ambulatory Visit (INDEPENDENT_AMBULATORY_CARE_PROVIDER_SITE_OTHER): Payer: Medicare Other

## 2022-10-31 DIAGNOSIS — I442 Atrioventricular block, complete: Secondary | ICD-10-CM

## 2022-10-31 DIAGNOSIS — I4821 Permanent atrial fibrillation: Secondary | ICD-10-CM

## 2022-11-01 LAB — CUP PACEART REMOTE DEVICE CHECK
Battery Remaining Longevity: 66 mo
Battery Voltage: 2.97 V
Brady Statistic RV Percent Paced: 99.98 %
Date Time Interrogation Session: 20241023004434
Implantable Lead Connection Status: 753985
Implantable Lead Implant Date: 20090402
Implantable Lead Location: 753860
Implantable Lead Model: 5076
Implantable Pulse Generator Implant Date: 20180919
Lead Channel Impedance Value: 342 Ohm
Lead Channel Impedance Value: 361 Ohm
Lead Channel Pacing Threshold Amplitude: 0.875 V
Lead Channel Pacing Threshold Pulse Width: 0.4 ms
Lead Channel Sensing Intrinsic Amplitude: 9.75 mV
Lead Channel Sensing Intrinsic Amplitude: 9.75 mV
Lead Channel Setting Pacing Amplitude: 2.5 V
Lead Channel Setting Pacing Pulse Width: 0.4 ms
Lead Channel Setting Sensing Sensitivity: 4 mV
Zone Setting Status: 755011

## 2022-11-19 NOTE — Progress Notes (Signed)
Remote pacemaker transmission.   

## 2023-01-30 ENCOUNTER — Ambulatory Visit: Payer: Medicare Other

## 2023-01-30 DIAGNOSIS — I442 Atrioventricular block, complete: Secondary | ICD-10-CM | POA: Diagnosis not present

## 2023-01-30 LAB — CUP PACEART REMOTE DEVICE CHECK
Battery Remaining Longevity: 62 mo
Battery Voltage: 2.96 V
Brady Statistic RV Percent Paced: 99.88 %
Date Time Interrogation Session: 20250122004241
Implantable Lead Connection Status: 753985
Implantable Lead Implant Date: 20090402
Implantable Lead Location: 753860
Implantable Lead Model: 5076
Implantable Pulse Generator Implant Date: 20180919
Lead Channel Impedance Value: 323 Ohm
Lead Channel Impedance Value: 361 Ohm
Lead Channel Pacing Threshold Amplitude: 1.125 V
Lead Channel Pacing Threshold Pulse Width: 0.4 ms
Lead Channel Sensing Intrinsic Amplitude: 9.75 mV
Lead Channel Sensing Intrinsic Amplitude: 9.75 mV
Lead Channel Setting Pacing Amplitude: 2.5 V
Lead Channel Setting Pacing Pulse Width: 0.4 ms
Lead Channel Setting Sensing Sensitivity: 4 mV
Zone Setting Status: 755011

## 2023-01-31 ENCOUNTER — Encounter: Payer: Self-pay | Admitting: Cardiovascular Disease

## 2023-02-14 ENCOUNTER — Ambulatory Visit: Payer: Medicare Other | Admitting: Internal Medicine

## 2023-02-21 DIAGNOSIS — H31001 Unspecified chorioretinal scars, right eye: Secondary | ICD-10-CM | POA: Diagnosis not present

## 2023-02-21 DIAGNOSIS — H04123 Dry eye syndrome of bilateral lacrimal glands: Secondary | ICD-10-CM | POA: Diagnosis not present

## 2023-02-21 DIAGNOSIS — H11002 Unspecified pterygium of left eye: Secondary | ICD-10-CM | POA: Diagnosis not present

## 2023-02-21 DIAGNOSIS — H43813 Vitreous degeneration, bilateral: Secondary | ICD-10-CM | POA: Diagnosis not present

## 2023-02-21 DIAGNOSIS — H26493 Other secondary cataract, bilateral: Secondary | ICD-10-CM | POA: Diagnosis not present

## 2023-02-23 NOTE — Progress Notes (Unsigned)
 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  He and his wife both considered that he is doing very well.  Machine was replaced last year and is functioning well.  He feels he is getting enough sleep. Cardiology continues to follow for his pacemaker and history of atrial fibrillation.  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  History of Present Illness   The patient, with a history of sleep apnea and cardiac disease managed with a pacemaker, presents for a routine follow-up. He reports no new concerns or symptoms. He continues to use his CPAP machine nightly, which is set to range between five and fifteen centimeters of water pressure. The machine spends most of its time between eight and about thirteen. The patient reports comfort with the machine and is achieving less than four breakthrough apneas an hour. He denies any complaints about the CPAP machine at home.  From a cardiac standpoint, the patient reports no new symptoms or  concerns. He also mentions no new symptoms or concerns related to his other medical conditions.     ROS-see HPI              + = positive Constitutional:   No-   weight loss, night sweats, fevers, chills, fatigue, lassitude. HEENT:   No-  headaches, difficulty swallowing, tooth/dental problems, sore throat,       No-  sneezing, itching, ear ache, +nasal congestion, post nasal drip,  CV:  No-   chest pain, orthopnea, PND, swelling in lower extremities, anasarca,                                                       dizziness, palpitations Resp: No-   shortness of breath with exertion or at rest.              productive cough,  No non-productive cough,  No- coughing up of blood.              No-   change in color of mucus.  No- wheezing.   Skin: No-   rash or lesions. GI:  No-   heartburn, indigestion, abdominal pain, nausea, vomiting, diarrhea,                 change in bowel habits, loss of appetite GU: No-   dysuria, change in color of urine, no urgency or frequency.  No- flank pain. MS:  No-   joint pain or swelling.  No- decreased range of motion.  No- back  pain. Neuro-     nothing unusual Psych:  No- change in mood or affect. No depression or anxiety.  No memory loss.  OBJ- Physical Exam    Medium build, not obese General- Alert, Oriented, Affect-appropriate, Distress- none acute Skin- rash-none, lesions- none, excoriation- none.  Lymphadenopathy- none Head- atraumatic            Eyes- Gross vision intact, PERRLA, conjunctivae and secretions clear            Ears- Hearing, canals-normal            Nose- Clear, no-Septal dev, mucus, polyps, erosion, + perforation             Throat- Mallampati II-III , mucosa clear , drainage- none, tonsils- atrophic Neck- flexible , trachea midline, no stridor , thyroid nl, carotid no bruit Chest - symmetrical excursion , unlabored           Heart/CV- RRR-feels regular , no murmur , no gallop  , no rub, nl s1 s2                           - JVD-  none , edema- none, stasis changes- none, varices- none           Lung- clear to P&A, wheeze- none, cough- none , dullness-none, rub- none           Chest wall- +pacemaker R Abd- tender-no, distended-no, bowel sounds-present, HSM- no Br/ Gen/ Rectal- Not done, not indicated Extrem- cyanosis- none, clubbing, none, atrophy- none, strength- nl Neuro- grossly intact to observation  Assessment and Plan    Obstructive Sleep Apnea Patient is comfortable with CPAP and using it nightly. Machine is set to range between 5 and 15 cm of water pressure, with less than 4 breakthrough apneas per hour. No complaints reported. -Continue current CPAP settings and usage.  Cardiac Status with Pacemaker No new cardiac issues reported. -Continue current cardiac management.  No new concerns or changes in health status reported. No changes to current management plan.

## 2023-02-25 ENCOUNTER — Encounter: Payer: Self-pay | Admitting: Internal Medicine

## 2023-02-25 ENCOUNTER — Ambulatory Visit (INDEPENDENT_AMBULATORY_CARE_PROVIDER_SITE_OTHER): Payer: Medicare Other | Admitting: Internal Medicine

## 2023-02-25 VITALS — BP 116/68 | HR 88 | Temp 97.7°F | Ht 68.0 in | Wt 195.0 lb

## 2023-02-25 DIAGNOSIS — G4733 Obstructive sleep apnea (adult) (pediatric): Secondary | ICD-10-CM | POA: Diagnosis not present

## 2023-02-25 NOTE — Patient Instructions (Signed)
 We can  continue CPAP auto 5-15  You are doing great!- please reach out if we can help

## 2023-03-14 NOTE — Progress Notes (Signed)
 Remote pacemaker transmission.

## 2023-03-18 DIAGNOSIS — Z85828 Personal history of other malignant neoplasm of skin: Secondary | ICD-10-CM | POA: Diagnosis not present

## 2023-03-18 DIAGNOSIS — L820 Inflamed seborrheic keratosis: Secondary | ICD-10-CM | POA: Diagnosis not present

## 2023-03-18 DIAGNOSIS — L814 Other melanin hyperpigmentation: Secondary | ICD-10-CM | POA: Diagnosis not present

## 2023-03-18 DIAGNOSIS — D225 Melanocytic nevi of trunk: Secondary | ICD-10-CM | POA: Diagnosis not present

## 2023-03-18 DIAGNOSIS — L578 Other skin changes due to chronic exposure to nonionizing radiation: Secondary | ICD-10-CM | POA: Diagnosis not present

## 2023-03-18 DIAGNOSIS — L821 Other seborrheic keratosis: Secondary | ICD-10-CM | POA: Diagnosis not present

## 2023-03-18 DIAGNOSIS — L570 Actinic keratosis: Secondary | ICD-10-CM | POA: Diagnosis not present

## 2023-03-26 ENCOUNTER — Other Ambulatory Visit: Payer: Self-pay | Admitting: Cardiovascular Disease

## 2023-03-28 ENCOUNTER — Ambulatory Visit: Payer: Medicare Other | Attending: Cardiovascular Disease | Admitting: Cardiovascular Disease

## 2023-03-28 ENCOUNTER — Encounter: Payer: Self-pay | Admitting: Cardiovascular Disease

## 2023-03-28 VITALS — BP 106/64 | HR 69 | Ht 68.0 in | Wt 193.4 lb

## 2023-03-28 DIAGNOSIS — D6869 Other thrombophilia: Secondary | ICD-10-CM | POA: Insufficient documentation

## 2023-03-28 DIAGNOSIS — I10 Essential (primary) hypertension: Secondary | ICD-10-CM | POA: Insufficient documentation

## 2023-03-28 DIAGNOSIS — I442 Atrioventricular block, complete: Secondary | ICD-10-CM | POA: Insufficient documentation

## 2023-03-28 DIAGNOSIS — Z950 Presence of cardiac pacemaker: Secondary | ICD-10-CM | POA: Diagnosis not present

## 2023-03-28 DIAGNOSIS — I4821 Permanent atrial fibrillation: Secondary | ICD-10-CM | POA: Insufficient documentation

## 2023-03-28 DIAGNOSIS — E7800 Pure hypercholesterolemia, unspecified: Secondary | ICD-10-CM | POA: Diagnosis not present

## 2023-03-28 DIAGNOSIS — G4733 Obstructive sleep apnea (adult) (pediatric): Secondary | ICD-10-CM | POA: Insufficient documentation

## 2023-03-28 NOTE — Progress Notes (Signed)
 Cardiology Office Note    Date:  03/28/2023   ID:  Dean Morgan, [name], DOB [birth year], MRN [id number]  PCP:  Cleatis Polka., MD  Cardiologist:   Thurmon Fair, MD   No chief complaint on file.   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 male with complete heart block and permanent atrial fibrillation here for pacemaker follow-up. He also has obstructive sleep apnea, hypertension and hyperlipidemia, but does not have known coronary disease or other structural heart problems.  He continues to feel quite well.  Has short-term memory problems but seems to be reasonably well oriented and very comfortable and content.  His wife accompanies him as always.  Occasionally has sundowning especially if he is not in his own home.  The patient specifically denies any chest pain at rest or with exertion, dyspnea at rest or with exertion, orthopnea, paroxysmal nocturnal dyspnea, syncope, palpitations, focal neurological deficits, intermittent claudication, lower extremity edema, unexplained weight gain, cough, hemoptysis or wheezing.  Bleeds easily if he scratches himself, but has not had any serious bleeding problems or falls.   Comprehensive pacemaker check is performed in the office today.  His Medtronic Azure XT device implanted 2018 (original lead from 2009) has an estimated 5 years remaining longevity.  He lead parameters including sensing, impedance and capture threshold are all excellent.  He has 99.9% ventricular pacing and has no escape rhythm.  Background atrial fibrillation.  He has not had any episodes of high ventricular rates.  His heart rate histogram distribution is appropriate.  Activity level is fairly constant at roughly 2 hours a day.    Past Medical History:  Diagnosis Date   Arthritis    Atrial fibrillation (HCC)    Dysrhythmia    AF   Hyperlipemia    Hypertension    Memory difficulty 12/16/2017   Pacemaker    Pacemaker    Sinus bradycardia  seen on cardiac monitor    Sleep apnea    does use his cpap   Wears glasses    Wears hearing aid     Past Surgical History:  Procedure Laterality Date   CARDIOVERSION     multiple times-pacer put in 2009   COLONOSCOPY     I & D EXTREMITY Left 04/20/2013   Procedure: LEFT HAND WOUND DEBRIDEMENT AND CLOSURE ;  Surgeon: Jodi Marble, MD;  Location: [facility];  Service: Orthopedics;  Laterality: Left;   KNEE ARTHROSCOPY  2/10   left   LAPAROSCOPIC APPENDECTOMY N/A 07/14/2020   Procedure: APPENDECTOMY LAPAROSCOPIC;  Surgeon: Berna Bue, MD;  Location: [facility] OR;  Service: General;  Laterality: N/A;   MASS EXCISION  01/29/2012   Procedure: EXCISION MASS;  Surgeon: Velna Ochs, MD;  Location: [facility];  Service: Orthopedics;  Laterality: Left;  excision of bone spur left hand   METACARPAL OSTEOTOMY Left 01/13/2013   Procedure: LEFT INDEX FINGER MCP RADIAL COLLATERAL REPAIR/RECONSTRUCTION VS METACARPAL ROTATIONAL OSTEOTOMY;  Surgeon: Jodi Marble, MD;  Location:  SURGERY CENTER;  Service: Orthopedics;  Laterality: Left;   PACEMAKER INSERTION  2009   PPM GENERATOR CHANGEOUT N/A 09/26/2016   Procedure: PPM Generator Changeout;  Surgeon: Thurmon Fair, MD;  Location: [facility];  Service: Cardiovascular;  Laterality: N/A;   SHOULDER ARTHROSCOPY  2004   left   THUMB ARTHROSCOPY  2007   rt thunb mass   TONSILLECTOMY      Current  Medications: Outpatient Medications Prior to Visit  Medication Sig Dispense Refill   acetaminophen (TYLENOL) 325 MG tablet Take 650 mg by mouth every 6 (six) hours as needed for mild pain.     allopurinol (ZYLOPRIM) 100 MG tablet Take 100 mg by mouth daily.     amLODipine (NORVASC) 5 MG tablet TAKE 1 TABLET BY MOUTH EVERY DAY (Patient taking differently: Take 5 mg by mouth daily.) 90 tablet 3   apixaban (ELIQUIS) 5 MG TABS tablet Take 1 tablet (5 mg total) by mouth 2 (two) times daily.     Arginine 1000  MG TABS Take 1,000 mg by mouth daily.      aspirin EC 81 MG tablet Take 81 mg by mouth at bedtime as needed for mild pain.     chlorthalidone (HYGROTON) 25 MG tablet TAKE 1 TABLET BY MOUTH EVERY DAY 90 tablet 3   Cholecalciferol (VITAMIN D3) 5000 UNITS TABS Take 5,000 Units by mouth daily.      colchicine 0.6 MG tablet Take 0.6 mg by mouth daily as needed (gout flares).     donepezil (ARICEPT) 10 MG tablet Take 1 tablet (10 mg total) by mouth every morning. 30 tablet 11   ezetimibe (ZETIA) 10 MG tablet Take 1 tablet (10 mg total) by mouth daily. (Patient taking differently: Take 10 mg by mouth daily with breakfast.) 28 tablet 0   fish oil-omega-3 fatty acids 1000 MG capsule Take 1 g by mouth 2 (two) times daily.      memantine (NAMENDA) 10 MG tablet Take 1 tablet (10 mg total) by mouth 2 (two) times daily. 60 tablet 11   Nebivolol HCl 20 MG TABS TAKE 2 TABLETS BY MOUTH EVERY DAY 60 tablet 0   niacin (SLO-NIACIN) 500 MG tablet Take 500 mg by mouth 2 (two) times daily with a meal.     olmesartan (BENICAR) 40 MG tablet Take 40 mg by mouth daily.     potassium chloride SA (KLOR-CON M) 20 MEQ tablet TAKE 1 TABLET BY MOUTH EVERY DAY 30 tablet 0   traMADol (ULTRAM) 50 MG tablet      allopurinol (ZYLOPRIM) 300 MG tablet Take 300 mg by mouth daily. (Patient not taking: Reported on 03/28/2023)     Glucosamine-Chondroitin (COSAMIN DS PO) Take 1 capsule by mouth 2 (two) times daily with a meal.     No facility-administered medications prior to visit.     Allergies:   Rosuvastatin, Lipitor [atorvastatin], Penicillins, and Vytorin [ezetimibe-simvastatin]   Family History:  The patient's family history includes Dementia in his mother; Diabetes in his brother and maternal grandmother; Emphysema in an other family member; Heart disease in an other family member; Hypertension in his brother; Stroke in his father and paternal grandfather.   ROS:   Please see the history of present illness.   All other systems  are reviewed and are negative.   PHYSICAL EXAM:   VS:  BP 106/64 (BP Location: Left Arm, Patient Position: Sitting, Cuff Size: Normal)   Pulse 69   Ht 5\' 8"  (1.727 m)   Wt 193 lb 6.4 oz (87.7 kg)   SpO2 96%   BMI 29.41 kg/m      General: Alert, oriented x3, no distress, healthy left subclavian pacemaker site Head: no evidence of trauma, PERRL, EOMI, no exophtalmos or lid lag, no myxedema, no xanthelasma; normal ears, nose and oropharynx Neck: normal jugular venous pulsations and no hepatojugular reflux; brisk carotid pulses without delay and no carotid bruits Chest: clear to  auscultation, no signs of consolidation by percussion or palpation, normal fremitus, symmetrical and full respiratory excursions Cardiovascular: normal position and quality of the apical impulse, regular rhythm, normal first and second heart sounds, no murmurs, rubs or gallops Abdomen: no tenderness or distention, no masses by palpation, no abnormal pulsatility or arterial bruits, normal bowel sounds, no hepatosplenomegaly Extremities: no clubbing, cyanosis or edema; 2+ radial, ulnar and brachial pulses bilaterally; 2+ right femoral, posterior tibial and dorsalis pedis pulses; 2+ left femoral, posterior tibial and dorsalis pedis pulses; no subclavian or femoral bruits Neurological: grossly nonfocal Psych: Normal mood and affect      Wt Readings from Last 3 Encounters:  03/28/23 193 lb 6.4 oz (87.7 kg)  02/25/23 195 lb (88.5 kg)  04/02/22 196 lb 12.8 oz (89.3 kg)     Studies/Labs Reviewed:   EKG:    EKG Interpretation Date/Time:  Thursday March 28 2023 08:07:37 EDT Ventricular Rate:  69 PR Interval:    QRS Duration:  184 QT Interval:  472 QTC Calculation: 505 R Axis:   -72  Text Interpretation: Ventricular-paced rhythm with occasional Premature ventricular complexes When compared with ECG of 13-Jul-2020 19:34, No significant change since last tracing Confirmed by Kamirah Shugrue ([id number]) on 03/28/2023  8:15:38 AM        Lipid Panel    Component Value Date/Time   CHOL 164 06/10/2012 0815   TRIG 94 06/10/2012 0815   HDL 64 06/10/2012 0815   CHOLHDL 2.6 06/10/2012 0815   VLDL 19 06/10/2012 0815   LDLCALC 81 06/10/2012 0815   03/18/2018 Chol 207, HDL 52, LDL 125, TG 150 Creat 1.5, Hgb 13.8  03/25/2019 Total cholesterol 206, HDL 53, LDL 123, triglycerides 152 TSH 3.65, potassium 3.9  10/19/2019 Hemoglobin 12.5, creatinine 1.38, normal liver function tests  04/05/2020 Cholesterol 212, HDL 48, LDL 142, triglycerides 112 TSH 3.69  12/05/2020 creatinine 1.32, hemoglobin 14.0, ALT 11  05/09/2021 Cholesterol 203, HDL 44, LDL 133, triglycerides 131 ALT 16, TSH 4.83, potassium 3.8  02/07/2022 Hemoglobin 14, creatinine 1.35  05/14/2022 Cholesterol 194, HDL 43, LDL 125, triglycerides 131 Potassium 3.7, ALT 8, TSH 2.59     ASSESSMENT:    1. Atrial fibrillation, permanent (HCC)   2. Acquired thrombophilia (HCC)   3. CHB (complete heart block) (HCC)   4. Pacemaker   5. Essential hypertension   6. OSA (obstructive sleep apnea)   7. Pure hypercholesterolemia        PLAN:  In order of problems listed above:  AFib: Has permanent atrial fibrillation with complete heart block.  On appropriate anticoagulation.  CHADSVasc 3-5 (age 31, HTN, questionable TIA in 2002).  Anticoagulation: No serious bleeding complications. CHB: He is pacemaker dependent. PPM: June these are from 2009 and current generator is from 2018.  Compliant with remote downloads every 3 months. OSA: Uses PAP every night and denies daytime hypersomnolence. HLP: Moderately elevated LDL cholesterol on Zetia monotherapy since he has a history of statin myopathy.  Does not have known CAD or PAD HTN: Well-controlled.  Continue current medications (amlodipine, chlorthalidone, olmesartan, Bystolic).    Medication Adjustments/Labs and Tests Ordered: Current medicines are reviewed at length with the patient  today.  Concerns regarding medicines are outlined above.  Medication changes, Labs and Tests ordered today are listed in the Patient Instructions below. Patient Instructions  Medication Instructions:  Your physician recommends that you continue on your current medications as directed. Please refer to the Current Medication list given to you today.  Follow-Up: At [facility], you and your health needs are our priority.  As part of our continuing mission to provide you with exceptional heart care, we have created designated Provider Care Teams.  These Care Teams include your primary Cardiologist (physician) and Advanced Practice Providers (APPs -  Physician Assistants and Nurse Practitioners) who all work together to provide you with the care you need, when you need it.  We recommend signing up for the patient portal called "MyChart".  Sign up information is provided on this After Visit Summary.  MyChart is used to connect with patients for Virtual Visits (Telemedicine).  Patients are able to view lab/test results, encounter notes, upcoming appointments, etc.  Non-urgent messages can be sent to your provider as well.   To learn more about what you can do with MyChart, go to [email or web address].    Your next appointment:   1 year with Dr. Royann Shivers      1st Floor: - Lobby - Registration  - Pharmacy  - Lab - Cafe  2nd Floor: - PV Lab - Diagnostic Testing (echo, CT, nuclear med)  3rd Floor: - Vacant  4th Floor: - TCTS (cardiothoracic surgery) - AFib Clinic - Structural Heart Clinic - Vascular Surgery  - Vascular Ultrasound  5th Floor: - HeartCare Cardiology (general and EP) - Clinical Pharmacy for coumadin, hypertension, lipid, weight-loss medications, and med management appointments    Valet parking services will be available as well.     Signed, Thurmon Fair, MD  03/28/2023 8:28 AM    [facility] [address] [place], [place], [place]   [address] Phone: [phone]; Fax: [phone]

## 2023-03-28 NOTE — Patient Instructions (Addendum)
 Medication Instructions:  Your physician recommends that you continue on your current medications as directed. Please refer to the Current Medication list given to you today.  Follow-Up: At [facility], you and your health needs are our priority.  As part of our continuing mission to provide you with exceptional heart care, we have created designated Provider Care Teams.  These Care Teams include your primary Cardiologist (physician) and Advanced Practice Providers (APPs -  Physician Assistants and Nurse Practitioners) who all work together to provide you with the care you need, when you need it.  We recommend signing up for the patient portal called "MyChart".  Sign up information is provided on this After Visit Summary.  MyChart is used to connect with patients for Virtual Visits (Telemedicine).  Patients are able to view lab/test results, encounter notes, upcoming appointments, etc.  Non-urgent messages can be sent to your provider as well.   To learn more about what you can do with MyChart, go to [email or web address].    Your next appointment:   1 year with Dr. Royann Shivers      1st Floor: - Lobby - Registration  - Pharmacy  - Lab - Cafe  2nd Floor: - PV Lab - Diagnostic Testing (echo, CT, nuclear med)  3rd Floor: - Vacant  4th Floor: - TCTS (cardiothoracic surgery) - AFib Clinic - Structural Heart Clinic - Vascular Surgery  - Vascular Ultrasound  5th Floor: - HeartCare Cardiology (general and EP) - Clinical Pharmacy for coumadin, hypertension, lipid, weight-loss medications, and med management appointments    Valet parking services will be available as well.

## 2023-05-01 ENCOUNTER — Ambulatory Visit (INDEPENDENT_AMBULATORY_CARE_PROVIDER_SITE_OTHER): Payer: Medicare Other

## 2023-05-01 DIAGNOSIS — I442 Atrioventricular block, complete: Secondary | ICD-10-CM | POA: Diagnosis not present

## 2023-05-02 LAB — CUP PACEART REMOTE DEVICE CHECK
Battery Remaining Longevity: 59 mo
Battery Voltage: 2.96 V
Brady Statistic RV Percent Paced: 99.65 %
Date Time Interrogation Session: 20250422231258
Implantable Lead Connection Status: 753985
Implantable Lead Implant Date: 20090402
Implantable Lead Location: 753860
Implantable Lead Model: 5076
Implantable Pulse Generator Implant Date: 20180919
Lead Channel Impedance Value: 323 Ohm
Lead Channel Impedance Value: 361 Ohm
Lead Channel Pacing Threshold Amplitude: 1 V
Lead Channel Pacing Threshold Pulse Width: 0.4 ms
Lead Channel Sensing Intrinsic Amplitude: 9.75 mV
Lead Channel Sensing Intrinsic Amplitude: 9.75 mV
Lead Channel Setting Pacing Amplitude: 2.5 V
Lead Channel Setting Pacing Pulse Width: 0.4 ms
Lead Channel Setting Sensing Sensitivity: 4 mV
Zone Setting Status: 755011

## 2023-05-03 ENCOUNTER — Encounter: Payer: Self-pay | Admitting: Cardiovascular Disease

## 2023-05-16 DIAGNOSIS — D485 Neoplasm of uncertain behavior of skin: Secondary | ICD-10-CM | POA: Diagnosis not present

## 2023-05-16 DIAGNOSIS — L570 Actinic keratosis: Secondary | ICD-10-CM | POA: Diagnosis not present

## 2023-05-16 DIAGNOSIS — C44329 Squamous cell carcinoma of skin of other parts of face: Secondary | ICD-10-CM | POA: Diagnosis not present

## 2023-05-30 ENCOUNTER — Other Ambulatory Visit: Payer: Self-pay | Admitting: Cardiovascular Disease

## 2023-06-12 DIAGNOSIS — E785 Hyperlipidemia, unspecified: Secondary | ICD-10-CM | POA: Diagnosis not present

## 2023-06-12 DIAGNOSIS — E291 Testicular hypofunction: Secondary | ICD-10-CM | POA: Diagnosis not present

## 2023-06-12 DIAGNOSIS — Z7901 Long term (current) use of anticoagulants: Secondary | ICD-10-CM | POA: Diagnosis not present

## 2023-06-12 DIAGNOSIS — N1831 Chronic kidney disease, stage 3a: Secondary | ICD-10-CM | POA: Diagnosis not present

## 2023-06-12 DIAGNOSIS — M109 Gout, unspecified: Secondary | ICD-10-CM | POA: Diagnosis not present

## 2023-06-12 DIAGNOSIS — I480 Paroxysmal atrial fibrillation: Secondary | ICD-10-CM | POA: Diagnosis not present

## 2023-06-12 DIAGNOSIS — I129 Hypertensive chronic kidney disease with stage 1 through stage 4 chronic kidney disease, or unspecified chronic kidney disease: Secondary | ICD-10-CM | POA: Diagnosis not present

## 2023-06-13 DIAGNOSIS — C44329 Squamous cell carcinoma of skin of other parts of face: Secondary | ICD-10-CM | POA: Diagnosis not present

## 2023-06-13 NOTE — Progress Notes (Signed)
 Remote pacemaker transmission.

## 2023-06-13 NOTE — Addendum Note (Signed)
 Addended by: Lott Rouleau A on: 06/13/2023 10:30 AM   Modules accepted: Orders

## 2023-06-18 DIAGNOSIS — G300 Alzheimer's disease with early onset: Secondary | ICD-10-CM | POA: Diagnosis not present

## 2023-06-18 DIAGNOSIS — R82998 Other abnormal findings in urine: Secondary | ICD-10-CM | POA: Diagnosis not present

## 2023-06-18 DIAGNOSIS — F02B0 Dementia in other diseases classified elsewhere, moderate, without behavioral disturbance, psychotic disturbance, mood disturbance, and anxiety: Secondary | ICD-10-CM | POA: Diagnosis not present

## 2023-06-18 DIAGNOSIS — G4733 Obstructive sleep apnea (adult) (pediatric): Secondary | ICD-10-CM | POA: Diagnosis not present

## 2023-06-18 DIAGNOSIS — N1831 Chronic kidney disease, stage 3a: Secondary | ICD-10-CM | POA: Diagnosis not present

## 2023-06-18 DIAGNOSIS — E669 Obesity, unspecified: Secondary | ICD-10-CM | POA: Diagnosis not present

## 2023-06-18 DIAGNOSIS — M109 Gout, unspecified: Secondary | ICD-10-CM | POA: Diagnosis not present

## 2023-06-18 DIAGNOSIS — Z1339 Encounter for screening examination for other mental health and behavioral disorders: Secondary | ICD-10-CM | POA: Diagnosis not present

## 2023-06-18 DIAGNOSIS — I480 Paroxysmal atrial fibrillation: Secondary | ICD-10-CM | POA: Diagnosis not present

## 2023-06-18 DIAGNOSIS — Z0000 Encounter for general adult medical examination without abnormal findings: Secondary | ICD-10-CM | POA: Diagnosis not present

## 2023-06-18 DIAGNOSIS — I129 Hypertensive chronic kidney disease with stage 1 through stage 4 chronic kidney disease, or unspecified chronic kidney disease: Secondary | ICD-10-CM | POA: Diagnosis not present

## 2023-06-18 DIAGNOSIS — E785 Hyperlipidemia, unspecified: Secondary | ICD-10-CM | POA: Diagnosis not present

## 2023-06-18 DIAGNOSIS — G720 Drug-induced myopathy: Secondary | ICD-10-CM | POA: Diagnosis not present

## 2023-06-18 DIAGNOSIS — Z1331 Encounter for screening for depression: Secondary | ICD-10-CM | POA: Diagnosis not present

## 2023-06-18 DIAGNOSIS — Z23 Encounter for immunization: Secondary | ICD-10-CM | POA: Diagnosis not present

## 2023-06-25 ENCOUNTER — Other Ambulatory Visit: Payer: Self-pay | Admitting: Cardiovascular Disease

## 2023-07-30 ENCOUNTER — Telehealth: Payer: Self-pay

## 2023-07-30 NOTE — Telephone Encounter (Signed)
 The pt [place]. [place]   [address] [place], [place] [address].  I tried to order the pt monitor with this address, however the [employer] website would not let me use this address.   I advised the pt wife to come to the [place] office when he get in town to get a monitor.

## 2023-07-31 ENCOUNTER — Ambulatory Visit: Payer: Medicare Other

## 2023-09-23 ENCOUNTER — Other Ambulatory Visit: Payer: Self-pay | Admitting: Cardiovascular Disease

## 2023-09-24 ENCOUNTER — Other Ambulatory Visit: Payer: Self-pay | Admitting: Cardiovascular Disease

## 2023-10-30 ENCOUNTER — Ambulatory Visit: Payer: Medicare Other

## 2023-10-30 DIAGNOSIS — I442 Atrioventricular block, complete: Secondary | ICD-10-CM | POA: Diagnosis not present

## 2023-10-31 LAB — CUP PACEART REMOTE DEVICE CHECK
Battery Remaining Longevity: 54 mo
Battery Voltage: 2.95 V
Brady Statistic RV Percent Paced: 99.79 %
Date Time Interrogation Session: 20251021232343
Implantable Lead Connection Status: 753985
Implantable Lead Implant Date: 20090402
Implantable Lead Location: 753860
Implantable Lead Model: 5076
Implantable Pulse Generator Implant Date: 20180919
Lead Channel Impedance Value: 323 Ohm
Lead Channel Impedance Value: 361 Ohm
Lead Channel Pacing Threshold Amplitude: 1 V
Lead Channel Pacing Threshold Pulse Width: 0.4 ms
Lead Channel Sensing Intrinsic Amplitude: 9.75 mV
Lead Channel Sensing Intrinsic Amplitude: 9.75 mV
Lead Channel Setting Pacing Amplitude: 2.5 V
Lead Channel Setting Pacing Pulse Width: 0.4 ms
Lead Channel Setting Sensing Sensitivity: 4 mV
Zone Setting Status: 755011

## 2023-11-01 NOTE — Progress Notes (Signed)
 Remote PPM Transmission

## 2023-11-04 ENCOUNTER — Ambulatory Visit: Payer: Self-pay | Admitting: Cardiovascular Disease

## 2023-11-27 DIAGNOSIS — Z23 Encounter for immunization: Secondary | ICD-10-CM | POA: Diagnosis not present

## 2023-12-09 ENCOUNTER — Other Ambulatory Visit: Payer: Self-pay | Admitting: Cardiovascular Disease

## 2024-01-15 ENCOUNTER — Encounter: Payer: Self-pay | Admitting: Internal Medicine

## 2024-01-29 ENCOUNTER — Ambulatory Visit

## 2024-01-29 DIAGNOSIS — I442 Atrioventricular block, complete: Secondary | ICD-10-CM

## 2024-01-30 LAB — CUP PACEART REMOTE DEVICE CHECK
Battery Remaining Longevity: 51 mo
Battery Voltage: 2.95 V
Brady Statistic RV Percent Paced: 99.98 %
Date Time Interrogation Session: 20260121051045
Implantable Lead Connection Status: 753985
Implantable Lead Implant Date: 20090402
Implantable Lead Location: 753860
Implantable Lead Model: 5076
Implantable Pulse Generator Implant Date: 20180919
Lead Channel Impedance Value: 323 Ohm
Lead Channel Impedance Value: 361 Ohm
Lead Channel Pacing Threshold Amplitude: 1.125 V
Lead Channel Pacing Threshold Pulse Width: 0.4 ms
Lead Channel Sensing Intrinsic Amplitude: 9.75 mV
Lead Channel Sensing Intrinsic Amplitude: 9.75 mV
Lead Channel Setting Pacing Amplitude: 2.5 V
Lead Channel Setting Pacing Pulse Width: 0.4 ms
Lead Channel Setting Sensing Sensitivity: 4 mV
Zone Setting Status: 755011

## 2024-02-01 NOTE — Progress Notes (Signed)
 Remote PPM Transmission

## 2024-02-02 ENCOUNTER — Ambulatory Visit: Payer: Self-pay | Admitting: Cardiovascular Disease

## 2024-02-12 ENCOUNTER — Other Ambulatory Visit: Payer: Self-pay | Admitting: Cardiovascular Disease

## 2024-02-27 ENCOUNTER — Ambulatory Visit: Payer: Medicare Other | Admitting: Internal Medicine

## 2024-03-05 ENCOUNTER — Encounter

## 2024-04-29 ENCOUNTER — Ambulatory Visit

## 2024-07-29 ENCOUNTER — Ambulatory Visit

## 2024-10-28 ENCOUNTER — Ambulatory Visit

## 2025-01-27 ENCOUNTER — Ambulatory Visit
